# Patient Record
Sex: Female | Born: 1975 | Race: White | Hispanic: No | Smoking: Current every day smoker
Health system: Southern US, Community
[De-identification: ages and names within clinical notes are randomized; demographics above are authoritative.]

## PROBLEM LIST (undated history)

## (undated) VITALS — BP 113/76 | HR 108 | Temp 96.6°F | Resp 20 | Ht 62.0 in | Wt 150.0 lb

## (undated) DIAGNOSIS — Z87442 Personal history of urinary calculi: Secondary | ICD-10-CM

## (undated) DIAGNOSIS — F1011 Alcohol abuse, in remission: Secondary | ICD-10-CM

## (undated) DIAGNOSIS — F1111 Opioid abuse, in remission: Secondary | ICD-10-CM

## (undated) DIAGNOSIS — M545 Low back pain, unspecified: Secondary | ICD-10-CM

## (undated) DIAGNOSIS — N926 Irregular menstruation, unspecified: Secondary | ICD-10-CM

## (undated) DIAGNOSIS — F429 Obsessive-compulsive disorder, unspecified: Secondary | ICD-10-CM

## (undated) DIAGNOSIS — R51 Headache: Secondary | ICD-10-CM

## (undated) DIAGNOSIS — F411 Generalized anxiety disorder: Secondary | ICD-10-CM

## (undated) DIAGNOSIS — N83209 Unspecified ovarian cyst, unspecified side: Secondary | ICD-10-CM

## (undated) DIAGNOSIS — N200 Calculus of kidney: Secondary | ICD-10-CM

## (undated) DIAGNOSIS — G8929 Other chronic pain: Secondary | ICD-10-CM

## (undated) DIAGNOSIS — N201 Calculus of ureter: Secondary | ICD-10-CM

## (undated) DIAGNOSIS — F329 Major depressive disorder, single episode, unspecified: Secondary | ICD-10-CM

## (undated) DIAGNOSIS — N281 Cyst of kidney, acquired: Secondary | ICD-10-CM

## (undated) DIAGNOSIS — F419 Anxiety disorder, unspecified: Secondary | ICD-10-CM

## (undated) DIAGNOSIS — F32A Depression, unspecified: Secondary | ICD-10-CM

## (undated) DIAGNOSIS — D649 Anemia, unspecified: Secondary | ICD-10-CM

## (undated) DIAGNOSIS — M25561 Pain in right knee: Secondary | ICD-10-CM

## (undated) HISTORY — PX: MULTIPLE TOOTH EXTRACTIONS: SHX2053

## (undated) HISTORY — PX: WISDOM TOOTH EXTRACTION: SHX21

---

## 2003-11-19 ENCOUNTER — Inpatient Hospital Stay (HOSPITAL_COMMUNITY): Admission: AD | Admit: 2003-11-19 | Discharge: 2003-11-19 | Payer: Self-pay | Admitting: *Deleted

## 2005-03-28 ENCOUNTER — Emergency Department (HOSPITAL_COMMUNITY): Admission: EM | Admit: 2005-03-28 | Discharge: 2005-03-28 | Payer: Self-pay | Admitting: Emergency Medicine

## 2005-08-01 ENCOUNTER — Emergency Department (HOSPITAL_COMMUNITY): Admission: EM | Admit: 2005-08-01 | Discharge: 2005-08-01 | Payer: Self-pay | Admitting: Emergency Medicine

## 2005-08-02 ENCOUNTER — Emergency Department (HOSPITAL_COMMUNITY): Admission: EM | Admit: 2005-08-02 | Discharge: 2005-08-03 | Payer: Self-pay | Admitting: Emergency Medicine

## 2005-09-12 ENCOUNTER — Emergency Department (HOSPITAL_COMMUNITY): Admission: EM | Admit: 2005-09-12 | Discharge: 2005-09-12 | Payer: Self-pay | Admitting: Emergency Medicine

## 2005-12-14 ENCOUNTER — Emergency Department (HOSPITAL_COMMUNITY): Admission: EM | Admit: 2005-12-14 | Discharge: 2005-12-14 | Payer: Self-pay | Admitting: Emergency Medicine

## 2006-02-25 ENCOUNTER — Emergency Department (HOSPITAL_COMMUNITY): Admission: EM | Admit: 2006-02-25 | Discharge: 2006-02-25 | Payer: Self-pay | Admitting: Emergency Medicine

## 2006-03-05 ENCOUNTER — Emergency Department (HOSPITAL_COMMUNITY): Admission: EM | Admit: 2006-03-05 | Discharge: 2006-03-05 | Payer: Self-pay | Admitting: Emergency Medicine

## 2007-12-28 ENCOUNTER — Emergency Department (HOSPITAL_COMMUNITY): Admission: EM | Admit: 2007-12-28 | Discharge: 2007-12-28 | Payer: Self-pay | Admitting: Emergency Medicine

## 2008-03-07 ENCOUNTER — Emergency Department (HOSPITAL_COMMUNITY): Admission: EM | Admit: 2008-03-07 | Discharge: 2008-03-08 | Payer: Self-pay | Admitting: Emergency Medicine

## 2008-03-08 ENCOUNTER — Emergency Department (HOSPITAL_COMMUNITY): Admission: EM | Admit: 2008-03-08 | Discharge: 2008-03-08 | Payer: Self-pay | Admitting: Emergency Medicine

## 2008-03-14 ENCOUNTER — Ambulatory Visit (HOSPITAL_COMMUNITY): Admission: RE | Admit: 2008-03-14 | Discharge: 2008-03-14 | Payer: Self-pay | Admitting: Urology

## 2008-03-14 HISTORY — PX: OTHER SURGICAL HISTORY: SHX169

## 2008-03-18 ENCOUNTER — Emergency Department (HOSPITAL_COMMUNITY): Admission: EM | Admit: 2008-03-18 | Discharge: 2008-03-19 | Payer: Self-pay | Admitting: Emergency Medicine

## 2008-04-07 ENCOUNTER — Emergency Department (HOSPITAL_COMMUNITY): Admission: EM | Admit: 2008-04-07 | Discharge: 2008-04-07 | Payer: Self-pay | Admitting: Emergency Medicine

## 2008-04-25 ENCOUNTER — Emergency Department (HOSPITAL_COMMUNITY): Admission: EM | Admit: 2008-04-25 | Discharge: 2008-04-25 | Payer: Self-pay | Admitting: Emergency Medicine

## 2008-07-22 ENCOUNTER — Emergency Department (HOSPITAL_COMMUNITY): Admission: EM | Admit: 2008-07-22 | Discharge: 2008-07-22 | Payer: Self-pay | Admitting: Emergency Medicine

## 2008-10-27 ENCOUNTER — Emergency Department (HOSPITAL_COMMUNITY): Admission: EM | Admit: 2008-10-27 | Discharge: 2008-10-28 | Payer: Self-pay | Admitting: Emergency Medicine

## 2008-12-02 ENCOUNTER — Emergency Department (HOSPITAL_COMMUNITY): Admission: EM | Admit: 2008-12-02 | Discharge: 2008-12-02 | Payer: Self-pay | Admitting: Emergency Medicine

## 2009-02-14 ENCOUNTER — Inpatient Hospital Stay (HOSPITAL_COMMUNITY): Admission: AD | Admit: 2009-02-14 | Discharge: 2009-02-18 | Payer: Self-pay | Admitting: Psychiatry

## 2009-02-14 ENCOUNTER — Emergency Department (HOSPITAL_COMMUNITY): Admission: EM | Admit: 2009-02-14 | Discharge: 2009-02-14 | Payer: Self-pay | Admitting: Emergency Medicine

## 2009-02-14 ENCOUNTER — Ambulatory Visit: Payer: Self-pay | Admitting: Psychiatry

## 2009-05-13 ENCOUNTER — Inpatient Hospital Stay (HOSPITAL_COMMUNITY): Admission: EM | Admit: 2009-05-13 | Discharge: 2009-05-18 | Payer: Self-pay | Admitting: Emergency Medicine

## 2009-05-13 HISTORY — PX: LAPAROSCOPIC CHOLECYSTECTOMY: SUR755

## 2009-05-14 ENCOUNTER — Encounter (INDEPENDENT_AMBULATORY_CARE_PROVIDER_SITE_OTHER): Payer: Self-pay | Admitting: Internal Medicine

## 2009-07-13 ENCOUNTER — Emergency Department (HOSPITAL_COMMUNITY): Admission: EM | Admit: 2009-07-13 | Discharge: 2009-07-13 | Payer: Self-pay | Admitting: Emergency Medicine

## 2009-07-18 ENCOUNTER — Inpatient Hospital Stay (HOSPITAL_COMMUNITY): Admission: EM | Admit: 2009-07-18 | Discharge: 2009-07-20 | Payer: Self-pay | Admitting: Emergency Medicine

## 2009-07-18 HISTORY — PX: OTHER SURGICAL HISTORY: SHX169

## 2010-06-05 LAB — CBC
HCT: 29.1 % — ABNORMAL LOW (ref 36.0–46.0)
HCT: 32.8 % — ABNORMAL LOW (ref 36.0–46.0)
HCT: 35 % — ABNORMAL LOW (ref 36.0–46.0)
Hemoglobin: 10 g/dL — ABNORMAL LOW (ref 12.0–15.0)
MCHC: 34.4 g/dL (ref 30.0–36.0)
MCV: 85.9 fL (ref 78.0–100.0)
Platelets: 266 10*3/uL (ref 150–400)
Platelets: 309 10*3/uL (ref 150–400)
Platelets: 316 10*3/uL (ref 150–400)
RDW: 19.4 % — ABNORMAL HIGH (ref 11.5–15.5)
RDW: 19.5 % — ABNORMAL HIGH (ref 11.5–15.5)
WBC: 7.7 10*3/uL (ref 4.0–10.5)
WBC: 7.4 10*3/uL (ref 4.0–10.5)

## 2010-06-05 LAB — RAPID URINE DRUG SCREEN, HOSP PERFORMED
Amphetamines: NOT DETECTED
Amphetamines: NOT DETECTED
Cocaine: POSITIVE — AB
Cocaine: POSITIVE — AB
Opiates: POSITIVE — AB
Tetrahydrocannabinol: POSITIVE — AB
Tetrahydrocannabinol: POSITIVE — AB

## 2010-06-05 LAB — COMPREHENSIVE METABOLIC PANEL
ALT: 34 U/L (ref 0–35)
AST: 36 U/L (ref 0–37)
Albumin: 3.3 g/dL — ABNORMAL LOW (ref 3.5–5.2)
Calcium: 8.8 mg/dL (ref 8.4–10.5)
Chloride: 108 mEq/L (ref 96–112)
Creatinine, Ser: 0.79 mg/dL (ref 0.4–1.2)
GFR calc Af Amer: >60 mL/min (ref >60)
GFR calc non Af Amer: >60 mL/min (ref >60)
Glucose, Bld: 114 mg/dL — ABNORMAL HIGH (ref 70–99)
Potassium: 2.7 mEq/L — CL (ref 3.5–5.1)
Sodium: 141 mEq/L (ref 135–145)
Total Bilirubin: 0.4 mg/dL (ref 0.3–1.2)
Total Protein: 6.6 g/dL (ref 6.0–8.3)

## 2010-06-05 LAB — BASIC METABOLIC PANEL
BUN: 4 mg/dL — ABNORMAL LOW (ref 6–23)
BUN: 15 mg/dL (ref 6–23)
CO2: 28 mEq/L (ref 19–32)
Calcium: 8.2 mg/dL — ABNORMAL LOW (ref 8.4–10.5)
Calcium: 9.7 mg/dL (ref 8.4–10.5)
Chloride: 107 mEq/L (ref 96–112)
Creatinine, Ser: 0.72 mg/dL (ref 0.4–1.2)
Creatinine, Ser: 0.93 mg/dL (ref 0.4–1.2)
GFR calc Af Amer: >60 mL/min (ref >60)
GFR calc non Af Amer: >60 mL/min (ref >60)
GFR calc non Af Amer: >60 mL/min (ref >60)
Glucose, Bld: 105 mg/dL — ABNORMAL HIGH (ref 70–99)
Potassium: 3.2 mEq/L — ABNORMAL LOW (ref 3.5–5.1)
Potassium: 3.8 mEq/L (ref 3.5–5.1)

## 2010-06-05 LAB — ETHANOL: Alcohol, Ethyl (B): 6 mg/dL (ref 0–10)

## 2010-06-05 LAB — URINALYSIS, ROUTINE W REFLEX MICROSCOPIC
Ketones, ur: NEGATIVE mg/dL
pH: 5 (ref 5.0–8.0)

## 2010-06-05 LAB — DIFFERENTIAL
Lymphocytes Relative: 39 % (ref 12–46)
Lymphs Abs: 2.9 10*3/uL (ref 0.7–4.0)
Neutro Abs: 3.7 10*3/uL (ref 1.7–7.7)
Neutrophils Relative %: 49 % (ref 43–77)

## 2010-06-05 LAB — TRICYCLICS SCREEN, URINE: TCA Scrn: NOT DETECTED

## 2010-06-05 LAB — MRSA PCR SCREENING: MRSA by PCR: NEGATIVE

## 2010-06-05 LAB — PROTIME-INR: INR: 1.03 (ref 0.00–1.49)

## 2010-06-06 LAB — POCT I-STAT, CHEM 8
BUN: 19 mg/dL (ref 6–23)
Calcium, Ion: 1.09 mmol/L — ABNORMAL LOW (ref 1.12–1.32)
Creatinine, Ser: 2 mg/dL — ABNORMAL HIGH (ref 0.4–1.2)
Glucose, Bld: 163 mg/dL — ABNORMAL HIGH (ref 70–99)
Hemoglobin: 8.5 g/dL — ABNORMAL LOW (ref 12.0–15.0)
Sodium: 138 mEq/L (ref 135–145)
TCO2: 20 mmol/L (ref 0–100)

## 2010-06-06 LAB — CBC
HCT: 25.6 % — ABNORMAL LOW (ref 36.0–46.0)
HCT: 27.1 % — ABNORMAL LOW (ref 36.0–46.0)
HCT: 27.2 % — ABNORMAL LOW (ref 36.0–46.0)
HCT: 32.7 % — ABNORMAL LOW (ref 36.0–46.0)
Hemoglobin: 9.1 g/dL — ABNORMAL LOW (ref 12.0–15.0)
MCHC: 32 g/dL (ref 30.0–36.0)
MCHC: 33.6 g/dL (ref 30.0–36.0)
MCV: 82.3 fL (ref 78.0–100.0)
MCV: 82.9 fL (ref 78.0–100.0)
Platelets: 233 10*3/uL (ref 150–400)
Platelets: 243 10*3/uL (ref 150–400)
RBC: 3.26 MIL/uL — ABNORMAL LOW (ref 3.87–5.11)
RBC: 3.28 MIL/uL — ABNORMAL LOW (ref 3.87–5.11)
RBC: 3.94 MIL/uL (ref 3.87–5.11)
RDW: 20.1 % — ABNORMAL HIGH (ref 11.5–15.5)
WBC: 12.8 10*3/uL — ABNORMAL HIGH (ref 4.0–10.5)
WBC: 18.9 10*3/uL — ABNORMAL HIGH (ref 4.0–10.5)

## 2010-06-06 LAB — URINALYSIS, ROUTINE W REFLEX MICROSCOPIC
Bilirubin Urine: NEGATIVE
Bilirubin Urine: NEGATIVE
Glucose, UA: NEGATIVE mg/dL
Nitrite: NEGATIVE
Protein, ur: 100 mg/dL — AB
Protein, ur: 30 mg/dL — AB
Specific Gravity, Urine: 1.012 (ref 1.005–1.030)
Urobilinogen, UA: 0.2 mg/dL (ref 0.0–1.0)
Urobilinogen, UA: 1 mg/dL (ref 0.0–1.0)

## 2010-06-06 LAB — BASIC METABOLIC PANEL
BUN: 24 mg/dL — ABNORMAL HIGH (ref 6–23)
CO2: 24 mEq/L (ref 19–32)
Chloride: 100 mEq/L (ref 96–112)
Chloride: 104 mEq/L (ref 96–112)
Creatinine, Ser: 2.18 mg/dL — ABNORMAL HIGH (ref 0.4–1.2)
GFR calc Af Amer: 34 mL/min — ABNORMAL LOW (ref >60)
GFR calc non Af Amer: 28 mL/min — ABNORMAL LOW (ref >60)
Glucose, Bld: 131 mg/dL — ABNORMAL HIGH (ref 70–99)
Potassium: 3 mEq/L — ABNORMAL LOW (ref 3.5–5.1)
Potassium: 3.2 mEq/L — ABNORMAL LOW (ref 3.5–5.1)
Sodium: 136 mEq/L (ref 135–145)

## 2010-06-06 LAB — DIFFERENTIAL
Basophils Absolute: 0 10*3/uL (ref 0.0–0.1)
Eosinophils Absolute: 0 10*3/uL (ref 0.0–0.7)
Eosinophils Absolute: 0 10*3/uL (ref 0.0–0.7)
Eosinophils Relative: 0 % (ref 0–5)
Lymphocytes Relative: 3 % — ABNORMAL LOW (ref 12–46)
Lymphs Abs: 0.6 10*3/uL — ABNORMAL LOW (ref 0.7–4.0)
Lymphs Abs: 0.6 10*3/uL — ABNORMAL LOW (ref 0.7–4.0)
Monocytes Absolute: 0.5 10*3/uL (ref 0.1–1.0)
Monocytes Relative: 3 % (ref 3–12)
Neutro Abs: 11.7 10*3/uL — ABNORMAL HIGH (ref 1.7–7.7)

## 2010-06-06 LAB — COMPREHENSIVE METABOLIC PANEL
AST: 41 U/L — ABNORMAL HIGH (ref 0–37)
Albumin: 1.9 g/dL — ABNORMAL LOW (ref 3.5–5.2)
Albumin: 2.1 g/dL — ABNORMAL LOW (ref 3.5–5.2)
Alkaline Phosphatase: 101 U/L (ref 39–117)
BUN: 13 mg/dL (ref 6–23)
CO2: 20 mEq/L (ref 19–32)
Calcium: 7.6 mg/dL — ABNORMAL LOW (ref 8.4–10.5)
Chloride: 108 mEq/L (ref 96–112)
Chloride: 110 mEq/L (ref 96–112)
Creatinine, Ser: 1.51 mg/dL — ABNORMAL HIGH (ref 0.4–1.2)
GFR calc Af Amer: 45 mL/min — ABNORMAL LOW (ref >60)
GFR calc non Af Amer: 37 mL/min — ABNORMAL LOW (ref >60)
Potassium: 3.9 mEq/L (ref 3.5–5.1)
Total Bilirubin: 0.5 mg/dL (ref 0.3–1.2)
Total Bilirubin: 0.5 mg/dL (ref 0.3–1.2)
Total Protein: 5.1 g/dL — ABNORMAL LOW (ref 6.0–8.3)

## 2010-06-06 LAB — RAPID URINE DRUG SCREEN, HOSP PERFORMED
Amphetamines: NOT DETECTED
Benzodiazepines: POSITIVE — AB
Opiates: POSITIVE — AB
Tetrahydrocannabinol: NOT DETECTED

## 2010-06-06 LAB — URINE CULTURE: Colony Count: >=100000

## 2010-06-06 LAB — IRON: Iron: <10 ug/dL — ABNORMAL LOW (ref 42–135)

## 2010-06-06 LAB — RAPID STREP SCREEN (MED CTR MEBANE ONLY): Streptococcus, Group A Screen (Direct): NEGATIVE

## 2010-06-06 LAB — URINE MICROSCOPIC-ADD ON

## 2010-06-06 LAB — ETHANOL: Alcohol, Ethyl (B): <5 mg/dL (ref 0–10)

## 2010-06-06 LAB — POCT PREGNANCY, URINE: Preg Test, Ur: NEGATIVE

## 2010-06-10 LAB — CULTURE, BLOOD (ROUTINE X 2): Culture: NO GROWTH

## 2010-06-10 LAB — BASIC METABOLIC PANEL
BUN: 8 mg/dL (ref 6–23)
BUN: 9 mg/dL (ref 6–23)
CO2: 24 mEq/L (ref 19–32)
Calcium: 8.5 mg/dL (ref 8.4–10.5)
Calcium: 9.1 mg/dL (ref 8.4–10.5)
Chloride: 111 mEq/L (ref 96–112)
Creatinine, Ser: 0.92 mg/dL (ref 0.4–1.2)
Creatinine, Ser: 1.13 mg/dL (ref 0.4–1.2)
Creatinine, Ser: 1.14 mg/dL (ref 0.4–1.2)
GFR calc Af Amer: >60 mL/min (ref >60)
GFR calc non Af Amer: 55 mL/min — ABNORMAL LOW (ref >60)
GFR calc non Af Amer: >60 mL/min (ref >60)
Glucose, Bld: 137 mg/dL — ABNORMAL HIGH (ref 70–99)
Glucose, Bld: 144 mg/dL — ABNORMAL HIGH (ref 70–99)
Potassium: 2.6 mEq/L — CL (ref 3.5–5.1)
Potassium: 3.5 mEq/L (ref 3.5–5.1)

## 2010-06-10 LAB — CBC
HCT: 24.2 % — ABNORMAL LOW (ref 36.0–46.0)
HCT: 24.4 % — ABNORMAL LOW (ref 36.0–46.0)
MCHC: 33.3 g/dL (ref 30.0–36.0)
MCV: 82.8 fL (ref 78.0–100.0)
Platelets: 264 10*3/uL (ref 150–400)
Platelets: 288 10*3/uL (ref 150–400)
RBC: 3.54 MIL/uL — ABNORMAL LOW (ref 3.87–5.11)
RDW: 20.1 % — ABNORMAL HIGH (ref 11.5–15.5)
RDW: 20.1 % — ABNORMAL HIGH (ref 11.5–15.5)
WBC: 9.4 10*3/uL (ref 4.0–10.5)

## 2010-06-10 LAB — FECAL LACTOFERRIN, QUANT: Fecal Lactoferrin: NEGATIVE

## 2010-06-10 LAB — DIFFERENTIAL
Basophils Relative: 0 % (ref 0–1)
Lymphs Abs: 2.9 10*3/uL (ref 0.7–4.0)
Monocytes Relative: 7 % (ref 3–12)
Neutro Abs: 5.7 10*3/uL (ref 1.7–7.7)
Neutrophils Relative %: 61 % (ref 43–77)

## 2010-06-10 LAB — CLOSTRIDIUM DIFFICILE EIA

## 2010-06-10 LAB — IRON AND TIBC

## 2010-06-10 LAB — VITAMIN B12: Vitamin B-12: 737 pg/mL (ref 211–911)

## 2010-06-10 LAB — STOOL CULTURE

## 2010-06-10 LAB — MAGNESIUM: Magnesium: 1.7 mg/dL (ref 1.5–2.5)

## 2010-06-19 LAB — HEPATIC FUNCTION PANEL
ALT: 32 U/L (ref 0–35)
Albumin: 3.6 g/dL (ref 3.5–5.2)
Alkaline Phosphatase: 77 U/L (ref 39–117)
Total Protein: 7 g/dL (ref 6.0–8.3)

## 2010-06-20 LAB — DIFFERENTIAL
Basophils Absolute: 0.1 10*3/uL (ref 0.0–0.1)
Eosinophils Relative: 1 % (ref 0–5)
Lymphocytes Relative: 35 % (ref 12–46)
Neutro Abs: 4.5 10*3/uL (ref 1.7–7.7)
Neutrophils Relative %: 57 % (ref 43–77)

## 2010-06-20 LAB — BASIC METABOLIC PANEL
BUN: 6 mg/dL (ref 6–23)
Chloride: 106 mEq/L (ref 96–112)
Potassium: 3.6 mEq/L (ref 3.5–5.1)

## 2010-06-20 LAB — CBC
Platelets: 365 10*3/uL (ref 150–400)
RDW: 20.7 % — ABNORMAL HIGH (ref 11.5–15.5)

## 2010-06-20 LAB — ETHANOL: Alcohol, Ethyl (B): 214 mg/dL — ABNORMAL HIGH (ref 0–10)

## 2010-06-20 LAB — RAPID URINE DRUG SCREEN, HOSP PERFORMED
Barbiturates: NOT DETECTED
Benzodiazepines: POSITIVE — AB
Cocaine: NOT DETECTED

## 2010-06-22 LAB — POCT PREGNANCY, URINE: Preg Test, Ur: NEGATIVE

## 2010-06-23 LAB — URINALYSIS, ROUTINE W REFLEX MICROSCOPIC
Nitrite: NEGATIVE
Specific Gravity, Urine: 1.034 — ABNORMAL HIGH (ref 1.005–1.030)
pH: 5.5 (ref 5.0–8.0)

## 2010-06-23 LAB — POCT I-STAT, CHEM 8
BUN: 16 mg/dL (ref 6–23)
Chloride: 104 mEq/L (ref 96–112)
Creatinine, Ser: 0.7 mg/dL (ref 0.4–1.2)
Potassium: 3.7 mEq/L (ref 3.5–5.1)
Sodium: 137 mEq/L (ref 135–145)

## 2010-06-23 LAB — ETHANOL: Alcohol, Ethyl (B): <5 mg/dL (ref 0–10)

## 2010-06-23 LAB — CBC
HCT: 36.1 % (ref 36.0–46.0)
Platelets: 281 10*3/uL (ref 150–400)
WBC: 7.9 10*3/uL (ref 4.0–10.5)

## 2010-06-23 LAB — DIFFERENTIAL
Lymphocytes Relative: 31 % (ref 12–46)
Lymphs Abs: 2.4 10*3/uL (ref 0.7–4.0)
Neutro Abs: 4.8 10*3/uL (ref 1.7–7.7)
Neutrophils Relative %: 60 % (ref 43–77)

## 2010-06-26 LAB — URINALYSIS, ROUTINE W REFLEX MICROSCOPIC
Glucose, UA: NEGATIVE mg/dL
Specific Gravity, Urine: 1.016 (ref 1.005–1.030)

## 2010-06-26 LAB — DIFFERENTIAL
Basophils Relative: 1 % (ref 0–1)
Monocytes Relative: 7 % (ref 3–12)
Neutro Abs: 3.4 10*3/uL (ref 1.7–7.7)
Neutrophils Relative %: 61 % (ref 43–77)

## 2010-06-26 LAB — CBC
MCHC: 34 g/dL (ref 30.0–36.0)
Platelets: 286 10*3/uL (ref 150–400)
RBC: 4.37 MIL/uL (ref 3.87–5.11)
WBC: 5.6 10*3/uL (ref 4.0–10.5)

## 2010-06-26 LAB — URINE CULTURE: Colony Count: >=100000

## 2010-06-26 LAB — URINE MICROSCOPIC-ADD ON

## 2010-06-26 LAB — BASIC METABOLIC PANEL
BUN: 6 mg/dL (ref 6–23)
CO2: 27 mEq/L (ref 19–32)
Calcium: 9.5 mg/dL (ref 8.4–10.5)
Creatinine, Ser: 0.67 mg/dL (ref 0.4–1.2)
GFR calc Af Amer: >60 mL/min (ref >60)

## 2010-07-02 LAB — URINALYSIS, ROUTINE W REFLEX MICROSCOPIC
Glucose, UA: NEGATIVE mg/dL
Ketones, ur: NEGATIVE mg/dL
Nitrite: NEGATIVE
Protein, ur: 30 mg/dL — AB
Protein, ur: NEGATIVE mg/dL
Specific Gravity, Urine: 1.027 (ref 1.005–1.030)
Urobilinogen, UA: 1 mg/dL (ref 0.0–1.0)
pH: 5.5 (ref 5.0–8.0)

## 2010-07-02 LAB — URINE MICROSCOPIC-ADD ON

## 2010-07-02 LAB — POCT PREGNANCY, URINE: Preg Test, Ur: NEGATIVE

## 2010-07-03 LAB — URINALYSIS, ROUTINE W REFLEX MICROSCOPIC
Bilirubin Urine: NEGATIVE
Hgb urine dipstick: NEGATIVE
Nitrite: NEGATIVE
Specific Gravity, Urine: 1.018 (ref 1.005–1.030)
Urobilinogen, UA: 0.2 mg/dL (ref 0.0–1.0)
pH: 7 (ref 5.0–8.0)

## 2010-07-03 LAB — POCT PREGNANCY, URINE: Preg Test, Ur: NEGATIVE

## 2010-07-31 NOTE — Op Note (Signed)
NAME:  Shannon Dawson, Shannon Dawson                ACCOUNT NO.:  [id number]   MEDICAL RECORD NO.:  [id number]          PATIENT TYPE:  AMB   LOCATION:  DAY                          FACILITY:  [facility]   PHYSICIAN:  Sigmund I. Patsi Sears, M.D.DATE OF BIRTH:  [birth year]   DATE OF PROCEDURE:  03/14/2008  DATE OF DISCHARGE:                               OPERATIVE REPORT   PREOPERATIVE DIAGNOSIS:  Right ureteral calculus.   POSTOPERATIVE DIAGNOSIS:  Right ureteral calculus.   OPERATION:  Cystourethroscopy, right retrograde pyelogram with  interpretation, ureteroscopy, laser fractionation of right ureteral  calculus, basket extraction of right ureteral calculus.   SURGEON:  Sigmund I. Patsi Sears, M.D.   ANESTHESIA:  General endotracheal.   PREPARATION:  After appropriate preanesthesia, the patient was brought  to the operating room and placed on the operating table in the dorsal  supine position where general LMA anesthesia was induced.  She was  replaced in the dorsal lithotomy position.  The pubis was prepped with  Betadine solution in the usual fashion.  An oral gastric tube was placed  to alleviate gas in the stomach, and food contents were returned.  It  was felt that the patient may have eaten prior to surgery, and  therefore, general endotracheal anesthesia was then administered, and  continued aspiration of the stomach contents with orogastric tube.   REVIEW OF HISTORY:  This 35 year old female has a history of a 4-mm  right upper ureteral calculus, which has caused continued pain, has not  passed over the last several days and is now for basket extraction and  laser fractionation of necessary.   PROCEDURE:  Cystourethroscopy was accomplished and shows a normal-  appearing bladder.  Right retrograde pyelogram was performed which shows  a possible stone in the right mid ureter, with dilation of the ureter  above the level of the possible stone.  Ureteroscopy was accomplished,  which indeed does  show a stone in the right mid ureter.  The stone is  multilobulated, and it was elected to begin laser fractionation of the  stone.  It was felt that the stone would probably not pass further  distalward, because it has now been over 1 week and the stone does not  appear to have progressed significantly since original diagnostic CT  scan showed the stone at the UPJ.  The stone may have progressed into  the mid ureter, however.  Following retrograde pyelogram interpretation,  right ureteroscopy was accomplished, the stone identified.  Laser  fractionation of the stone was accomplished, and the stone fragments  were pulled from the ureter.  In the lower ureter, the ureter was noted  to be quite edematous, and narrowed.  I elected to place a double-J  catheter, and placed a 6-French x 24-cm double-J catheter, coiled in the  renal pelvis, and in the  bladder.  The suture was left on the bladder coil, so that the patient  may remove the stent herself.  The patient was given IV Toradol prior to  awakening.  In addition, she was given a B and O suppository at the  beginning of  the procedure.  She was awakened and taken to the recovery  room in excellent condition.      Sigmund I. Patsi Sears, M.D.  Electronically Signed     SIT/MEDQ  D:  03/14/2008  T:  03/14/2008  Job:  [id number]

## 2010-12-17 LAB — URINALYSIS, ROUTINE W REFLEX MICROSCOPIC
Bilirubin Urine: NEGATIVE
Glucose, UA: NEGATIVE
Hgb urine dipstick: NEGATIVE
Protein, ur: NEGATIVE
Specific Gravity, Urine: 1.021

## 2010-12-21 LAB — URINALYSIS, ROUTINE W REFLEX MICROSCOPIC
Glucose, UA: NEGATIVE mg/dL
Leukocytes, UA: NEGATIVE
pH: 6 (ref 5.0–8.0)

## 2010-12-21 LAB — POCT I-STAT, CHEM 8
Calcium, Ion: 1.2 mmol/L (ref 1.12–1.32)
Glucose, Bld: 104 mg/dL — ABNORMAL HIGH (ref 70–99)
HCT: 42 % (ref 36.0–46.0)
Hemoglobin: 14.3 g/dL (ref 12.0–15.0)
TCO2: 27 mmol/L (ref 0–100)

## 2010-12-21 LAB — URINE MICROSCOPIC-ADD ON

## 2010-12-21 LAB — URINE CULTURE

## 2011-02-07 IMAGING — CR DG CHEST 1V PORT
1 series · 1 of 1 positions shown · non-contrast
Comparison: No similar prior study is available for comparison.

CLINICAL DATA: Status post colectomy, postoperative exam

PORTABLE CHEST - 1 VIEW

[view not recorded]
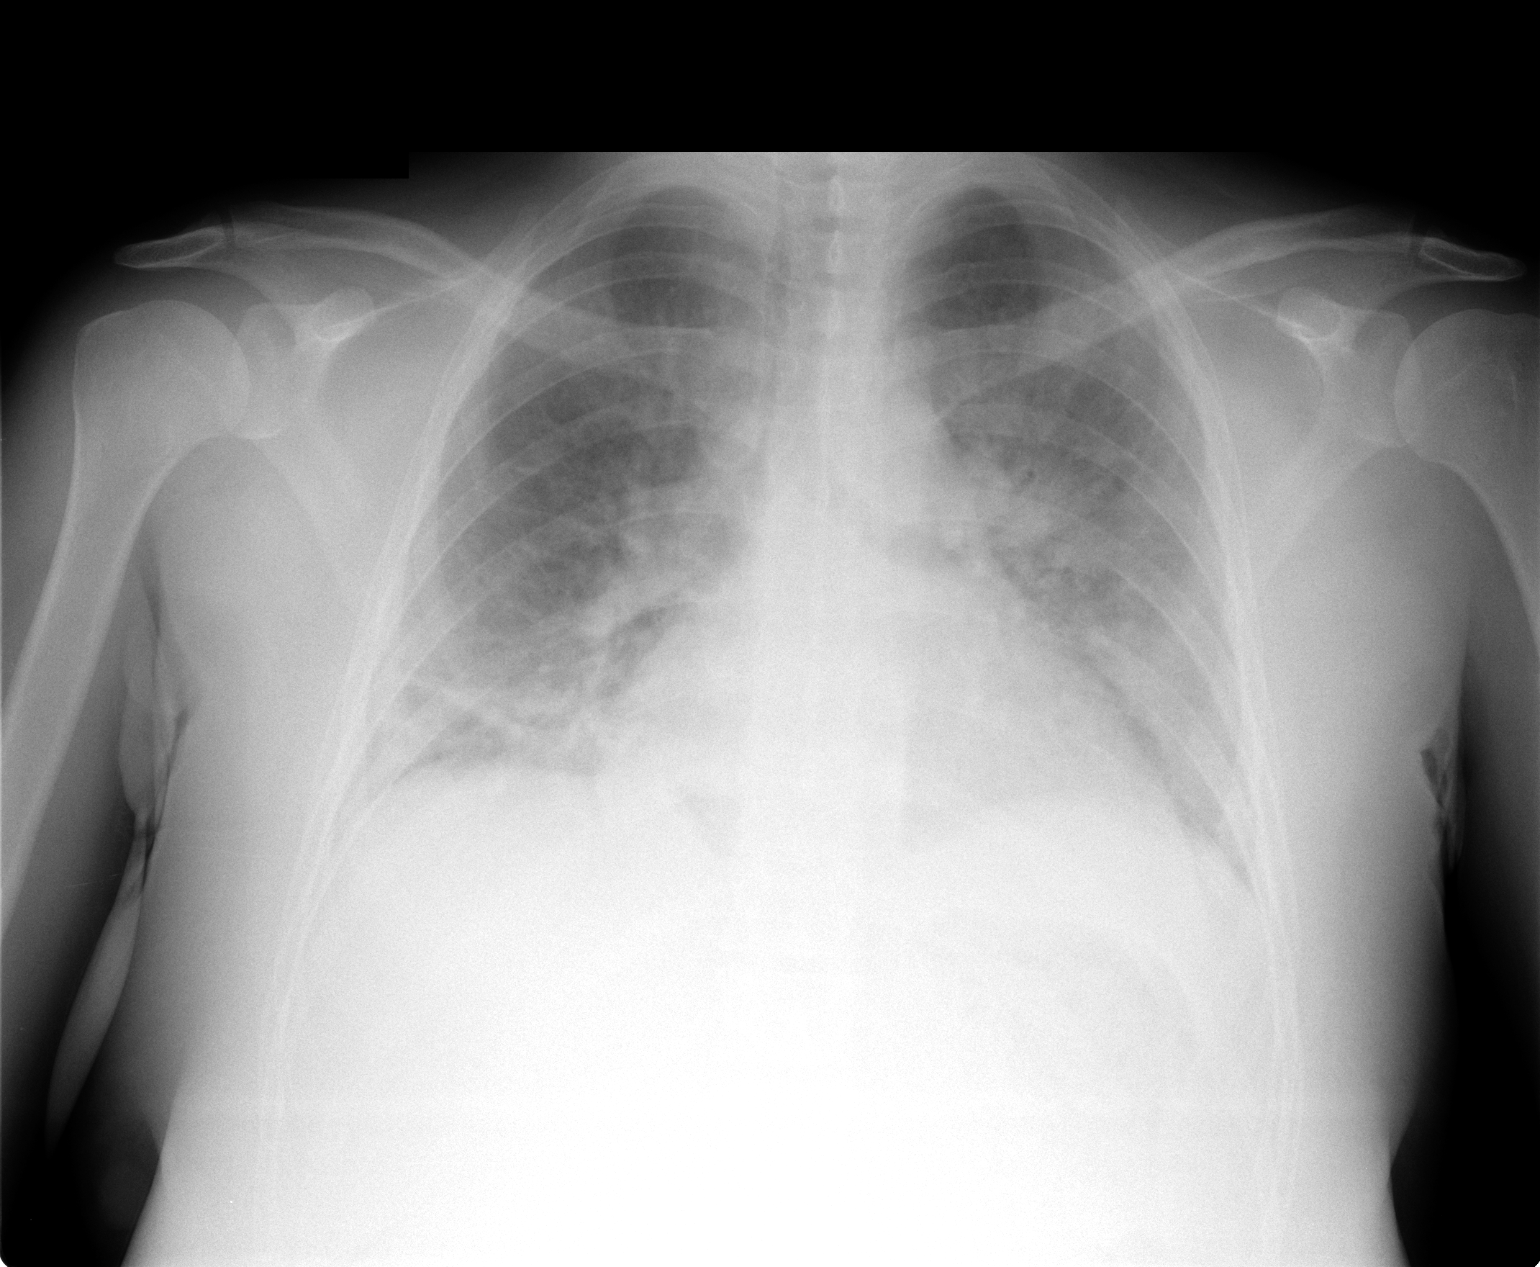

[1 of 1 positions shown; findings below may reference images not displayed]

FINDINGS: Lung volumes are low with bibasilar plate-like
atelectasis noted.  There is central vascular congestion and ill-
defined perihilar opacities.  Trace effusions may be present.
Heart size is mildly enlarged.
IMPRESSION: Mild enlargement cardiomediastinal silhouette with any suggestive
of fluid overload, although this may be exacerbated by the low lung
volumes and portable technique.  Recommend PA and lateral chest
radiographs at full inspiration when the patient is clinically
able.

## 2011-03-19 HISTORY — PX: OTHER SURGICAL HISTORY: SHX169

## 2011-08-29 ENCOUNTER — Encounter (HOSPITAL_COMMUNITY): Payer: Self-pay | Admitting: Rehabilitation

## 2011-08-29 ENCOUNTER — Inpatient Hospital Stay (HOSPITAL_COMMUNITY)
Admission: EM | Admit: 2011-08-29 | Discharge: 2011-09-06 | DRG: 897 | Disposition: A | Discharge status: Nursing Home (Includes ICF) | Payer: Medicaid Other | Source: Other Acute Inpatient Hospital | Attending: Psychiatry | Admitting: Psychiatry

## 2011-08-29 DIAGNOSIS — F4310 Post-traumatic stress disorder, unspecified: Secondary | ICD-10-CM | POA: Diagnosis present

## 2011-08-29 DIAGNOSIS — F429 Obsessive-compulsive disorder, unspecified: Secondary | ICD-10-CM | POA: Diagnosis present

## 2011-08-29 DIAGNOSIS — F1020 Alcohol dependence, uncomplicated: Secondary | ICD-10-CM | POA: Diagnosis present

## 2011-08-29 DIAGNOSIS — F10939 Alcohol use, unspecified with withdrawal, unspecified: Secondary | ICD-10-CM | POA: Diagnosis present

## 2011-08-29 DIAGNOSIS — F19939 Other psychoactive substance use, unspecified with withdrawal, unspecified: Principal | ICD-10-CM | POA: Diagnosis present

## 2011-08-29 DIAGNOSIS — F1120 Opioid dependence, uncomplicated: Secondary | ICD-10-CM | POA: Diagnosis present

## 2011-08-29 DIAGNOSIS — K089 Disorder of teeth and supporting structures, unspecified: Secondary | ICD-10-CM | POA: Diagnosis present

## 2011-08-29 DIAGNOSIS — F411 Generalized anxiety disorder: Secondary | ICD-10-CM | POA: Diagnosis present

## 2011-08-29 DIAGNOSIS — F10239 Alcohol dependence with withdrawal, unspecified: Secondary | ICD-10-CM | POA: Diagnosis present

## 2011-08-29 DIAGNOSIS — F319 Bipolar disorder, unspecified: Secondary | ICD-10-CM | POA: Diagnosis present

## 2011-08-29 DIAGNOSIS — F1420 Cocaine dependence, uncomplicated: Secondary | ICD-10-CM | POA: Diagnosis present

## 2011-08-29 HISTORY — DX: Anxiety disorder, unspecified: F41.9

## 2011-08-29 HISTORY — DX: Major depressive disorder, single episode, unspecified: F32.9

## 2011-08-29 HISTORY — DX: Headache: R51

## 2011-08-29 HISTORY — DX: Depression, unspecified: F32.A

## 2011-08-29 LAB — COMPREHENSIVE METABOLIC PANEL
ALT: 6 U/L (ref 0–35)
AST: 11 U/L (ref 0–37)
CO2: 27 mEq/L (ref 19–32)
Chloride: 100 mEq/L (ref 96–112)
Creatinine, Ser: 0.94 mg/dL (ref 0.50–1.10)
GFR calc non Af Amer: 77 mL/min — ABNORMAL LOW (ref >90)
Total Bilirubin: 0.2 mg/dL — ABNORMAL LOW (ref 0.3–1.2)

## 2011-08-29 LAB — ETHANOL: Alcohol, Ethyl (B): <11 mg/dL (ref 0–11)

## 2011-08-29 LAB — DIFFERENTIAL
Basophils Absolute: 0 10*3/uL (ref 0.0–0.1)
Basophils Relative: 0 % (ref 0–1)
Eosinophils Absolute: 0.2 10*3/uL (ref 0.0–0.7)
Neutrophils Relative %: 50 % (ref 43–77)

## 2011-08-29 LAB — CBC
MCH: 29.1 pg (ref 26.0–34.0)
MCHC: 33 g/dL (ref 30.0–36.0)
Platelets: 288 10*3/uL (ref 150–400)
RBC: 4.67 MIL/uL (ref 3.87–5.11)

## 2011-08-29 MED ORDER — VITAMIN B-1 100 MG PO TABS
100.0000 mg | ORAL_TABLET | Freq: Every day | ORAL | Status: DC
  Administered 2011-08-30 – 2011-09-06 (×8): 100 mg via ORAL
  Filled 2011-08-29 (×9): qty 1

## 2011-08-29 MED ORDER — CHLORDIAZEPOXIDE HCL 25 MG PO CAPS: 25.0000 mg | ORAL_CAPSULE | Freq: Four times a day (QID) | ORAL | Status: DC | PRN

## 2011-08-29 MED ORDER — CLONIDINE HCL 0.1 MG PO TABS
0.1000 mg | ORAL_TABLET | ORAL | Status: AC
  Administered 2011-08-30 – 2011-09-01 (×4): 0.1 mg via ORAL
  Filled 2011-08-29 (×4): qty 1

## 2011-08-29 MED ORDER — CHLORDIAZEPOXIDE HCL 25 MG PO CAPS: 25.0000 mg | ORAL_CAPSULE | ORAL | Status: DC

## 2011-08-29 MED ORDER — MAGNESIUM HYDROXIDE 400 MG/5ML PO SUSP
30.0000 mL | Freq: Every day | ORAL | Status: DC | PRN
  Administered 2011-09-01 – 2011-09-03 (×3): 30 mL via ORAL

## 2011-08-29 MED ORDER — QUETIAPINE FUMARATE 200 MG PO TABS
200.0000 mg | ORAL_TABLET | Freq: Every day | ORAL | Status: DC
  Administered 2011-08-29 – 2011-09-02 (×5): 200 mg via ORAL
  Filled 2011-08-29 (×8): qty 1

## 2011-08-29 MED ORDER — ALUM & MAG HYDROXIDE-SIMETH 200-200-20 MG/5ML PO SUSP
30.0000 mL | ORAL | Status: DC | PRN
  Administered 2011-09-02: 30 mL via ORAL

## 2011-08-29 MED ORDER — CHLORDIAZEPOXIDE HCL 25 MG PO CAPS: 25.0000 mg | ORAL_CAPSULE | Freq: Once | ORAL | Status: DC

## 2011-08-29 MED ORDER — CHLORDIAZEPOXIDE HCL 25 MG PO CAPS
50.0000 mg | ORAL_CAPSULE | Freq: Four times a day (QID) | ORAL | Status: AC
  Administered 2011-08-29 (×2): 50 mg via ORAL
  Filled 2011-08-29 (×2): qty 2

## 2011-08-29 MED ORDER — ONDANSETRON 4 MG PO TBDP: 4.0000 mg | ORAL_TABLET | Freq: Four times a day (QID) | ORAL | Status: DC | PRN

## 2011-08-29 MED ORDER — NAPROXEN 500 MG PO TABS
500.0000 mg | ORAL_TABLET | Freq: Two times a day (BID) | ORAL | Status: AC | PRN
  Administered 2011-08-30: 500 mg via ORAL
  Filled 2011-08-29: qty 1

## 2011-08-29 MED ORDER — NICOTINE POLACRILEX 2 MG MT GUM
2.0000 mg | CHEWING_GUM | OROMUCOSAL | Status: DC | PRN
  Administered 2011-08-29 – 2011-09-02 (×11): 2 mg via ORAL
  Filled 2011-08-29 (×4): qty 1

## 2011-08-29 MED ORDER — THIAMINE HCL 100 MG/ML IJ SOLN
100.0000 mg | Freq: Once | INTRAMUSCULAR | Status: AC
  Administered 2011-08-29: 100 mg via INTRAMUSCULAR

## 2011-08-29 MED ORDER — CHLORDIAZEPOXIDE HCL 25 MG PO CAPS
50.0000 mg | ORAL_CAPSULE | ORAL | Status: AC
  Administered 2011-08-31 (×2): 50 mg via ORAL
  Filled 2011-08-29 (×3): qty 1
  Filled 2011-08-29: qty 2

## 2011-08-29 MED ORDER — CHLORDIAZEPOXIDE HCL 25 MG PO CAPS
50.0000 mg | ORAL_CAPSULE | Freq: Three times a day (TID) | ORAL | Status: AC
  Administered 2011-08-30 (×2): 50 mg via ORAL
  Administered 2011-08-30: 25 mg via ORAL
  Filled 2011-08-29 (×2): qty 1

## 2011-08-29 MED ORDER — DICYCLOMINE HCL 20 MG PO TABS
20.0000 mg | ORAL_TABLET | Freq: Four times a day (QID) | ORAL | Status: AC | PRN
  Administered 2011-08-29 – 2011-08-31 (×2): 20 mg via ORAL
  Filled 2011-08-29 (×3): qty 1

## 2011-08-29 MED ORDER — HYDROXYZINE HCL 25 MG PO TABS
25.0000 mg | ORAL_TABLET | Freq: Four times a day (QID) | ORAL | Status: DC | PRN
  Administered 2011-08-29 – 2011-08-30 (×2): 25 mg via ORAL

## 2011-08-29 MED ORDER — ADULT MULTIVITAMIN W/MINERALS CH
1.0000 | ORAL_TABLET | Freq: Every day | ORAL | Status: DC
  Administered 2011-08-29 – 2011-09-06 (×9): 1 via ORAL
  Filled 2011-08-29 (×11): qty 1

## 2011-08-29 MED ORDER — CHLORDIAZEPOXIDE HCL 25 MG PO CAPS
50.0000 mg | ORAL_CAPSULE | Freq: Every day | ORAL | Status: AC
  Administered 2011-09-01: 50 mg via ORAL
  Filled 2011-08-29 (×2): qty 2

## 2011-08-29 MED ORDER — TRAZODONE HCL 50 MG PO TABS
50.0000 mg | ORAL_TABLET | Freq: Every evening | ORAL | Status: DC | PRN
  Administered 2011-08-30 – 2011-09-03 (×5): 50 mg via ORAL
  Filled 2011-08-29 (×6): qty 1

## 2011-08-29 MED ORDER — ONDANSETRON 4 MG PO TBDP: 4.0000 mg | ORAL_TABLET | Freq: Four times a day (QID) | ORAL | Status: AC | PRN

## 2011-08-29 MED ORDER — METHOCARBAMOL 500 MG PO TABS
500.0000 mg | ORAL_TABLET | Freq: Three times a day (TID) | ORAL | Status: DC | PRN
  Administered 2011-08-29: 500 mg via ORAL
  Filled 2011-08-29: qty 1

## 2011-08-29 MED ORDER — ACETAMINOPHEN 325 MG PO TABS
650.0000 mg | ORAL_TABLET | Freq: Four times a day (QID) | ORAL | Status: DC | PRN
  Administered 2011-08-31 – 2011-09-03 (×5): 650 mg via ORAL
  Filled 2011-08-29: qty 2

## 2011-08-29 MED ORDER — LOPERAMIDE HCL 2 MG PO CAPS
2.0000 mg | ORAL_CAPSULE | ORAL | Status: AC | PRN
  Administered 2011-08-31: 2 mg via ORAL

## 2011-08-29 MED ORDER — CLONIDINE HCL 0.1 MG PO TABS
0.1000 mg | ORAL_TABLET | Freq: Every day | ORAL | Status: AC
  Administered 2011-09-02 – 2011-09-03 (×2): 0.1 mg via ORAL
  Filled 2011-08-29 (×2): qty 1

## 2011-08-29 MED ORDER — CHLORDIAZEPOXIDE HCL 25 MG PO CAPS: 25.0000 mg | ORAL_CAPSULE | Freq: Every day | ORAL | Status: DC

## 2011-08-29 MED ORDER — METHOCARBAMOL 500 MG PO TABS
1000.0000 mg | ORAL_TABLET | Freq: Three times a day (TID) | ORAL | Status: AC | PRN
  Administered 2011-08-30 – 2011-09-02 (×6): 1000 mg via ORAL
  Filled 2011-08-29: qty 1
  Filled 2011-08-29: qty 2
  Filled 2011-08-29: qty 1
  Filled 2011-08-29 (×3): qty 2
  Filled 2011-08-29: qty 1

## 2011-08-29 MED ORDER — LOPERAMIDE HCL 2 MG PO CAPS: 2.0000 mg | ORAL_CAPSULE | ORAL | Status: DC | PRN

## 2011-08-29 MED ORDER — HYDROXYZINE HCL 25 MG PO TABS: 25.0000 mg | ORAL_TABLET | Freq: Four times a day (QID) | ORAL | Status: DC | PRN

## 2011-08-29 MED ORDER — CLONIDINE HCL 0.1 MG PO TABS
0.1000 mg | ORAL_TABLET | Freq: Four times a day (QID) | ORAL | Status: AC
  Administered 2011-08-29 – 2011-08-30 (×7): 0.1 mg via ORAL
  Filled 2011-08-29 (×7): qty 1

## 2011-08-29 MED ORDER — CHLORDIAZEPOXIDE HCL 25 MG PO CAPS: 25.0000 mg | ORAL_CAPSULE | Freq: Three times a day (TID) | ORAL | Status: DC

## 2011-08-29 MED ORDER — CHLORDIAZEPOXIDE HCL 25 MG PO CAPS
25.0000 mg | ORAL_CAPSULE | Freq: Four times a day (QID) | ORAL | Status: DC
  Administered 2011-08-29: 25 mg via ORAL
  Filled 2011-08-29: qty 1

## 2011-08-29 NOTE — [facility] Suicide Risk Assessment (Signed)
Suicide Risk Assessment  Admission Assessment      Demographic factors: See chart.  Current Mental Status:  Patient seen and evaluated. Chart reviewed. Patient stated that her mood was "not good". Her affect was mood congruent and anxious. Sig w/d noted. She denied any current thoughts of self injurious behavior, suicidal ideation or homicidal ideation. There were no auditory or visual hallucinations, paranoia, delusional thought processes, or mania noted.  Thought process was linear and goal directed.  Sig psychomotor agitation was noted. Speech was normal rate, tone and volume. Eye contact was good. Judgment and insight are fair.  Patient has been up and engaged on the unit.  No acute safety concerns reported from team.    Loss Factors:  Homeless; poor social support; difficulty accessing medical care and paying for meds  Historical Factors:  Lithium (c/o sedation); Seroquel; Ativan in past; denied past Hx SI/attempts/plans  Risk Reduction Factors:  Motivated for Tx  CLINICAL FACTORS: Alcohol & Opioid Dependence with W/D; BPAD, per Hx  COGNITIVE FEATURES THAT CONTRIBUTE TO RISK: limited insight; impulsivity.  SUICIDE RISK: Pt viewed as a chronic moderate increased risk of harm to self in light of her past hx and risk factors.  No acute safety concerns on the unit.  Pt contracting for safety and in need of crisis stabilization & Tx.  PLAN OF CARE: Pt admitted for crisis stabilization, detox and treatment.  Please see orders, increased dose of librium and robaxin with restart of Seroquel.  Medications reviewed with pt and medication education provided.  Will continue q15 minute checks per unit protocol.  No clinical indication for one on one level of observation at this time.  Pt contracting for safety.  Mental health treatment, medication management and continued sobriety will mitigate against the increased risk of harm to self and/or others.  Discussed the importance of recovery with pt, as  well as, tools to move forward in a healthy & safe manner.  Pt agreeable with the plan.  Discussed with the team.   Lupe Carney 08/29/2011, 3:08 PM

## 2011-08-29 NOTE — Progress Notes (Signed)
Patient admitted from [facility] for heroin and alcohol dependence.  Has used alcohol daily for 18-20 years and heroin off and on for the past 3 years.  Has used heroin and drank alcohol daily for the last year.  States that she is "tired of being tired" and sought treatment.  Patient has 2 children, a daughter 66 who lives with biological father, and a son,7, who lives with patient's mother.  Patient is currently homeless. Denies SI/HI at this time.

## 2011-08-29 NOTE — Progress Notes (Signed)
[facility] Group Notes:  (Counselor/Nursing/MHT/Case Management/Adjunct)  08/29/2011 11:36 PM  Type of Therapy:  Group Therapy at 1:15  Participation Level:  Did Not Attend; Joss was admitted to unit shortly before group  Clide Dales 08/29/2011, 11:36 PM
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
## 2011-08-29 NOTE — BH Assessment (Signed)
Assessment Note   Shannon Dawson is an 36 y.o. female referred to [place] by [facility] seeking detox from alcohol and drugs.  Patient admits to drinking 1 pint  Of whiskey daily, for 20 years, uses heroin and spends about $150 on it, and also uses Cocaine and opiates. Expressing remorse related to friend who died secondary to overdosing on Heroine that patient had given him.  Has Hx of Hep C.  Pt reports that  Her alcohol use is affecting her job performance.  Pt is a smoker of cigarette, smokes about 2 packs a day.  Pt is accepted to the Adult Unit per Garald Braver order.    Axis I: Rule out Substance dependence Axis II: Deferred Axis III: Hep C Axis [id number] environment, occupational problems. Axis V: 31-40 impairment in reality testing  Past Medical History: No past medical history on file.  No past surgical history on file.  Family History: No family history on file.  Social History:  does not have a smoking history on file. She does not have any smokeless tobacco history on file. Her alcohol and drug histories not on file.  Additional Social History:     CIWA:   COWS:    Allergies: Allergies not on file  Home Medications:  (Not in a hospital admission)  OB/GYN Status:  No LMP recorded.  General Assessment Data Location of Assessment: [facility] Living Arrangements: Children Can pt return to current living arrangement?: Yes Admission Status: Voluntary Is patient capable of signing voluntary admission?: Yes Transfer from: Other (Comment) (emergency services) Referral Source: MD ([facility])  Education Status Is patient currently in school?: No Highest grade of school patient has completed: unknown Name of school: na Contact person: Doristine Mango ([phone])  Risk to self Suicidal Ideation: No Suicidal Intent: No Is patient at risk for suicide?: Yes Suicidal Plan?: No Access to Means: Yes Specify Access to Suicidal Means:  medications What has been your use of drugs/alcohol within the last 12 months?: active Previous Attempts/Gestures: No How many times?: 0  Other Self Harm Risks: na Triggers for Past Attempts: None known Intentional Self Injurious Behavior: None Family Suicide History: No Recent stressful life event(s): Other (Comment) (eccessive grug and alcohol use) Persecutory voices/beliefs?: No Depression: Yes Depression Symptoms: Guilt Substance abuse history and/or treatment for substance abuse?: Yes Suicide prevention information given to non-admitted patients: Not applicable  Risk to Others Homicidal Ideation: No Thoughts of Harm to Others: No Current Homicidal Intent: No Current Homicidal Plan: No Access to Homicidal Means: No Identified Victim: na History of harm to others?: No Assessment of Violence: None Noted Violent Behavior Description: na Does patient have access to weapons?: No Criminal Charges Pending?: No Does patient have a court date: No  Psychosis Hallucinations: None noted Delusions: None noted  Mental Status Report Appear/Hygiene: Improved Eye Contact: Fair Motor Activity: Freedom of movement Speech: Soft Level of Consciousness: Alert Mood: Depressed Affect: Depressed Anxiety Level: Moderate Thought Processes: Coherent Judgement: Impaired Orientation: Person;Place;Time;Situation Obsessive Compulsive Thoughts/Behaviors: None  Cognitive Functioning Concentration: Decreased Memory: Recent Intact;Remote Intact IQ: Average Insight: Fair Impulse Control: Poor Appetite: Poor Weight Loss: 0  Weight Gain: 0  Sleep: Decreased Total Hours of Sleep: 3  Vegetative Symptoms: Staying in bed  ADLScreening [facility]) Patient's cognitive ability adequate to safely complete daily activities?: Yes Patient able to express need for assistance with ADLs?: Yes Independently performs ADLs?: Yes  Abuse/Neglect [facility]) Physical Abuse: Denies Verbal Abuse:  Denies  Prior Inpatient  Therapy Prior Inpatient Therapy: Yes Prior Therapy Dates: unknown Prior Therapy Facilty/Provider(s): [facility] Reason for Treatment: substance abuse  Prior Outpatient Therapy Prior Outpatient Therapy: No Prior Therapy Dates: na Prior Therapy Facilty/Provider(s): na Reason for Treatment: na  ADL Screening (condition at time of admission) Patient's cognitive ability adequate to safely complete daily activities?: Yes Patient able to express need for assistance with ADLs?: Yes Independently performs ADLs?: Yes       Abuse/Neglect Assessment (Assessment to be complete while patient is alone) Physical Abuse: Denies Verbal Abuse: Denies          Additional Information 1:1 In Past 12 Months?: No CIRT Risk: No Elopement Risk: No Does patient have medical clearance?: Yes     Disposition:  Disposition Disposition of Patient: Inpatient treatment program Type of inpatient treatment program: Adult  On Site Evaluation by:   Reviewed with Physician:     Olin Pia 08/29/2011 3:44 AM

## 2011-08-29 NOTE — Progress Notes (Signed)
Pt. Has flat affect.  Denies SI/HI and denies A/V hallucinations.  Pt. Did not attend Karaoke and remained in bed.  Reviewed pt.'s medications with her.  Support given.

## 2011-08-29 NOTE — Discharge Planning (Signed)
Met new patient this afternoon.  States she is here for detox from heroin,and "I need to get straight to be a parent for my kids."  They are ages 1 and 12 and staying with family members.  Wants long term treatment. Homeless. Told her about [facility] and [facility].  She asked for application for both.  Given.  [facility] as backup if she cannot get into them directly from here.

## 2011-08-30 MED ORDER — TRAZODONE HCL 50 MG PO TABS
50.0000 mg | ORAL_TABLET | Freq: Once | ORAL | Status: AC
  Administered 2011-08-30: 50 mg via ORAL
  Filled 2011-08-30: qty 1

## 2011-08-30 MED ORDER — HYDROCORTISONE 1 % EX CREA
TOPICAL_CREAM | Freq: Three times a day (TID) | CUTANEOUS | Status: DC
  Filled 2011-08-30: qty 28

## 2011-08-30 MED ORDER — ROPINIROLE HCL 0.25 MG PO TABS
0.2500 mg | ORAL_TABLET | ORAL | Status: AC
  Administered 2011-08-30: 0.25 mg via ORAL
  Filled 2011-08-30 (×2): qty 1

## 2011-08-30 MED ORDER — LORAZEPAM 1 MG PO TABS
ORAL_TABLET | ORAL | Status: AC
  Filled 2011-08-30: qty 2

## 2011-08-30 MED ORDER — LORAZEPAM 1 MG PO TABS
2.0000 mg | ORAL_TABLET | Freq: Four times a day (QID) | ORAL | Status: DC | PRN
  Administered 2011-08-30: 13:00:00 via ORAL
  Administered 2011-08-30 – 2011-09-05 (×17): 2 mg via ORAL
  Filled 2011-08-30 (×4): qty 2
  Filled 2011-08-30: qty 1
  Filled 2011-08-30 (×5): qty 2
  Filled 2011-08-30: qty 1
  Filled 2011-08-30 (×7): qty 2

## 2011-08-30 MED ORDER — PHENYLEPH-SHARK LIV OIL-MO-PET 0.25-3-14-71.9 % RE OINT
TOPICAL_OINTMENT | RECTAL | Status: DC
  Administered 2011-08-30 – 2011-09-05 (×6): via RECTAL
  Filled 2011-08-30 (×2): qty 28.4

## 2011-08-30 MED ORDER — FLUCONAZOLE 100 MG PO TABS
150.0000 mg | ORAL_TABLET | Freq: Once | ORAL | Status: AC
  Administered 2011-08-30: 150 mg via ORAL
  Filled 2011-08-30: qty 2
  Filled 2011-08-30: qty 1

## 2011-08-30 MED ORDER — QUETIAPINE FUMARATE 50 MG PO TABS
50.0000 mg | ORAL_TABLET | Freq: Two times a day (BID) | ORAL | Status: DC
  Administered 2011-08-30 – 2011-08-31 (×3): 50 mg via ORAL
  Filled 2011-08-30 (×7): qty 1

## 2011-08-30 MED ORDER — NICOTINE 21 MG/24HR TD PT24
MEDICATED_PATCH | TRANSDERMAL | Status: AC
  Administered 2011-08-30: 09:00:00
  Filled 2011-08-30: qty 1

## 2011-08-30 NOTE — Progress Notes (Signed)
Pt. Is very agitated about her medication demanding a printout of everything she is taking; Pt. Has been to the med window numerous times asking for whatever med she can have. Contracts for safety. No SI or HI. Pt stated she now has a yeast infection and wants meds for this. PA aware. Pt. Is very intrusive at times and does not permit one question to be answerd before she starts another question. Given 2mg  of ativan for her anxiety as she stated it was really bad.

## 2011-08-30 NOTE — Discharge Planning (Signed)
Met with patient in Aftercare Planning Group.   She had flat affect and stated she continues to have withdrawal symptoms, saying "Are you kidding me?" when Case Manager if she is experiencing a bad withdrawal.  She feels like her skin is on pins and needles, and stated she cannot think straight.    She hopes to go first to [facility] in [place], then from there go to [place].  She has received the applications to both from her regular Case Manager, but has not started completing the applications yet.  She said she cannot do so at this time because she feels so bad and is unable to think properly to do an application.  Patient is not missing any appointments, court dates, etc., at this time and has no need of case management services today.  Ambrose Mantle, LCSW 08/30/2011, 9:43 AM

## 2011-08-30 NOTE — Progress Notes (Signed)
Psychoeducational Group Note  Date:  08/30/2011 Time:1015  Group Topic/Focus:  Relapse Prevention Planning:   The focus of this group is to define relapse and discuss the need for planning to combat relapse.  Participation Level:  Minimal  Participation Quality:  Drowsy and Inattentive  Affect:  Angry and Blunted  Cognitive:  Oriented  Insight:  Limited  Engagement in Group:  None  Additional Comments: Pt came into group late and participation was minimal.  Dalia Heading 08/30/2011, 1:46 PM

## 2011-08-30 NOTE — Progress Notes (Signed)
[facility] Group Notes:  (Counselor/Nursing/MHT/Case Management/Adjunct)  08/30/2011 7:20 PM  Type of Therapy:  Group Therapy at 11 and 1:15  Participation Level:  Did Not Attend either group; attempt to encourage patient to attend group shortly before 1:15 meet with contempt.   Clide Dales 08/30/2011, 7:20 PM
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
## 2011-08-30 NOTE — Progress Notes (Signed)
D sleeps some w/medications, appetite is improving, energy level is low and ability to pay attention is poor, depressed 5/10 and hopeless 5/10, denies Si or Hi, going to DR for meals and taking meds as o rdered by MD. A q30min sfaety checks continue and support offered Rsafety maintained

## 2011-08-30 NOTE — Progress Notes (Signed)
Sleeping at long intervals.  Appears easily but only slightly disturbed --- no awakening--- during up close observation for Q 15 minute safety checks.  Irritable and demanding when awake.  Earlier she was strongly soliciting either, "valium or Ativan for restless legs."  Reluctantly accepted the "now" doses of prescribed oral Requip and Trazadone  following some protests, profanity, and threats to leave hospital.  Her response is somewhat positive when  empathy, emotional support  and positive regard are expressed.

## 2011-08-30 NOTE — Tx Team (Signed)
Interdisciplinary Treatment Plan Update (Adult)  Date:  08/30/2011  Time Reviewed:  10:16 AM   Progress in Treatment: Attending groups: Yes Participating in groups:  Yes Taking medication as prescribed: Yes Tolerating medication:  Yes Family/Significant othe contact made:  Counselor attempting to make contact Patient understands diagnosis:  Yes Discussing patient identified problems/goals with staff:  Yes Medical problems stabilized or resolved:  Yes Denies suicidal/homicidal ideation: Yes Issues/concerns per patient self-inventory:  None identified Other: N/A  New problem(s) identified: None Identified  Reason for Continuation of Hospitalization: Anxiety Depression Medication stabilization Withdrawal symptoms  Interventions implemented related to continuation of hospitalization: mood stabilization, medication monitoring and adjustment, group therapy and psycho education, safety checks q 15 mins  Additional comments: N/A  Estimated length of stay: 3-5 days  Discharge Plan: SW will assess for appropriate referrals  New goal(s): N/A  Review of initial/current patient goals per problem list:    1.  Goal(s): Address substance use  Met:  No  Target date: by discharge  As evidenced by: completing detox protocol and refer to appropriate treatment  2.  Goal (s): Reduce depressive and anxiety symptoms  Met:  No  Target date: by discharge  As evidenced by: Reducing depression from a 10 to a 3 as reported by pt.    3.  Goal(s): Eliminate SI  Met:  No  Target date: by discharge  As evidenced by: pt denying SI   Attendees: Patient:  Shannon Dawson 08/30/2011 10:19 AM   Family:     Physician:  Lupe Carney, DO 08/30/2011 10:16 AM   Nursing: Alease Frame, RN 08/30/2011 10:16 AM   Case Manager:  Reyes Ivan, LCSWA 08/30/2011  10:16 AM   Counselor:  Ronda Fairly, LCSWA 08/30/2011  10:16 AM   Other:  Chrisandra Netters, RN 08/30/2011 10:16 AM   Other:     Other:      Other:      Scribe for Treatment Team:   Reyes Ivan 08/30/2011 10:16 AM

## 2011-08-30 NOTE — Progress Notes (Signed)
[facility] MD Progress Note  08/30/2011 11:32 AM  S/O: Patient seen and evaluated. Chart reviewed. Patient stated that her mood was "not good". Her affect was mood congruent, anxious and irritable. Sig w/d noted. She denied any current thoughts of self injurious behavior, suicidal ideation or homicidal ideation. There were no auditory or visual hallucinations, paranoia, delusional thought processes, or mania noted. Thought process was linear and goal directed. Sig psychomotor agitation was noted. Speech was normal rate, tone and volume. Eye contact was good. Judgment and insight are fair. Patient has been up and engaged on the unit. No acute safety concerns reported from team.   Sleep:  Number of Hours: 4.5    Vital Signs:Blood pressure 105/76, pulse 109, temperature 98.2 F (36.8 C), temperature source Oral, resp. rate 16, height 5\' 2"  (1.575 m), weight 68.04 kg (150 lb), last menstrual period 08/18/2011.  Lab Results:  Results for orders placed during the hospital encounter of 08/29/11 (from the past 48 hour(s))  COMPREHENSIVE METABOLIC PANEL     Status: Abnormal   Collection Time   08/29/11  7:44 PM      Component Value Range Comment   Sodium 138  135 - 145 mEq/L    Potassium 4.0  3.5 - 5.1 mEq/L    Chloride 100  96 - 112 mEq/L    CO2 27  19 - 32 mEq/L    Glucose, Bld 89  70 - 99 mg/dL    BUN 17  6 - 23 mg/dL    Creatinine, Ser 1.61  0.50 - 1.10 mg/dL    Calcium 09.6  8.4 - 10.5 mg/dL    Total Protein 7.8  6.0 - 8.3 g/dL    Albumin 3.5  3.5 - 5.2 g/dL    AST 11  0 - 37 U/L    ALT 6  0 - 35 U/L    Alkaline Phosphatase 100  39 - 117 U/L    Total Bilirubin 0.2 (*) 0.3 - 1.2 mg/dL    GFR calc non Af Amer 77 (*) >90 mL/min    GFR calc Af Amer 89 (*) >90 mL/min   CBC     Status: Abnormal   Collection Time   08/29/11  7:44 PM      Component Value Range Comment   WBC 7.8  4.0 - 10.5 K/uL    RBC 4.67  3.87 - 5.11 MIL/uL    Hemoglobin 13.6  12.0 - 15.0 g/dL    HCT 04.5  40.9 - 81.1 %    MCV  88.2  78.0 - 100.0 fL    MCH 29.1  26.0 - 34.0 pg    MCHC 33.0  30.0 - 36.0 g/dL    RDW 91.4 (*) 78.2 - 15.5 %    Platelets 288  150 - 400 K/uL   DIFFERENTIAL     Status: Normal   Collection Time   08/29/11  7:44 PM      Component Value Range Comment   Neutrophils Relative 50  43 - 77 %    Neutro Abs 3.8  1.7 - 7.7 K/uL CORRECTED ON 06/13 AT 2043: PREVIOUSLY REPORTED AS 3.9   Lymphocytes Relative 42  12 - 46 %    Lymphs Abs 3.3  0.7 - 4.0 K/uL CORRECTED ON 06/13 AT 2043: PREVIOUSLY REPORTED AS 3.2   Monocytes Relative 6  3 - 12 %    Monocytes Absolute 0.5  0.1 - 1.0 K/uL    Eosinophils Relative 2  0 -  5 %    Eosinophils Absolute 0.2  0.0 - 0.7 K/uL CORRECTED ON 06/13 AT 2043: PREVIOUSLY REPORTED AS 0.1   Basophils Relative 0  0 - 1 %    Basophils Absolute 0.0  0.0 - 0.1 K/uL    Smear Review MORPHOLOGY UNREMARKABLE     ETHANOL     Status: Normal   Collection Time   08/29/11  7:44 PM      Component Value Range Comment   Alcohol, Ethyl (B) <11  0 - 11 mg/dL   HIV ANTIBODY (ROUTINE TESTING)     Status: Normal   Collection Time   08/29/11  7:45 PM      Component Value Range Comment   HIV NON REACTIVE  NON REACTIVE   RPR     Status: Normal   Collection Time   08/29/11  7:45 PM      Component Value Range Comment   RPR NON REACTIVE  NON REACTIVE     Physical Findings: CIWA:  CIWA-Ar Total: 6  COWS:  COWS Total Score: 5   CLINICAL FACTORS: Alcohol & Opioid Dependence with W/D; BPAD, per Hx  Plan:  Pt seen and evaluated. Reviewed short term and long term goals, medications, current treatment in the hospital and acute/chronic safety.  Pt denied any current thoughts of self harm, suicidal ideation or homicidal ideation.  Contracted for safety on the unit.  No acute issues noted.  VS reviewed with team.  Pt agreeable with treatment plan, see orders.   Lupe Carney 08/30/2011, 11:32 AM

## 2011-08-31 DIAGNOSIS — F1120 Opioid dependence, uncomplicated: Secondary | ICD-10-CM

## 2011-08-31 DIAGNOSIS — F1020 Alcohol dependence, uncomplicated: Secondary | ICD-10-CM

## 2011-08-31 MED ORDER — QUETIAPINE FUMARATE 100 MG PO TABS
100.0000 mg | ORAL_TABLET | Freq: Two times a day (BID) | ORAL | Status: DC
  Administered 2011-09-01 – 2011-09-03 (×5): 100 mg via ORAL
  Filled 2011-08-31 (×9): qty 1

## 2011-08-31 NOTE — Progress Notes (Signed)
Patient ID: Shannon Dawson, female   DOB: 01-Nov-1975, 36 y.o.   MRN: [id number]   [facility] Group Notes:  (Counselor/Nursing/MHT/Case Management/Adjunct)  08/31/2011 1:15 PM  Type of Therapy:  Group Therapy, Dance/Movement Therapy   Participation Level:  Did Not Attend      Cassidi Long

## 2011-08-31 NOTE — Progress Notes (Signed)
D-Pt demanding and medication seeking this shift. Is keeping records of prn and medication administration. C/Ochills,cravings, and agitation.PRN medications requested and received with ongoing education and explanation given.  A-agitation, med-seeking behavior. Attending groups. R- Continue to assess prn medication outcomes.  Cont to support and encourage. 15' checks cont for safety.

## 2011-08-31 NOTE — Progress Notes (Signed)
Patient ID: Shannon Dawson, female   DOB: 1975-07-27, 35 y.o.   MRN: [id number] Pt resting in bed with eyes closed.  Fifteen minute checks in progress. Pt safe on unit.

## 2011-08-31 NOTE — Progress Notes (Signed)
[facility] In[facility] Family/Significant Other Suicide Prevention Education  Suicide Prevention Education:  Patient Refusal for Family/Significant Other Suicide Prevention Education: The patient Shannon Dawson has refused to provide written consent for family/significant other to be provided Family/Significant Other Suicide Prevention Education during admission and/or prior to discharge.  Physician notified.  Pt. accepted information on suicide prevention, warning signs to look for with suicide and crisis line numbers to use. The pt. agreed to call crisis line numbers if having warning signs or having thoughts of suicide.  Pt stated that she has "noone" to call or to reach out to. Pt did agree to notify staff if this changes.  [facility] 08/31/2011, 10:14 AM

## 2011-08-31 NOTE — Progress Notes (Signed)
Patient ID: Shannon Dawson, female   DOB: 1975/09/22, 36 y.o.   MRN: [id number] Pt. attended and participated in aftercare planning group. Pt. verbally information on suicide prevention, warning signs to look for with suicide and crisis line numbers to use. The pt. agreed to call crisis line numbers if having warning signs or having thoughts of suicide. Pt. listed their current anxiety level as 4 and depression level as a 0 on a scale of 1 to 10 with 10 being the high. Pt. accepted and AA and NA meeting schedule

## 2011-08-31 NOTE — Progress Notes (Signed)
Patient ID: Shannon Dawson, female   DOB: 1975-12-17, 36 y.o.   MRN: [id number]  Think he mood is unstable during day times. Wants her Seroquel in AM to be increased. Good sleep. Detox going well.   Mental Status Examination/Evaluation:  Objective: Appearance: Disheveled   Eye Contact:: Good   Speech: Clear and Coherent   Volume: Normal   Mood: Depressed   Affect: Congruent   Thought Process: Coherent, Intact and Linear   Orientation: Full   Thought Content: WDL   Suicidal Thoughts: No   Homicidal Thoughts: No   Memory: Recent; Fair   Judgement: poor  Insight: poor  Psychomotor Activity: Normal   Concentration: Fair   Recall: Fair   Akathisia: No   Handed:   AIMS (if indicated):   Assets: Communication Skills  Desire for Improvement   Sleep:    Laboratory/X-Ray  Psychological Evaluation(s)      Assessment:  AXIS I: Opiate dependence, alcohol dependence  AXIS II: Deferred  AXIS III:  Past Medical History   Diagnosis  Date   .  Anxiety    .  Mental disorder    .  Depression    .  Headache    .  Family history of anesthesia complication     AXIS IV: other psychosocial or environmental problems, problems with access to health care services and problems with primary support group  AXIS V: 45  Treatment Plan/Recommendations:   Will increase AM seroquel to 100 mg qam

## 2011-08-31 NOTE — Progress Notes (Signed)
Pt is a 36 year old Caucasion female admitted for ETOH abuse and polysubstance abuse.  Pt has had withdrawal symptoms most acutely anxiety this evening.  Pt did attend wrap up group, and is interacting well with peers.  Denies SI/HI/hallucinations.   A.  Support and encouragement offered, medications for withdrawal given as ordered.  R.  Pt sat up until dayroom closed then went to room to await next time she could get ativan.  Once received, she went to bed and is resting in her room in no apparent distress at this time.  Will continue to monitor.

## 2011-09-01 NOTE — Progress Notes (Signed)
D-Per patient inventory sheet: reports fair sleep,improving appetite,low energy. C/O "voices" this morning and this writer described difference between voices and thoughts.  A- Up in milieu. Working on d/c f/u paperwork. Continues to focus on medications. Rates depression at 0 and hopelessness at 5.  Reports tremors,diarrhea,chilling,and agitation on patient inventory but verbalized constipation. R- Informed MD of "voices".  Compliant with scheduled medications. Support and encouragment offered. 15' checks cont for safety.

## 2011-09-01 NOTE — Progress Notes (Signed)
Patient ID: Shannon Dawson, female   DOB: 1975/08/25, 36 y.o.   MRN: [id number]  Pt. attended and participated in aftercare planning group. Pt. reported that her anxiety level was 7 and her depression level was above 10. Pt. verbally accepted suicide prevention information. Pt. stated that she had too much going on to talk about in 30 minutes. Pt. appeared to be aggitated in group.

## 2011-09-01 NOTE — Progress Notes (Signed)
Patient ID: Shannon Dawson, female   DOB: February 26, 1976, 36 y.o.   MRN: [id number]  [facility] Group Notes:  (Counselor/Nursing/MHT/Case Management/Adjunct)  09/01/2011 1:15 PM  Type of Therapy:  Group Therapy, Dance/Movement Therapy   Participation Level:  Minimal  Participation Quality:  Appropriate, Sharing and Supportive  Affect:  Appropriate  Cognitive:  Oriented  Insight:  Limited  Engagement in Group:  Limited  Engagement in Therapy:  Limited  Modes of Intervention:  Clarification, Problem-solving, Role-play, Socialization and Support  Summary of Progress/Problems: Pt participated in a group discussion and viewing of the movie "My name is Francisco Capuchin". Pt spoke about themes of enabling, family dynamics of the addictive family, support systems/12 step groups and the power of addiction. Pt was encouraged to develop a plan on how to use 12 step programs/supports upon D/C.    Rhunette Croft

## 2011-09-01 NOTE — Progress Notes (Signed)
Patient ID: Shannon Dawson, female   DOB: 11-09-1975, 36 y.o.   MRN: [id number]  Upset about more AH now. Think her mood is unstable during day times. Wants her Seroquel to be adjusted gradually. Good sleep. Detox going well.   Mental Status Examination/Evaluation:  Objective: Appearance: Disheveled   Eye Contact:: Good   Speech: Clear and Coherent   Volume: Normal   Mood: Depressed   Affect: Congruent   Thought Process: Coherent, Intact and Linear   Orientation: Full   Thought Content: WDL   Suicidal Thoughts: No   Homicidal Thoughts: No   Memory: Recent; Fair   Judgement: poor  Insight: poor  Psychomotor Activity: Normal   Concentration: Fair   Recall: Fair   Akathisia: No   Handed:   AIMS (if indicated):   Assets: Communication Skills  Desire for Improvement   Sleep:    Laboratory/X-Ray  Psychological Evaluation(s)      Assessment:  AXIS I: Opiate dependence, alcohol dependence  AXIS II: Deferred  AXIS III:  Past Medical History   Diagnosis  Date   .  Anxiety    .  Mental disorder    .  Depression    .  Headache    .  Family history of anesthesia complication     AXIS IV: other psychosocial or environmental problems, problems with access to health care services and problems with primary support group  AXIS V: 45  Treatment Plan/Recommendations:   Continue current meds. Primary team to consider Increasing seroquel later as needed

## 2011-09-01 NOTE — Progress Notes (Signed)
Pt is pleasant on approach.  Very concerned that she won't be able to smoke when she leaves for long term treatment and wants reassurance of this before going.  Pt denies SI/HI/hallucinations at this time.  Positive for AA group this evening.  Interacting appropriately within milieu.  Support and encouragement offered, will continue to monitor.

## 2011-09-02 MED ORDER — NICOTINE 21 MG/24HR TD PT24
21.0000 mg | MEDICATED_PATCH | Freq: Every day | TRANSDERMAL | Status: DC
  Administered 2011-09-02 – 2011-09-05 (×4): 21 mg via TRANSDERMAL
  Filled 2011-09-02 (×7): qty 1

## 2011-09-02 MED ORDER — VALPROIC ACID 250 MG PO CAPS
250.0000 mg | ORAL_CAPSULE | Freq: Two times a day (BID) | ORAL | Status: DC
  Filled 2011-09-02 (×2): qty 1

## 2011-09-02 MED ORDER — DIVALPROEX SODIUM 250 MG PO DR TAB
250.0000 mg | DELAYED_RELEASE_TABLET | Freq: Two times a day (BID) | ORAL | Status: DC
  Administered 2011-09-02 – 2011-09-03 (×2): 250 mg via ORAL
  Filled 2011-09-02 (×8): qty 1

## 2011-09-02 NOTE — Progress Notes (Signed)
Date: 09/02/2011        Time: 1145       Group Topic/Focus: Patient invited to participate in animal assisted therapy. Pets as a coping skill and responsibility were discussed.   Participation Level: Active  Participation Quality: Appropriate and Attentive  Affect: Irritable  Cognitive: Appropriate and Oriented   Additional Comments: Patient somehow managed to make it to the 500 hall dayroom for pet therapy, became irritable with RT when she explained the session was for 500 hall patients.

## 2011-09-02 NOTE — Discharge Planning (Signed)
Said she was doing well in AM group.  Others gently confronted, saying she is regularly agitated and short with peers.  She admitted to this, and also complained of current meds as they are making her hear and see things that are not there.  Assured her she would see Dr today to talk about further med adjustments.  Encouraged her to get applications done for rehab so I can send those in.  Behavior appropriate in group.  No evidence of psychosis, agitation minimal.

## 2011-09-02 NOTE — Progress Notes (Signed)
[facility] MD Progress Note  09/02/2011 3:29 PM  S/O: Patient seen and evaluated in team. Chart reviewed. Patient stated that her mood was "not good". Staff indicate "med seeking behavior over the wkend". Her affect was mood congruent, anxious and irritable. Now open to restarting mood stabilizer.  She denied any current thoughts of self injurious behavior, suicidal ideation or homicidal ideation. There were no paranoia, delusional thought processes, or mania noted. Report of AVH, possible malingering noted. Description of symptoms not congruent with typical process of psychotic s/s.  Thought process was linear and goal directed.  Pt not responding to internal stimuli. Sig psychomotor agitation was noted. Speech was normal rate, tone and volume. Eye contact was good. Judgment and insight are fair. Patient has been up and engaged on the unit. No acute safety concerns reported from team.   Sleep:  Number of Hours: 5    Vital Signs:Blood pressure 112/77, pulse 122, temperature 97.1 F (36.2 C), temperature source Oral, resp. rate 20, height 5\' 2"  (1.575 m), weight 68.04 kg (150 lb), last menstrual period 08/18/2011, SpO2 100.00%.  Lab Results:  No results found for this or any previous visit (from the past 48 hour(s)).  Physical Findings: CIWA:  CIWA-Ar Total: 6  COWS:  COWS Total Score: 5   CLINICAL FACTORS: Alcohol & Opioid Dependence with W/D; BPAD, per Hx  Plan:  Pt seen and evaluated. Reviewed short term and long term goals, medications, current treatment in the hospital and acute/chronic safety.  Pt denied any current thoughts of self harm, suicidal ideation or homicidal ideation.  Contracted for safety on the unit.  No acute issues noted.  VS reviewed with team.  Agreed to start valproic acid, stated it was less sedating for her than lithium.  Medication education completed.  Pros, cons, risks, potential side effects and benefits were discussed with pt.  Pt agreeable with the plan.  See orders.   Discussed with team.   Lupe Carney 09/02/2011, 3:29 PM

## 2011-09-02 NOTE — Progress Notes (Signed)
Shannon Dawson continues to be focused on medications stating that if she doesn't have something she will "flip out." Intrusive and dramatic with symptoms. C/O heartburn and constipation. A- Labile and attention seeking. Constant redirection needed concerning medications. Rates depression and hopelessness at 4.  Also reporting fair sleep and good appetite is observed. R-Reasses GI prn medication effectiveness. Cont support and encouragement.. Continue current POC and 15' checks for safety.

## 2011-09-02 NOTE — Progress Notes (Addendum)
[facility] Group Notes:  (Counselor/Nursing/MHT/Case Management/Adjunct)  09/02/2011 4:25 PM  Type of Therapy:  Group Therapy  Participation Level:  Minimal  Participation Quality:  Intrusive  Affect:  Blunted and Irritable  Cognitive:  Oriented  Insight:  Limited  Engagement in Group:  Limited  Engagement in Therapy:  Limited  Modes of Intervention:  Clarification, Limit-setting and Socialization  Summary of Progress/Problems:  Group discussion focused on what patient's see as their own obstacles to recovery.  Patient shares belief that sobriety will be difficult to deal with due to cravings and feels she needs to be in long term treatment to avoid  Long standing patterns of use. Others in group suggested she might be able to remain in location with supports such as NA/AA, [facility], therapy and IOP.  Patient shared "You are all clueless, but sure yea, ha ha."  Patient was not open tor further exploration or support offered.   Clide Dales 09/02/2011, 4:31 PM  [facility] Group Notes:  (Counselor/Nursing/MHT/Case Management/Adjunct)  09/02/2011 4:37 PM  Type of Therapy:  Group Therapy  Participation Level:  Minimal  Participation Quality:  Attentive  Affect:  Irritable  Cognitive:  Alert and Oriented  Insight:  None SHARED  Engagement in Group:  Limited  Modes of Intervention:  Education and Support  Summary of Progress/Problems: Patient attended group presentation about services and resources offered at  [facility] ([facility]). Manaia was minimally attentive  Clide Dales 09/02/2011, 4:37 PM

## 2011-09-02 NOTE — Treatment Plan (Signed)
Interdisciplinary Treatment Plan Update (Adult)  Date: 09/02/2011  Time Reviewed: 5:18 PM   Progress in Treatment: Attending groups: Yes Participating in groups: Yes Taking medication as prescribed: Yes Tolerating medication: Yes   Family/Significant other contact made:  Yes Patient understands diagnosis:  Yes Discussing patient identified problems/goals with staff:  Yes Medical problems stabilized or resolved:  Yes Denies suicidal/homicidal ideation: Yes In tx team Issues/concerns per patient self-inventory:  Depression and hopelessness are 4's, C/O significant withdrawal symptoms and headache.  Rates pain a 7 Other:  New problem(s) identified: N/A  Reason for Continuation of Hospitalization: Depression Hallucinations Medication stabilization Withdrawal symptoms  Interventions implemented related to continuation of hospitalization:  Ativan PRN, Depakote trial, titrate seroquel down  Encourage group attendance and participation.    Additional comments:Paquita has filled out application for [facility],  Has application for [facility], will refer to [facility] as [facility] on Wed  Estimated length of stay: 3 days  Discharge Plan:  New goal(s): N/A  Review of initial/current patient goals per problem list:   1.  Goal(s): Eliminate SI  Met:  Yes  Target date:6/17  As evidenced ZO:[id number] report in tx team  2.  Goal (s):address substance abuse/get into rehab from here  Met:  No  Target date:6/20  As evidenced [id number] of bed  3.  Goal(s):Address mood, thought challenges  Met:  No  Target date:6/20  As evidenced GN:[id number] of psychosis and a depression and anxiety rating of 3 or less  4.  Goal(s):  Met:  No  Target date:  As evidenced by:  Attendees: Patient:  Shannon Dawson 09/02/2011 5:18 PM  Family:     Physician:  Lupe Carney 09/02/2011 5:18 PM   Nursing: Aquia Harbour Cellar   09/02/2011 5:18 PM   Case Manager:  Richelle Ito, LCSW 09/02/2011 5:18 PM   Counselor:   Ronda Fairly, LCSWA 09/02/2011 5:18 PM   Other:     Other:     Other:     Other:      Scribe for Treatment Team:   Ida Rogue, 09/02/2011 5:18 PM

## 2011-09-03 MED ORDER — VALPROIC ACID 250 MG PO CAPS
250.0000 mg | ORAL_CAPSULE | Freq: Two times a day (BID) | ORAL | Status: DC
  Administered 2011-09-03 – 2011-09-06 (×6): 250 mg via ORAL
  Filled 2011-09-03: qty 1
  Filled 2011-09-03: qty 28
  Filled 2011-09-03: qty 1
  Filled 2011-09-03: qty 28
  Filled 2011-09-03 (×2): qty 1
  Filled 2011-09-03: qty 28
  Filled 2011-09-03 (×3): qty 1
  Filled 2011-09-03: qty 28

## 2011-09-03 MED ORDER — QUETIAPINE FUMARATE 200 MG PO TABS
200.0000 mg | ORAL_TABLET | Freq: Three times a day (TID) | ORAL | Status: DC
  Administered 2011-09-03 – 2011-09-06 (×8): 200 mg via ORAL
  Filled 2011-09-03: qty 1
  Filled 2011-09-03: qty 42
  Filled 2011-09-03 (×5): qty 1
  Filled 2011-09-03: qty 42
  Filled 2011-09-03: qty 1
  Filled 2011-09-03: qty 42
  Filled 2011-09-03: qty 1

## 2011-09-03 MED ORDER — TUBERCULIN PPD 5 UNIT/0.1ML ID SOLN
5.0000 [IU] | Freq: Once | INTRADERMAL | Status: AC
  Administered 2011-09-03: 5 [IU] via INTRADERMAL

## 2011-09-03 NOTE — Progress Notes (Signed)
D: Pt states she slept poor, appetite is poor, energy level is low. Pt states she is experiencing cravings, agitation, chilling, denies SI/HI. A: Educate pt on medication that is prescribed that can help with with detox symptoms. Support pt emotionally. R: Pt states she is going to "stay away from people that are bad, also going to a long term rehab." Pt continues to go to groups, meals, minimal insight at this time.

## 2011-09-03 NOTE — Progress Notes (Signed)
Pt is wanting a quick medication fix to control her mood stabilization. Pt is not receptive to the fact that Depakote can take around 2 weeks or so to take an effect. Pt keeps comparing Depakote to Seroquel and wonders why the effects aren't immediate. Pt explained by this Clinical research associate and another RN that it takes time for this medication to buildup in the blood stream in order for effectiveness to be evaluated. Pt encouraged to start the therapy and to give it a try. Pt returns to the topic again and fails to accept the time frame that it may possibly take for it to work.  Pt encouraged to discuss these medication matters with her physician. Pt is currently denying SI at this time. Overall pt has been anxious with agitation noted as well this evening. Continued support and availability as needed has been extended to this pt. Pt safety remains with q77min checks.

## 2011-09-03 NOTE — Progress Notes (Signed)
[facility] Group Notes:  (Counselor/Nursing/MHT/Case Management/Adjunct)  09/03/2011 12:44 PM  Type of Therapy:  Group Therapy at 11 AM  Participation Level:  Minimal  Participation Quality:  Drowsy  Affect:  Blunted  Cognitive:  Oriented  Insight:  Limited  Engagement in Group:  Limited  Engagement in Therapy:  Limited  Modes of Intervention:  Clarification and Socialization  Summary of Progress/Problems: Shannon Dawson came into group well after it began with journal, magazines, and coloring pages. Shannon Dawson feel asleep within 15 minutes during which time she shared vague complaints and feelings of regret, disappointment and anxiety. Once it was time for someone else to share Shannon Dawson lost interest and was soon drowsy.

## 2011-09-03 NOTE — Progress Notes (Signed)
[facility] MD Progress Note  09/03/2011 2:54 PM  S/O: Patient seen and evaluated. Chart reviewed. Patient stated that her mood was "little better". Her affect was mood congruent and less anxious. She denied any current thoughts of self injurious behavior, suicidal ideation or homicidal ideation. There were no paranoia, delusional thought processes, AVH or mania noted. Thought process was linear and goal directed.  Pt not responding to internal stimuli. Psychomotor agitation improved with Depakote and Seroquel. Speech was normal rate, tone and volume. Eye contact was good. Judgment and insight are fair. Patient has been up and engaged on the unit. No acute safety concerns reported from team.   Sleep:  Number of Hours: 6.5    Vital Signs:Blood pressure 111/80, pulse 102, temperature 97.4 F (36.3 C), temperature source Oral, resp. rate 16, height 5\' 2"  (1.575 m), weight 68.04 kg (150 lb), last menstrual period 08/18/2011, SpO2 100.00%.  Lab Results:  No results found for this or any previous visit (from the past 48 hour(s)).  Physical Findings: CIWA:  CIWA-Ar Total: 6  COWS:  COWS Total Score: 5   CLINICAL FACTORS: Alcohol & Opioid Dependence with W/D; BPAD, per Hx  Plan:  Increased Seroquel to 200 mg tid and changed to immediate release VA for increased mood stability.  Plan to d/c lorazepam as pt stabilizes on meds.  Interview with [facility] today.  Dispo pending.  No SEs reported.  Mood/behavior improved.  Lupe Carney 09/03/2011, 2:54 PM

## 2011-09-03 NOTE — Progress Notes (Addendum)
Pt has received several items from [facility] clothing closet within the last week and continues to ask different staff to get more items for her. This is after pt was informed that we try to only give pts that do not have any clothing or any access to clothing items from the clothing closet. Pt also had family members drop clothing off to her.

## 2011-09-03 NOTE — Progress Notes (Addendum)
Patient ID: Shannon Dawson, female   DOB: [birth year], 36 y.o.   MRN: [id number] D:Patient is pleasant but can be attention seeking at times.  Patient has constantly asked for medication while she has been awake.  Patient complains of anxiety, insomnia and pain. Patient was excited because her friend brought her clothing today and she smiled when she talked about that. Patient denies SI/HI and AVH.  A: Patient received trazadone for sleep @2056 , ativan for anxiety @2156  along with acetaminophen @2156  for right leg pain and a headache.  R: Patient offered encouragement and support.  Patient medication has been effective and patient resting without visible distress.  Staff to monitor Q15 min for safety.

## 2011-09-04 MED ORDER — DOCUSATE SODIUM 100 MG PO CAPS
100.0000 mg | ORAL_CAPSULE | Freq: Two times a day (BID) | ORAL | Status: DC
  Administered 2011-09-04 – 2011-09-06 (×5): 100 mg via ORAL
  Filled 2011-09-04 (×7): qty 1

## 2011-09-04 MED ORDER — MAGNESIUM CITRATE PO SOLN
1.0000 | Freq: Once | ORAL | Status: AC
Start: 2011-09-04 — End: 2011-09-04
  Administered 2011-09-04: 1 via ORAL

## 2011-09-04 MED ORDER — TRAZODONE HCL 100 MG PO TABS
100.0000 mg | ORAL_TABLET | Freq: Every evening | ORAL | Status: DC | PRN
  Administered 2011-09-04 – 2011-09-05 (×2): 100 mg via ORAL
  Filled 2011-09-04: qty 1
  Filled 2011-09-04: qty 14
  Filled 2011-09-04: qty 1

## 2011-09-04 MED ORDER — IBUPROFEN 800 MG PO TABS
800.0000 mg | ORAL_TABLET | Freq: Three times a day (TID) | ORAL | Status: DC | PRN
  Administered 2011-09-04 – 2011-09-05 (×2): 800 mg via ORAL
  Filled 2011-09-04 (×2): qty 1

## 2011-09-04 NOTE — Progress Notes (Signed)
D: Pt has irritable mood and sullen expression; denies any SI/HI but complains of her medications not arriving on time; pt is also slightly medication seeking and will ask RN to look at her PRN medications to see what RN can give her  A: Pt given emotional support from RN; pt given scheduled medications and was educated on her PRN meds R: Pt remains cooperative with staff but still remains in an irritable mood; will continue to monitor pt for safety
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
## 2011-09-04 NOTE — Progress Notes (Signed)
Pt. Reports "feeling better" today.  Denies SI/HI and denies A/V hallucinations.  Interacting appropriately with peers. Support given.

## 2011-09-04 NOTE — Progress Notes (Signed)
[facility] MD Progress Note  09/04/2011 10:49 AM  S/O: Patient seen and evaluated. Chart reviewed. Patient stated that her mood was "little better". Her affect was mood congruent and less anxious. She denied any current thoughts of self injurious behavior, suicidal ideation or homicidal ideation. There were no paranoia, delusional thought processes, AVH or mania noted. Thought process was linear and goal directed.  Pt not responding to internal stimuli. Psychomotor agitation improved with Depakote and Seroquel. Speech was normal rate, tone and volume. Eye contact was good. Judgment and insight are fair. Patient has been up and engaged on the unit. No acute safety concerns reported from team. Pt excited about going to [facility] on Friday.  C/o constipation, decreased sleep and continued mood lability.  Sleep:  Number of Hours: 6.25    Vital Signs:Blood pressure 114/76, pulse 112, temperature 97.9 F (36.6 C), temperature source Oral, resp. rate 20, height 5\' 2"  (1.575 m), weight 68.04 kg (150 lb), last menstrual period 08/18/2011, SpO2 100.00%.  Lab Results:  No results found for this or any previous visit (from the past 48 hour(s)).  Physical Findings: CIWA:  CIWA-Ar Total: 6  COWS:  COWS Total Score: 5   CLINICAL FACTORS: Alcohol & Opioid Dependence with W/D; BPAD, per Hx  Plan:  Increased Seroquel to 200 mg tid and changed to immediate release VA for increased mood stability yesterday.  Today will be first day on new regimen.  Started colace for c/o constipation; increased trazodone for sleep and switched tylenol to Ibuprofen for pain.  Pt agreeable with plan.  Plan to also d/c lorazepam as pt stabilizes on meds.  No SEs reported from meds.  Pt going to [facility] on Friday.  Lupe Carney 09/04/2011, 10:49 AM

## 2011-09-05 DIAGNOSIS — F319 Bipolar disorder, unspecified: Secondary | ICD-10-CM

## 2011-09-05 DIAGNOSIS — F1994 Other psychoactive substance use, unspecified with psychoactive substance-induced mood disorder: Secondary | ICD-10-CM

## 2011-09-05 DIAGNOSIS — F429 Obsessive-compulsive disorder, unspecified: Secondary | ICD-10-CM | POA: Diagnosis present

## 2011-09-05 DIAGNOSIS — F4310 Post-traumatic stress disorder, unspecified: Secondary | ICD-10-CM

## 2011-09-05 DIAGNOSIS — F1010 Alcohol abuse, uncomplicated: Secondary | ICD-10-CM

## 2011-09-05 DIAGNOSIS — F411 Generalized anxiety disorder: Secondary | ICD-10-CM | POA: Diagnosis present

## 2011-09-05 MED ORDER — IBUPROFEN 400 MG PO TABS: 400.0000 mg | ORAL_TABLET | Freq: Four times a day (QID) | ORAL | Status: DC

## 2011-09-05 MED ORDER — IBUPROFEN 400 MG PO TABS: 400.0000 mg | ORAL_TABLET | Freq: Four times a day (QID) | ORAL | Status: AC | PRN

## 2011-09-05 MED ORDER — GABAPENTIN 300 MG PO CAPS
300.0000 mg | ORAL_CAPSULE | Freq: Three times a day (TID) | ORAL | Status: DC
  Administered 2011-09-06: 300 mg via ORAL
  Filled 2011-09-05 (×5): qty 1

## 2011-09-05 MED ORDER — CHLORDIAZEPOXIDE HCL 25 MG PO CAPS
25.0000 mg | ORAL_CAPSULE | Freq: Three times a day (TID) | ORAL | Status: DC
  Administered 2011-09-05: 25 mg via ORAL
  Filled 2011-09-05: qty 1

## 2011-09-05 MED ORDER — CLONIDINE HCL 0.1 MG PO TABS: 0.1000 mg | ORAL_TABLET | Freq: Three times a day (TID) | ORAL | Status: DC

## 2011-09-05 MED ORDER — IBUPROFEN 200 MG PO TABS
400.0000 mg | ORAL_TABLET | Freq: Four times a day (QID) | ORAL | Status: DC | PRN
  Administered 2011-09-05 – 2011-09-06 (×2): 400 mg via ORAL
  Filled 2011-09-05 (×2): qty 2

## 2011-09-05 MED ORDER — QUETIAPINE FUMARATE 200 MG PO TABS: 200.0000 mg | ORAL_TABLET | Freq: Three times a day (TID) | ORAL | Status: DC

## 2011-09-05 MED ORDER — GABAPENTIN 100 MG PO CAPS: 100.0000 mg | ORAL_CAPSULE | Freq: Three times a day (TID) | ORAL | Status: DC

## 2011-09-05 MED ORDER — IBUPROFEN 200 MG PO TABS: 200.0000 mg | ORAL_TABLET | Freq: Four times a day (QID) | ORAL | Status: DC | PRN

## 2011-09-05 MED ORDER — PHENYLEPH-SHARK LIV OIL-MO-PET 0.25-3-14-71.9 % RE OINT: 1.0000 "application " | TOPICAL_OINTMENT | Freq: Two times a day (BID) | RECTAL | Status: DC | PRN

## 2011-09-05 MED ORDER — GABAPENTIN 100 MG PO CAPS
100.0000 mg | ORAL_CAPSULE | Freq: Three times a day (TID) | ORAL | Status: DC
  Administered 2011-09-05: 100 mg via ORAL
  Filled 2011-09-05 (×2): qty 1

## 2011-09-05 MED ORDER — TRAZODONE HCL 100 MG PO TABS: 100.0000 mg | ORAL_TABLET | Freq: Every evening | ORAL | Status: DC | PRN

## 2011-09-05 MED ORDER — VALPROIC ACID 250 MG PO CAPS: 250.0000 mg | ORAL_CAPSULE | Freq: Two times a day (BID) | ORAL | Status: DC

## 2011-09-05 MED ORDER — DSS 100 MG PO CAPS: 100.0000 mg | ORAL_CAPSULE | Freq: Two times a day (BID) | ORAL | Status: AC

## 2011-09-05 MED ORDER — GABAPENTIN 300 MG PO CAPS: 300.0000 mg | ORAL_CAPSULE | Freq: Three times a day (TID) | ORAL | Status: DC

## 2011-09-05 MED ORDER — CLONIDINE HCL 0.1 MG PO TABS
0.1000 mg | ORAL_TABLET | Freq: Three times a day (TID) | ORAL | Status: DC
  Administered 2011-09-05 – 2011-09-06 (×2): 0.1 mg via ORAL
  Filled 2011-09-05 (×6): qty 1

## 2011-09-05 NOTE — Progress Notes (Signed)
[facility] MD Progress Note  09/05/2011 4:20 PM  S/O: Patient seen and evaluated. Chart reviewed. Pt likes the Depakote better than the Lilthium.  She says that others tell her that she is acting nicer on the Depakote.  She asks about how high she should go up on that.  She was informed that it will have to depend on how she does on the medication.  She has a plan to go to [facility] for 3 months then [facility] for 2 years.  She requests Ibuprofen for her teeth.  Will order that for her.   Sleep:  Number of Hours: 5.75    Vital Signs:Blood pressure 114/81, pulse 125, temperature 97.8 F (36.6 C), temperature source Oral, resp. rate 16, height 5\' 2"  (1.575 m), weight 68.04 kg (150 lb), last menstrual period 08/18/2011, SpO2 100.00%.  Lab Results:  No results found for this or any previous visit (from the past 48 hour(s)).  Physical Findings: CIWA:  CIWA-Ar Total: 6  COWS:  COWS Total Score: 5   CLINICAL FACTORS: Alcohol & Opioid Dependence with W/D; BPAD, per Hx  Plan:  Increase Neurontin to 300 mg TID for better control of her anxiety. Add Ibuprofen for her teeth pain. Get Depakote level in AM. D/C in AM.  Eiden Bagot 09/05/2011, 4:20 PM

## 2011-09-05 NOTE — Progress Notes (Signed)
[facility] MD Progress Note  09/05/2011 2:43 PM  S: "I'm going to [facility] in am. I'm a little anxious about the transition. I started Depakote yesterday. I have had 2 doses of it. I am doing well on Seroquel. Sleep is okay with Seroquel and Trazodone. I don't know how I'm going to handle my panic attacks without lorazepam. If it happens, I will just get on Methadone. I am not even going to worry about it".  O: Seen patient in her room. Packing up belongings. Denies any apparent distress. However, verbalized that she is worried about panic attacks since ativan is discontinued. Informed will be on Neurontin 3 times daily routinely.  Diagnosis:   Axis I: Opiod dependence, Alcohol abuse/dependence Axis II: Deferred Axis III:  Past Medical History  Diagnosis Date  . Anxiety   . Mental disorder   . Depression   . Headache   . Family history of anesthesia complication    Axis IV: other psychosocial or environmental problems Axis V: 65  ADL's:  Intact  Sleep: Fair  Appetite:  Good  Suicidal Ideation:  Plan:  No Intent:  No Means:  No Homicidal Ideation:  Plan:  No Intent:  No Means:  No  AEB (as evidenced by): Per patient's report.  Mental Status Examination/Evaluation: Objective:  Appearance: Casual  Eye Contact::  Good  Speech:  Clear and Coherent  Volume:  Normal  Mood:  Anxious about going to [facility] in am  Affect:  Appropriate  Thought Process:  Coherent  Orientation:  Full  Thought Content:  Rumination  Suicidal Thoughts:  No  Homicidal Thoughts:  No  Memory:  Immediate;   Good Recent;   Good Remote;   Good  Judgement:  Fair  Insight:  Fair  Psychomotor Activity:  Normal  Concentration:  Good  Recall:  Good  Akathisia:  No  Handed:  Right  AIMS (if indicated):     Assets:  Desire for Improvement  Sleep:  Number of Hours: 5.75    Vital Signs:Blood pressure 114/81, pulse 125, temperature 97.8 F (36.6 C), temperature source Oral, resp. rate 16, height 5\' 2"  (1.575 m),  weight 68.04 kg (150 lb), last menstrual period 08/18/2011, SpO2 100.00%. Current Medications: Current Facility-Administered Medications  Medication Dose Route Frequency Provider Last Rate Last Dose  . docusate sodium (COLACE) capsule 100 mg  100 mg Oral BID Alyson Kuroski-Mazzei, DO   100 mg at 09/05/11 0865  . ibuprofen (ADVIL,MOTRIN) tablet 800 mg  800 mg Oral Q8H PRN Alyson Kuroski-Mazzei, DO   800 mg at 09/05/11 1100  . magnesium citrate solution 1 Bottle  1 Bottle Oral Once Mike Craze, MD   1 Bottle at 09/04/11 2201  . magnesium hydroxide (MILK OF MAGNESIA) suspension 30 mL  30 mL Oral Daily PRN Jorje Guild, PA-C   30 mL at 09/03/11 1054  . multivitamin with minerals tablet 1 tablet  1 tablet Oral Daily Jorje Guild, PA-C   1 tablet at 09/05/11 7846  . nicotine (NICODERM CQ - dosed in mg/24 hours) patch 21 mg  21 mg Transdermal Daily Alyson Kuroski-Mazzei, DO   21 mg at 09/05/11 9629  . phenylephrine-shark liver oil-mineral oil-petrolatum (PREPARATION H) rectal ointment   Rectal [id number] Mickeal Skinner, MD      . QUEtiapine (SEROQUEL) tablet 200 mg  200 mg Oral TID Alyson Kuroski-Mazzei, DO   200 mg at 09/05/11 1304  . thiamine (VITAMIN B-1) tablet 100 mg  100 mg Oral Daily Jorje Guild, PA-C  100 mg at 09/05/11 0808  . traZODone (DESYREL) tablet 100 mg  100 mg Oral QHS PRN Alyson Kuroski-Mazzei, DO   100 mg at 09/04/11 2131  . valproic acid (DEPAKENE) 250 MG capsule 250 mg  250 mg Oral BID Alyson Kuroski-Mazzei, DO   250 mg at 09/05/11 9604  . DISCONTD: LORazepam (ATIVAN) tablet 2 mg  2 mg Oral Q6H PRN Alyson Kuroski-Mazzei, DO   2 mg at 09/05/11 5409    Lab Results: No results found for this or any previous visit (from the past 48 hour(s)).  Physical Findings: AIMS:  , ,  ,  ,    CIWA:  CIWA-Ar Total: 6  COWS:  COWS Total Score: 5   Treatment Plan Summary: Daily contact with patient to assess and evaluate symptoms and progress in treatment Medication management  Plan: Discontinue  Ativan. Start Neurontin 100 mg tid. Discharge to [facility] in am. Will receive 2 weeks worth samples, meds ordered. Continue current treatment regimen.  Armandina Stammer I 09/05/2011, 2:43 PM

## 2011-09-05 NOTE — Progress Notes (Signed)
Patient ID: Shannon Dawson, female   DOB: 07-01-1975, 36 y.o.   MRN: [id number] D: Pt. Lying in bed, eyes closed. A: Writer assessed respirations, even, unlabored. R: Staff will monitor q32min for safety.

## 2011-09-05 NOTE — Progress Notes (Signed)
09/05/2011         Time: 1415      Group Topic/Focus: The focus of this group is on discussing various styles of communication and communicating assertively using 'I' (feeling) statements.   Participation Level: None  Participation Quality: Resistant  Affect: Irritable  Cognitive: Oriented  Additional Comments: Patient rude and disrespectful, talking with a female peer throughout much of group, despite redirection. Patient also in and out of the group room after coming into group late to begin with. Patient yelled at a peer who came to group late, blaming the peer for taking all of the NP's time.   Quynn Vilchis 09/05/2011 3:46 PM

## 2011-09-05 NOTE — Progress Notes (Signed)
[facility] Case Management Discharge Plan:  Will you be returning to the same living situation after discharge: No. At discharge, do you have transportation home?:Yes,  [facility] Do you have the ability to pay for your medications:Yes,  [facility]  Interagency Information:     Release of information consent forms completed and in the chart;  Patient's signature needed at discharge.  Patient to Follow up at:  Follow-up Information    Follow up with [facility] on 09/06/2011. (Will pick you up in the AM)    Contact information:   [address]  [place]  [phone]         Patient denies SI/HI:   Yes,  yes    Safety Planning and Suicide Prevention discussed:  Yes,  yes  Barrier to discharge identified:No.  Summary and Recommendations:   Shannon Dawson 09/05/2011, 3:21 PM

## 2011-09-05 NOTE — Progress Notes (Signed)
D) Patient intrusive in other patient's care, observed by MHT going in to a female patient's room x2 this AM. Presents with bright affect.  A) Redirected by staff and limits set. Encouraged to focus on her treatment plan. R) Patient presents with minimal insight into treatment issues.  Joice Lofts RN MS EdS 09/05/2011  9:38 AM

## 2011-09-05 NOTE — Progress Notes (Addendum)
D: Patient cooperative and anxious. Patient excited about discharge tomorrow to [facility] and speaks about coping skills that she will use once she is discharged.  Patient interacting within the milieu. Patient states, "They took me off my ativan so earlier today was not so good for me."  A: Patient reminded she is not to be in other patients rooms. Patient given Trazadone for sleep. Patient attended [employer].  Staff to monitor Q 15 min for safety. Patient offered encouragement and support. R: Patient verbalizes understanding of not going  into other patient rooms.  Patient remains safe.

## 2011-09-05 NOTE — [facility] Suicide Risk Assessment (Signed)
Suicide Risk Assessment  Discharge Assessment     Demographic factors:       Current Mental Status Per Nursing Assessment::   On Admission:    At Discharge:     Current Mental Status Per Physician:  Loss Factors:    Historical Factors:    Risk Reduction Factors:      Continued Clinical Symptoms:  Severe Anxiety and/or Agitation Bipolar Disorder:   Bipolar II Alcohol/Substance Abuse/Dependencies Obsessive-Compulsive Disorder Chronic Pain Previous Psychiatric Diagnoses and Treatments  Discharge Diagnoses:   AXIS I:  Alcohol Abuse, Bipolar, Depressed, Post Traumatic Stress Disorder, Substance Induced Mood Disorder and Opiate Dependence AXIS II:  Deferred AXIS III:   Past Medical History  Diagnosis Date  . Anxiety   . Mental disorder   . Depression   . Headache   . Family history of anesthesia complication    AXIS IV:  other psychosocial or environmental problems, problems related to social environment, problems with access to health care services and problems with primary support group AXIS V:  51-60 moderate symptoms  Cognitive Features That Contribute To Risk:  Closed-mindedness Thought constriction (tunnel vision)    Suicide Risk:  Minimal: No identifiable suicidal ideation.  Patients presenting with no risk factors but with morbid ruminations; may be classified as minimal risk based on the severity of the depressive symptoms  S/O: Patient seen and evaluated. Chart reviewed. Pt likes the Depakote better than the Lilthium.  She says that others tell her that she is acting nicer on the Depakote.  She asks about how high she should go up on that.  She was informed that it will have to depend on how she does on the medication.  She has a plan to go to [facility] for 3 months then [facility] for 2 years.  She requests Ibuprofen for her teeth.  Will order that for her.   Sleep:  Number of Hours: 5.75    Vital Signs:Blood pressure 114/81, pulse 125, temperature 97.8 F (36.6  C), temperature source Oral, resp. rate 16, height 5\' 2"  (1.575 m), weight 68.04 kg (150 lb), last menstrual period 08/18/2011, SpO2 100.00%.  Lab Results:  No results found for this or any previous visit (from the past 48 hour(s)).  Physical Findings: CIWA:  CIWA-Ar Total: 6  COWS:  COWS Total Score: 5   CLINICAL FACTORS: Alcohol & Opioid Dependence with W/D; BPAD, per Hx  RISK REDUCTION FACTORS: What pt has learned from hospital stay is that if she takes the correct medications that she can learn things in group and get better.  Risk of self harm is elevated by her bipolar disorder, her OCD, her PTSD, and her addictions, but she has learned that she has her children and herself to live for.  Risk of harm to others is minimal in that she has not been involved in fights or had any legal charges filed on her.  Pt seen in treatment team where she divulged the above information. The treatment team concluded that she was ready for discharge and had met her goals for an inpatient setting.   PLAN: Discharge home Continue Medication List  As of 09/05/2011  4:41 PM   STOP taking these medications         benzocaine 10 % mucosal gel         TAKE these medications      Indication    cloNIDine 0.1 MG tablet   Commonly known as: CATAPRES   Take 1 tablet (0.1 mg  total) by mouth 3 (three) times daily. For anxiety       DSS 100 MG Caps   Take 100 mg by mouth 2 (two) times daily. Inform patient that this over-the counter medicine: Stool softener       gabapentin 300 MG capsule   Commonly known as: NEURONTIN   Take 1 capsule (300 mg total) by mouth 3 (three) times daily. For anxiety and management of pain.  May be helpful for sleep.       ibuprofen 200 MG tablet   Commonly known as: ADVIL,MOTRIN   Take 1-4 tablets (200-800 mg total) by mouth every 6 (six) hours as needed (for pain). For pain       ibuprofen 400 MG tablet   Commonly known as: ADVIL,MOTRIN   Take 1 tablet (400 mg  total) by mouth 4 (four) times daily as needed (tooth pain).       phenylephrine-shark liver oil-mineral oil-petrolatum 0.25-3-14-71.9 % rectal ointment   Commonly known as: PREPARATION H   Place 1 application rectally 2 (two) times daily as needed. Apply rectally as needed: for hemorroids       QUEtiapine 200 MG tablet   Commonly known as: SEROQUEL   Take 1 tablet (200 mg total) by mouth 3 (three) times daily. For mood control       traZODone 100 MG tablet   Commonly known as: DESYREL   Take 1 tablet (100 mg total) by mouth at bedtime as needed for sleep (for sleep ). For sleep       valproic acid 250 MG capsule   Commonly known as: DEPAKENE   Take 1 capsule (250 mg total) by mouth 2 (two) times daily. For mood stabilization            Follow-up recommendations:  Activities: Resume typical activities Diet: Resume typical diet Other: Follow up with outpatient provider and report any side effects to out patient prescriber.  Plan:  Increase Neurontin to 300 mg TID for better control of her anxiety. Add Ibuprofen for her teeth pain. Get Depakote level in AM. D/C in AM.   Clay[place] Weekly 09/05/2011, 4:39 PM

## 2011-09-05 NOTE — Discharge Instructions (Signed)
Attend 90 meetings in 90 days. Get trusted sponsor from the advise of others or from whomever in meetings seems to make sense, has a proven track record, will hold you responsible for your sobriety, and both expects and insists on total abstinence.  Work the steps HONESTLY with the trusted sponsor. Get obsessed with your recovery by often reminding yourself of how DEADLY this dredged horrible disease of addiction JUST IS. Focus the first month on speaker meetings where you specifically look at how your life has been wrecked by drugs/alcohol and how your life has been similar to that of the speakers.  DOUBLE WINNER!! Strongly consider attending at least 6 Alanon Meetings to help you learn about how your helping others to the exclusion of helping yourself is actually hurting yourself and is actually an addiction to fixing others and that you need to work the 12 Step to Happiness through the [employer]. Al-Anon Family Groups could be helpful with how to deal with substance abusing family and friends. Or your own issues of being in victim role.  There are only 40 Alanon Family Group meetings a week here in [place].  Online are current listing of those meetings @ [email or web address]  There are [employer].  Search on line and there you can learn the format and can access the schedule for yourself.  They ask you to temproarily block call waiting by starting with *70 then their number is [phone]   Anti whatever Alphabet (Where whatever stands for depression, anxiety, pain or BS in your life.)  A lternate between completely different solutions  B ehold beauty  C ommune with nature  D isplay affection thru hugs, words, understanding or Dance to new/different music  E at healthy food (like Fish Oil)  F eed wildlife  G o fishing  H ike in the woods or H unt down someone who has successfully met similar chalanges  I nspire someone else  [employer] or J ump  into a new hobby  K eep a diary of your successes  L isten to soothing music or L augh at yourself  [employer] music/ art/ poetry/ crafts  N otice something different about yourself, others, how others interact/ respond to each other, nature  O utside of yourself and typical way of doing/ thinking  P ick flowers  R andom acts of kindness without being discovered  S pend time on yourself/ Skin for beauty sake nails, teeth, hair, Soak in tub  T alk to a friend  U se grandma's ideas on how to handle things  V ary your routine  W alk  X tend your comfort zone where other's have invited you to go  Y oga/ other forms of meditation  Z ip up and go outside (or go outside of yourself)

## 2011-09-06 NOTE — Progress Notes (Addendum)
(  D) Patient eager for discharge. Denies SI/HI. No access to firearms. Reviewed suicide prevention information and understanding verbalized. Patient leaving for [facility] program. Rates depression at 2/10 and hopelessness as 4/10. Endorses cravings and agitation. Reports sleep as "well", appetite poor, energy level hyper, and ability to pay attention good. (A) Patient encouraged to review coping skills prior to admission. (R) Patient shared that she has a friend, Victorino Dike, that will be supportive at discharge. Joice Lofts RN MS EdS 09/06/2011  9:11 AM

## 2011-09-06 NOTE — Progress Notes (Signed)
Nursing Discharge Note:  D:  Patient discharged to [facility] per MD order.  Patient very eager to be discharged this morning. A: patient was given a 14-day supply of her medications with instructions for use.  She was also given prescriptions for same.  Patient was given belongings from her locker.  She stated she was missing a pair of grey capri pants that had a draw string.  She became very agitated when staff could not locate them.  She stated her mother had brought them to her and staff had held them back because of the draw string.  I offered to let her speak with our asst. Dir. And she refused stating that she would leave without them.  D: patient left agitated with several bags of belongings.  She left ambulatory with [facility] represtative.  She denies SI/HI/AVH.

## 2011-09-07 NOTE — Discharge Summary (Signed)
Physician Discharge Summary Note  Patient:  Shannon Dawson is an 37 y.o., female MRN:  [id number] DOB:  [birth year] Patient phone:  [phone] (home)  Patient address:   [address] [place] [address] [place] [address],   Date of Admission:  08/29/2011 Date of Discharge: 09/06/11  Reason for Admission: Alcohol and heroine detox  Discharge Diagnoses: Principal Problem:  *Alcohol dependence Active Problems:  Opioid dependence  OCD (obsessive compulsive disorder)  Bipolar disorder  Generalized anxiety disorder   Axis Diagnosis:   AXIS I:  Generalized Anxiety Disorder, Obsessive Compulsive Disorder and [place] disorder,Alcohol dependence, Opiod dependence AXIS II:  Deferred AXIS III:   Past Medical History  Diagnosis Date  . Anxiety   . Mental disorder   . Depression   . Headache   . Family history of anesthesia complication    AXIS IV:  other psychosocial or environmental problems AXIS V:  65  Level of Care:  Shannon Dawson  [facility] Course:  This is a voluntary admission for this 36 year old SWF. She presents from Shannon Dawson requesting detox from heroine and alcohol. She reports doing $100-$150 a day of heroin for the past year and says she drinks 4-5 beers a day. Her last use of both was 2 days ago. Shannon Dawson states she has had withdrawal seizure from alcohol 1 time in the past and that was 7 months ago.  She notes that she has had poor sleep, decreased appetite,rates her depression as a 5/10, no SI, no HI, but also notes her anxiety level is 10/10. She denies AH/VH, but states she has increasing thoughts of hopelessness that she rates is a 6/10.  While a patient in this Dawson, Shannon Dawson received detoxification treatment for Opiates, alcohol and benzodiazepine dependence using both the Clonidine and Librium protocols. She also received medication management Seroquel for mood control and Depakote for mood stabilizations. She was enrolled and participated in AA/NA meetings being held in this  unit. She tolerated the treatment regimens without any significant adverse effects and or reactions.  Although she tolerated her treatment regimen, patient continue to insist on being discharged on Ativan because she said her panic attacks can be intolerable. Patient was instructed that discharging her to a treatment facility with a very addictive and controlled substance as ativan is actually undermining the reasons for her hospitalization in the first place. She was informed that this idea indeed does beat the purpose of having to go through detoxification to get off drugs, and turn around to depend on using it for anticipation of anxiety symptoms. She was rather discharged on Shannon Dawson 300 mg tid for anxiety.  Patient is currently being discharged to Shannon Dawson for continuation of her substance abuse treatment in a much prolonged time and manner. Upon discharge, patient adamantly denies suicidal, homicidal ideations, auditory visual hallucinations, delusional thinking and or any withdrawal symptoms. She left Shannon Dawson with all personal belongings in no apparent distress. Transportation provided by Shannon Dawson.     Consults:  None  Significant Diagnostic Studies:  labs: CBC with diff, CMP, Toxicology  Discharge Vitals:   Blood pressure 113/76, pulse 108, temperature 96.6 F (35.9 C), temperature source Oral, resp. rate 20, height 5\' 2"  (1.575 m), weight 68.04 kg (150 lb), last menstrual period 08/18/2011, SpO2 100.00%.  Mental Status Exam: See Mental Status Examination and Suicide Risk Assessment completed by Attending Physician prior to discharge.  Discharge destination:  Other:  Shannon Dawson  Is patient on multiple antipsychotic therapies at discharge:  No   Has Patient had  three or more failed trials of antipsychotic monotherapy by history:  No  Recommended Plan for Multiple Antipsychotic Therapies: NA   Medication List  As of 09/07/2011  3:30 AM   STOP taking these medications         benzocaine 10 % mucosal  gel         TAKE these medications      Indication    cloNIDine 0.1 MG tablet   Commonly known as: CATAPRES   Take 1 tablet (0.1 mg total) by mouth 3 (three) times daily. For anxiety       DSS 100 MG Caps   Take 100 mg by mouth 2 (two) times daily. Inform patient that this over-the counter medicine: Stool softener       gabapentin 300 MG capsule   Commonly known as: NEURONTIN   Take 1 capsule (300 mg total) by mouth 3 (three) times daily. For anxiety and management of pain.  May be helpful for sleep.       ibuprofen 200 MG tablet   Commonly known as: ADVIL,MOTRIN   Take 1-4 tablets (200-800 mg total) by mouth every 6 (six) hours as needed (for pain). For pain       ibuprofen 400 MG tablet   Commonly known as: ADVIL,MOTRIN   Take 1 tablet (400 mg total) by mouth 4 (four) times daily as needed (tooth pain).       phenylephrine-shark liver oil-mineral oil-petrolatum 0.25-3-14-71.9 % rectal ointment   Commonly known as: PREPARATION H   Place 1 application rectally 2 (two) times daily as needed. Apply rectally as needed: for hemorroids       QUEtiapine 200 MG tablet   Commonly known as: SEROQUEL   Take 1 tablet (200 mg total) by mouth 3 (three) times daily. For mood control       traZODone 100 MG tablet   Commonly known as: DESYREL   Take 1 tablet (100 mg total) by mouth at bedtime as needed for sleep (for sleep ). For sleep       valproic acid 250 MG capsule   Commonly known as: DEPAKENE   Take 1 capsule (250 mg total) by mouth 2 (two) times daily. For mood stabilization            Follow-up Information    Follow up with Shannon Dawson on 09/06/2011. (Will pick you up in the AM)    Contact information:   [address]  [phone]         Follow-up recommendations:  Activities as tolerated.  Instructed patient to keep all scheduled follow-up appointments as recommended.   Comments:  Take all your medicines as prescribed. Report any adverse effects to your  outpatient provider. Patient is instructed and cautioned to not engage in alcohol and or illegal drug use while on prescription medicines. In the event od worsening symptoms, call the crisis hot-line, 911 and or go the nearest ED.  SignedArmandina Stammer I 09/07/2011, 3:30 AM

## 2011-09-09 NOTE — Progress Notes (Signed)
Patient Discharge Instructions:  After Visit Summary (AVS):   Faxed to:  09/09/2011 Psychiatric Admission Assessment Note:   Faxed to:  09/09/2011 Suicide Risk Assessment - Discharge Assessment:   Faxed to:  09/09/2011 Faxed/Sent to the Next Level Care provider:  09/09/2011  Faxed to [facility] @ [phone]  Wandra Scot, 09/09/2011, 4:17 PM

## 2015-01-25 ENCOUNTER — Emergency Department (HOSPITAL_COMMUNITY)
Admission: EM | Admit: 2015-01-25 | Discharge: 2015-01-25 | Disposition: A | Discharge status: Home / Self Care | Payer: Self-pay | Attending: Emergency Medicine | Admitting: Emergency Medicine

## 2015-01-25 ENCOUNTER — Encounter (HOSPITAL_COMMUNITY): Payer: Self-pay | Admitting: Emergency Medicine

## 2015-01-25 DIAGNOSIS — Z79899 Other long term (current) drug therapy: Secondary | ICD-10-CM | POA: Insufficient documentation

## 2015-01-25 DIAGNOSIS — N83201 Unspecified ovarian cyst, right side: Secondary | ICD-10-CM

## 2015-01-25 DIAGNOSIS — N83202 Unspecified ovarian cyst, left side: Secondary | ICD-10-CM | POA: Insufficient documentation

## 2015-01-25 DIAGNOSIS — G8929 Other chronic pain: Secondary | ICD-10-CM

## 2015-01-25 DIAGNOSIS — F419 Anxiety disorder, unspecified: Secondary | ICD-10-CM | POA: Insufficient documentation

## 2015-01-25 DIAGNOSIS — F329 Major depressive disorder, single episode, unspecified: Secondary | ICD-10-CM | POA: Insufficient documentation

## 2015-01-25 DIAGNOSIS — Z720 Tobacco use: Secondary | ICD-10-CM | POA: Insufficient documentation

## 2015-01-25 HISTORY — DX: Unspecified ovarian cyst, unspecified side: N83.209

## 2015-01-25 NOTE — Discharge Instructions (Signed)
Follow up with your doctor for continued symptoms Chronic Pain Chronic pain can be defined as pain that is off and on and lasts for 3-6 months or longer. Many things cause chronic pain, which can make it difficult to make a diagnosis. There are many treatment options available for chronic pain. However, finding a treatment that works well for you may require trying various approaches until the right one is found. Many people benefit from a combination of two or more types of treatment to control their pain. SYMPTOMS  Chronic pain can occur anywhere in the body and can range from mild to very severe. Some types of chronic pain include:  Headache.  Low back pain.  Cancer pain.  Arthritis pain.  Neurogenic pain. This is pain resulting from damage to nerves. People with chronic pain may also have other symptoms such as:  Depression.  Anger.  Insomnia.  Anxiety. DIAGNOSIS  Your health care provider will help diagnose your condition over time. In many cases, the initial focus will be on excluding possible conditions that could be causing the pain. Depending on your symptoms, your health care provider may order tests to diagnose your condition. Some of these tests may include:   Blood tests.   CT scan.   MRI.   X-rays.   Ultrasounds.   Nerve conduction studies.  You may need to see a specialist.  TREATMENT  Finding treatment that works well may take time. You may be referred to a pain specialist. He or she may prescribe medicine or therapies, such as:   Mindful meditation or yoga.  Shots (injections) of numbing or pain-relieving medicines into the spine or area of pain.  Local electrical stimulation.  Acupuncture.   Massage therapy.   Aroma, color, light, or sound therapy.   Biofeedback.   Working with a physical therapist to keep from getting stiff.   Regular, gentle exercise.   Cognitive or behavioral therapy.   Group support.  Sometimes,  surgery may be recommended.  HOME CARE INSTRUCTIONS   Take all medicines as directed by your health care provider.   Lessen stress in your life by relaxing and doing things such as listening to calming music.   Exercise or be active as directed by your health care provider.   Eat a healthy diet and include things such as vegetables, fruits, fish, and lean meats in your diet.   Keep all follow-up appointments with your health care provider.   Attend a support group with others suffering from chronic pain. SEEK MEDICAL CARE IF:   Your pain gets worse.   You develop a new pain that was not there before.   You cannot tolerate medicines given to you by your health care provider.   You have new symptoms since your last visit with your health care provider.  SEEK IMMEDIATE MEDICAL CARE IF:   You feel weak.   You have decreased sensation or numbness.   You lose control of bowel or bladder function.   Your pain suddenly gets much worse.   You develop shaking.  You develop chills.  You develop confusion.  You develop chest pain.  You develop shortness of breath.  MAKE SURE YOU:  Understand these instructions.  Will watch your condition.  Will get help right away if you are not doing well or get worse.   This information is not intended to replace advice given to you by your health care provider. Make sure you discuss any questions you have with your  health care provider.   Document Released: 11/24/2001 Document Revised: 11/04/2012 Document Reviewed: 08/28/2012 Elsevier Interactive Patient Education [employer]2016 Elsevier Inc.

## 2015-01-25 NOTE — ED Notes (Addendum)
Recently diagnosed with large cysts on ovaries and cannot get into doctors to get surgery for a couple weeks. States her doctor told her to go to the ER for pain control. Has results of ultrasound with her. Complaining of severe lower back pain and nausea. States she got 5 days of pain meds and has gone through them as this is the 6th day.

## 2015-01-25 NOTE — ED Provider Notes (Signed)
CSN: [id number]     Arrival date & time 01/25/15  1006 History   First MD Initiated Contact with Patient 01/25/15 1034     Chief Complaint  Patient presents with  . Back Pain  . Abdominal Pain  . Ovarian Cyst     (Consider location/radiation/quality/duration/timing/severity/associated sxs/prior Treatment) HPI Comments: Pt comes in with c/o pain from her ovarian cysts. She states that she was diagnosed on 11/3 and she has an appointment scheduled with an ob in 2 weeks. She states that she needs something for pain. She denies that the pain has changed. She states that her doctor referred her to a pain clinic but she can't get in their either for a little while.   The history is provided by the patient. No language interpreter was used.    Past Medical History  Diagnosis Date  . Anxiety   . Mental disorder   . Depression   . Headache(784.0)   . Family history of anesthesia complication   . Ovarian cyst    Past Surgical History  Procedure Laterality Date  . Cholecystectomy  2010   History reviewed. No pertinent family history. Social History  Substance Use Topics  . Smoking status: Current Every Day Smoker -- 1.50 packs/day for 18 years    Types: Cigarettes  . Smokeless tobacco: None  . Alcohol Use: Yes     Comment: Drinks beer/liquor every day   OB History    No data available     Review of Systems  All other systems reviewed and are negative.     Allergies  Vicodin  Home Medications   Prior to Admission medications   Medication Sig Start Date End Date Taking? Authorizing Provider  cloNIDine (CATAPRES) 0.1 MG tablet Take 1 tablet (0.1 mg total) by mouth 3 (three) times daily. For anxiety 09/05/11 09/04/12  Mike CrazeEdwin O Walker, MD  gabapentin (NEURONTIN) 300 MG capsule Take 1 capsule (300 mg total) by mouth 3 (three) times daily. For anxiety and management of pain.  May be helpful for sleep. 09/05/11 09/04/12  Mike CrazeEdwin O Walker, MD  ibuprofen (ADVIL,MOTRIN) 200 MG tablet  Take 1-4 tablets (200-800 mg total) by mouth every 6 (six) hours as needed (for pain). For pain 09/05/11   Sanjuana KavaAgnes I Nwoko, NP  phenylephrine-shark liver oil-mineral oil-petrolatum (PREPARATION H) 0.25-3-14-71.9 % rectal ointment Place 1 application rectally 2 (two) times daily as needed. Apply rectally as needed: for hemorroids 09/05/11   Sanjuana KavaAgnes I Nwoko, NP  QUEtiapine (SEROQUEL) 200 MG tablet Take 1 tablet (200 mg total) by mouth 3 (three) times daily. For mood control 09/05/11 10/05/11  Sanjuana KavaAgnes I Nwoko, NP  traZODone (DESYREL) 100 MG tablet Take 1 tablet (100 mg total) by mouth at bedtime as needed for sleep (for sleep ). For sleep 09/05/11 10/05/11  Sanjuana KavaAgnes I Nwoko, NP  valproic acid (DEPAKENE) 250 MG capsule Take 1 capsule (250 mg total) by mouth 2 (two) times daily. For mood stabilization 09/05/11 09/04/12  Sanjuana KavaAgnes I Nwoko, NP   BP 157/100 mmHg  Pulse 112  Temp(Src) 98.3 F (36.8 C) (Oral)  SpO2 100% Physical Exam  Constitutional: She is oriented to person, place, and time. She appears well-developed and well-nourished.  Cardiovascular: Normal rate, regular rhythm and normal heart sounds.   Pulmonary/Chest: Effort normal and breath sounds normal.  Abdominal: Soft. Bowel sounds are normal. There is tenderness.  Musculoskeletal: Normal range of motion.  Neurological: She is alert and oriented to person, place, and time.  Skin: Skin is warm  and dry.  Psychiatric: She has a normal mood and affect.  Nursing note and vitals reviewed.   ED Course  Procedures (including critical care time) Labs Review Labs Reviewed - No data to display  Imaging Review No results found. I have personally reviewed and evaluated these images and lab results as part of my medical decision-making.   EKG Interpretation None      MDM   Final diagnoses:  Cysts of both ovaries  Chronic pain    Pt has had 60 tablets in the last 8 days according to the drug database. Pt has history of overdose. Offered pt toradol and  pt refused.     Teressa Lower, NP 01/25/15 1054  Nelva Nay, MD 01/25/15 1056

## 2015-05-18 ENCOUNTER — Inpatient Hospital Stay (HOSPITAL_COMMUNITY)
Admission: EM | Admit: 2015-05-18 | Discharge: 2015-05-19 | DRG: 694 | Disposition: A | Discharge status: Home / Self Care | Payer: Self-pay | Attending: Urology | Admitting: Urology

## 2015-05-18 ENCOUNTER — Encounter (HOSPITAL_COMMUNITY)
Admission: EM | Disposition: A | Discharge status: Home / Self Care | Payer: Self-pay | Source: Home / Self Care | Attending: Urology

## 2015-05-18 ENCOUNTER — Encounter (HOSPITAL_COMMUNITY): Payer: Self-pay | Admitting: Emergency Medicine

## 2015-05-18 ENCOUNTER — Emergency Department (HOSPITAL_COMMUNITY): Payer: Self-pay

## 2015-05-18 DIAGNOSIS — N1330 Unspecified hydronephrosis: Secondary | ICD-10-CM | POA: Diagnosis present

## 2015-05-18 DIAGNOSIS — N201 Calculus of ureter: Secondary | ICD-10-CM

## 2015-05-18 DIAGNOSIS — Z9049 Acquired absence of other specified parts of digestive tract: Secondary | ICD-10-CM

## 2015-05-18 DIAGNOSIS — N390 Urinary tract infection, site not specified: Secondary | ICD-10-CM

## 2015-05-18 DIAGNOSIS — F419 Anxiety disorder, unspecified: Secondary | ICD-10-CM | POA: Diagnosis present

## 2015-05-18 DIAGNOSIS — N202 Calculus of kidney with calculus of ureter: Principal | ICD-10-CM | POA: Diagnosis present

## 2015-05-18 DIAGNOSIS — F17210 Nicotine dependence, cigarettes, uncomplicated: Secondary | ICD-10-CM | POA: Diagnosis present

## 2015-05-18 DIAGNOSIS — Z885 Allergy status to narcotic agent status: Secondary | ICD-10-CM

## 2015-05-18 DIAGNOSIS — N200 Calculus of kidney: Secondary | ICD-10-CM

## 2015-05-18 DIAGNOSIS — F329 Major depressive disorder, single episode, unspecified: Secondary | ICD-10-CM | POA: Diagnosis present

## 2015-05-18 DIAGNOSIS — Z79899 Other long term (current) drug therapy: Secondary | ICD-10-CM

## 2015-05-18 DIAGNOSIS — R509 Fever, unspecified: Secondary | ICD-10-CM | POA: Diagnosis present

## 2015-05-18 LAB — URINE MICROSCOPIC-ADD ON

## 2015-05-18 LAB — URINALYSIS, ROUTINE W REFLEX MICROSCOPIC
BILIRUBIN URINE: NEGATIVE
Glucose, UA: NEGATIVE mg/dL
Ketones, ur: NEGATIVE mg/dL
Nitrite: NEGATIVE
PROTEIN: 100 mg/dL — AB
Specific Gravity, Urine: 1.018 (ref 1.005–1.030)
pH: 6 (ref 5.0–8.0)

## 2015-05-18 LAB — COMPREHENSIVE METABOLIC PANEL
ALBUMIN: 2.9 g/dL — AB (ref 3.5–5.0)
ALK PHOS: 73 U/L (ref 38–126)
ALT: 11 U/L — AB (ref 14–54)
AST: 17 U/L (ref 15–41)
Anion gap: 16 — ABNORMAL HIGH (ref 5–15)
BUN: 9 mg/dL (ref 6–20)
CALCIUM: 9.5 mg/dL (ref 8.9–10.3)
CO2: 20 mmol/L — AB (ref 22–32)
CREATININE: 1.15 mg/dL — AB (ref 0.44–1.00)
Chloride: 101 mmol/L (ref 101–111)
GFR calc non Af Amer: 59 mL/min — ABNORMAL LOW (ref >60)
GLUCOSE: 151 mg/dL — AB (ref 65–99)
Potassium: 3.4 mmol/L — ABNORMAL LOW (ref 3.5–5.1)
SODIUM: 137 mmol/L (ref 135–145)
Total Bilirubin: 0.3 mg/dL (ref 0.3–1.2)
Total Protein: 7.1 g/dL (ref 6.5–8.1)

## 2015-05-18 LAB — CBC
HCT: 36.2 % (ref 36.0–46.0)
Hemoglobin: 12 g/dL (ref 12.0–15.0)
MCH: 30.5 pg (ref 26.0–34.0)
MCHC: 33.1 g/dL (ref 30.0–36.0)
MCV: 92.1 fL (ref 78.0–100.0)
PLATELETS: 248 10*3/uL (ref 150–400)
RBC: 3.93 MIL/uL (ref 3.87–5.11)
RDW: 13.5 % (ref 11.5–15.5)
WBC: 7.3 10*3/uL (ref 4.0–10.5)

## 2015-05-18 LAB — I-STAT CG4 LACTIC ACID, ED: Lactic Acid, Venous: 1.98 mmol/L (ref 0.5–2.0)

## 2015-05-18 LAB — I-STAT BETA HCG BLOOD, ED (MC, WL, AP ONLY)

## 2015-05-18 LAB — LIPASE, BLOOD: Lipase: 16 U/L (ref 11–51)

## 2015-05-18 SURGERY — CYSTOSCOPY, WITH RETROGRADE PYELOGRAM AND URETERAL STENT INSERTION
Anesthesia: General | Laterality: Right

## 2015-05-18 MED ORDER — IOHEXOL 300 MG/ML  SOLN
100.0000 mL | Freq: Once | INTRAMUSCULAR | Status: AC | PRN
  Administered 2015-05-18: 100 mL via INTRAVENOUS

## 2015-05-18 MED ORDER — ACETAMINOPHEN 325 MG PO TABS
ORAL_TABLET | ORAL | Status: AC
  Filled 2015-05-18: qty 2

## 2015-05-18 MED ORDER — HYDROMORPHONE HCL 1 MG/ML IJ SOLN
1.0000 mg | Freq: Once | INTRAMUSCULAR | Status: AC
  Administered 2015-05-18: 1 mg via INTRAVENOUS
  Filled 2015-05-18: qty 1

## 2015-05-18 MED ORDER — SODIUM CHLORIDE 0.9 % IV BOLUS (SEPSIS)
1000.0000 mL | Freq: Once | INTRAVENOUS | Status: AC
  Administered 2015-05-18: 1000 mL via INTRAVENOUS

## 2015-05-18 MED ORDER — DEXTROSE 5 % IV SOLN
1.0000 g | Freq: Once | INTRAVENOUS | Status: AC
  Administered 2015-05-18: 1 g via INTRAVENOUS
  Filled 2015-05-18: qty 10

## 2015-05-18 MED ORDER — FENTANYL CITRATE (PF) 100 MCG/2ML IJ SOLN
100.0000 ug | Freq: Once | INTRAMUSCULAR | Status: AC
Start: 2015-05-18 — End: 2015-05-18
  Administered 2015-05-18: 100 ug via INTRAVENOUS
  Filled 2015-05-18: qty 2

## 2015-05-18 MED ORDER — ACETAMINOPHEN 325 MG PO TABS
650.0000 mg | ORAL_TABLET | Freq: Once | ORAL | Status: AC
  Administered 2015-05-18: 650 mg via ORAL

## 2015-05-18 MED ORDER — ONDANSETRON HCL 4 MG/2ML IJ SOLN
4.0000 mg | Freq: Once | INTRAMUSCULAR | Status: AC
  Administered 2015-05-18: 4 mg via INTRAVENOUS
  Filled 2015-05-18: qty 2

## 2015-05-18 NOTE — ED Notes (Signed)
Patient transported to CT 

## 2015-05-18 NOTE — ED Notes (Signed)
Pt states for the last 3 days she has been having right sided abd pain going into her back. Pt also c/o of chills and fevers. Pt reports nausea no vomiting today.

## 2015-05-18 NOTE — ED Provider Notes (Signed)
CSN: [id number]     Arrival date & time 05/18/15  1909 History   First MD Initiated Contact with Patient 05/18/15 2019     Chief Complaint  Patient presents with  . Abdominal Pain  . Flank Pain     (Consider location/radiation/quality/duration/timing/severity/associated sxs/prior Treatment) HPI  40 year old female presents with right lower abdominal pain for 3 days. Yesterday developed a fever and today her temperature went up to 104. Has been having nausea and vomiting the last couple days but now just feels nauseated. No urinary symptoms. Pain also is in her right back. Has a history of ovarian cysts, right greater than left, but states this feels different. Patient rates her pain as severe. Is now starting to have chills.  Past Medical History  Diagnosis Date  . Anxiety   . Mental disorder   . Depression   . Headache(784.0)   . Family history of anesthesia complication   . Ovarian cyst    Past Surgical History  Procedure Laterality Date  . Cholecystectomy  2010   No family history on file. Social History  Substance Use Topics  . Smoking status: Current Every Day Smoker -- 1.50 packs/day for 18 years    Types: Cigarettes  . Smokeless tobacco: None  . Alcohol Use: Yes     Comment: Drinks beer/liquor every day   OB History    No data available     Review of Systems  Constitutional: Positive for fever and chills.  Respiratory: Negative for cough and shortness of breath.   Gastrointestinal: Positive for nausea, vomiting and abdominal pain. Negative for diarrhea.  Genitourinary: Negative for dysuria and hematuria.  Musculoskeletal: Positive for back pain.  All other systems reviewed and are negative.     Allergies  Vicodin  Home Medications   Prior to Admission medications   Medication Sig Start Date End Date Taking? Authorizing Provider  cloNIDine (CATAPRES) 0.1 MG tablet Take 1 tablet (0.1 mg total) by mouth 3 (three) times daily. For anxiety 09/05/11 09/04/12   Mike Craze, MD  gabapentin (NEURONTIN) 300 MG capsule Take 1 capsule (300 mg total) by mouth 3 (three) times daily. For anxiety and management of pain.  May be helpful for sleep. 09/05/11 09/04/12  Mike Craze, MD  ibuprofen (ADVIL,MOTRIN) 200 MG tablet Take 1-4 tablets (200-800 mg total) by mouth every 6 (six) hours as needed (for pain). For pain 09/05/11   Sanjuana Kava, NP  phenylephrine-shark liver oil-mineral oil-petrolatum (PREPARATION H) 0.25-3-14-71.9 % rectal ointment Place 1 application rectally 2 (two) times daily as needed. Apply rectally as needed: for hemorroids 09/05/11   Sanjuana Kava, NP  QUEtiapine (SEROQUEL) 200 MG tablet Take 1 tablet (200 mg total) by mouth 3 (three) times daily. For mood control 09/05/11 10/05/11  Sanjuana Kava, NP  traZODone (DESYREL) 100 MG tablet Take 1 tablet (100 mg total) by mouth at bedtime as needed for sleep (for sleep ). For sleep 09/05/11 10/05/11  Sanjuana Kava, NP  valproic acid (DEPAKENE) 250 MG capsule Take 1 capsule (250 mg total) by mouth 2 (two) times daily. For mood stabilization 09/05/11 09/04/12  Sanjuana Kava, NP   Pulse 140  Temp(Src) 102.1 F (38.9 C) (Oral)  Resp 16  Ht 5\' 4"  (1.626 m)  Wt 150 lb (68.04 kg)  BMI 25.73 kg/m2  SpO2 97% Physical Exam  Constitutional: She is oriented to person, place, and time. She appears well-developed and well-nourished.  chills  HENT:  Head: Normocephalic and  atraumatic.  Right Ear: External ear normal.  Left Ear: External ear normal.  Nose: Nose normal.  Eyes: Right eye exhibits no discharge. Left eye exhibits no discharge.  Cardiovascular: Regular rhythm and normal heart sounds.  Tachycardia present.   Pulmonary/Chest: Effort normal and breath sounds normal.  Abdominal: Soft. There is tenderness in the right upper quadrant and right lower quadrant. There is CVA tenderness (right).  Neurological: She is alert and oriented to person, place, and time.  Skin: Skin is warm and dry.  Nursing note  and vitals reviewed.   ED Course  Procedures (including critical care time) Labs Review Labs Reviewed  COMPREHENSIVE METABOLIC PANEL - Abnormal; Notable for the following:    Potassium 3.4 (*)    CO2 20 (*)    Glucose, Bld 151 (*)    Creatinine, Ser 1.15 (*)    Albumin 2.9 (*)    ALT 11 (*)    GFR calc non Af Amer 59 (*)    Anion gap 16 (*)    All other components within normal limits  URINALYSIS, ROUTINE W REFLEX MICROSCOPIC (NOT AT [facility]) - Abnormal; Notable for the following:    Color, Urine AMBER (*)    APPearance CLOUDY (*)    Hgb urine dipstick LARGE (*)    Protein, ur 100 (*)    Leukocytes, UA LARGE (*)    All other components within normal limits  URINE MICROSCOPIC-ADD ON - Abnormal; Notable for the following:    Squamous Epithelial / LPF 6-30 (*)    Bacteria, UA MANY (*)    All other components within normal limits  LIPASE, BLOOD  CBC  I-STAT BETA HCG BLOOD, ED ([facility], [facility], [facility] ONLY)  I-STAT CG4 LACTIC ACID, ED    Imaging Review Ct Abdomen Pelvis W Contrast  05/18/2015  CLINICAL DATA:  Acute onset right lower quadrant abdominal pain, radiating to the back. Fever and chills. Initial encounter. EXAM: CT ABDOMEN AND PELVIS WITH CONTRAST TECHNIQUE: Multidetector CT imaging of the abdomen and pelvis was performed using the standard protocol following bolus administration of intravenous contrast. CONTRAST:  OMNIPAQUE IOHEXOL 300 MG/ML  SOLN COMPARISON:  CT of the chest, abdomen and pelvis performed 07/18/2009 FINDINGS: Mild bibasilar atelectasis is noted. Trace pericardial fluid remains within normal limits. The liver and spleen are unremarkable in appearance. The patient is status post cholecystectomy, with clips noted at the gallbladder fossa. The pancreas and adrenal glands are unremarkable. There is mild-to-moderate right-sided hydronephrosis, with an obstructing 7 x 7 mm stone noted at the proximal right ureter, just below the right renal pelvis. A nonobstructing 7 mm stone  is noted at the lower pole of the right kidney. Additional smaller nonobstructing left renal stones are seen. There is a somewhat complex mildly hypoattenuating mass at the interpole region of the right kidney, measuring 2.3 cm. This is difficult to fully assess, due to poor enhancement of the right kidney secondary to the patient's hydronephrosis. No free fluid is identified. The small bowel is unremarkable in appearance. The stomach is within normal limits. No acute vascular abnormalities are seen. Minimal calcification is noted at the distal abdominal aorta. A retroaortic left renal vein is noted. The appendix is normal in caliber, without evidence of appendicitis. The colon is unremarkable in appearance. The bladder is mildly distended and grossly unremarkable. A large right adnexal cystic lesion is noted, measuring 10.6 x 9.6 x 8.7 cm. The left ovary is unremarkable in appearance. The uterus is grossly unremarkable, though the cervix  is difficult to fully assess. No inguinal lymphadenopathy is seen. No acute osseous abnormalities are identified. IMPRESSION: 1. Mild to moderate right-sided hydronephrosis, with an obstructing 7 x 7 mm stone at the proximal right ureter, just below the right renal pelvis. 2. Somewhat complex mildly hypoattenuating mass at the interpole region of the right kidney, measuring 2.3 cm. This is difficult to fully assess, due to poor enhancement of the right kidney secondary to the patient's hydronephrosis. MRI of the abdomen is recommended for further evaluation, to exclude malignancy; ultrasound could be considered, but may not provide sufficient confirmation in this case. 3. Large right adnexal cystic lesion, measuring 10.6 x 9.6 x 8.7 cm. This could be further assessed on pelvic ultrasound, or if the patient is undergoing abdominal MRI, concurrent pelvic MRI could be considered. 4. Nonobstructing bilateral renal stones, measuring up to 7 mm in size. 5. Mild bibasilar atelectasis  noted. Electronically Signed   By: Roanna Raider M.D.   On: 05/18/2015 22:53   I have personally reviewed and evaluated these images and lab results as part of my medical decision-making.   EKG Interpretation None      MDM   Final diagnoses:  Right ureteral stone  UTI (lower urinary tract infection)    Patient appears to have an infected right proximal ureteral stone. No evidence of appendicitis which was primary concern originally. Given significant leukocytes in urine, this is concern for an infection combined with her fever. She is tachycardic but her WBC and lactic acid are normal. She was given IV fluids, IV pain medicine, and feels much better. Discussed with urology on call, Dr. Neva Seat, who recommends transfer to the [facility] emergency department for him to evaluate and place a stent for an infected stone. Patient agrees to transfer. EDP at [facility], Dr. Rennis Chris, made aware of patient.    Pricilla Loveless, MD 05/18/15 [phone]

## 2015-05-19 ENCOUNTER — Emergency Department (HOSPITAL_COMMUNITY): Payer: Self-pay | Admitting: Anesthesiology

## 2015-05-19 ENCOUNTER — Encounter (HOSPITAL_COMMUNITY): Payer: Self-pay

## 2015-05-19 ENCOUNTER — Encounter (HOSPITAL_COMMUNITY)
Admission: EM | Disposition: A | Discharge status: Home / Self Care | Payer: Self-pay | Source: Home / Self Care | Attending: Urology

## 2015-05-19 ENCOUNTER — Inpatient Hospital Stay (HOSPITAL_COMMUNITY): Payer: Self-pay

## 2015-05-19 DIAGNOSIS — N200 Calculus of kidney: Secondary | ICD-10-CM

## 2015-05-19 HISTORY — PX: CYSTOSCOPY WITH RETROGRADE PYELOGRAM, URETEROSCOPY AND STENT PLACEMENT: SHX5789

## 2015-05-19 LAB — URINE MICROSCOPIC-ADD ON

## 2015-05-19 LAB — CBC
HCT: 31.9 % — ABNORMAL LOW (ref 36.0–46.0)
Hemoglobin: 10.4 g/dL — ABNORMAL LOW (ref 12.0–15.0)
MCH: 31 pg (ref 26.0–34.0)
MCHC: 32.6 g/dL (ref 30.0–36.0)
MCV: 95.2 fL (ref 78.0–100.0)
PLATELETS: 240 10*3/uL (ref 150–400)
RBC: 3.35 MIL/uL — AB (ref 3.87–5.11)
RDW: 13.8 % (ref 11.5–15.5)
WBC: 6.3 10*3/uL (ref 4.0–10.5)

## 2015-05-19 LAB — BASIC METABOLIC PANEL
ANION GAP: 8 (ref 5–15)
BUN: 9 mg/dL (ref 6–20)
CALCIUM: 8.4 mg/dL — AB (ref 8.9–10.3)
CO2: 22 mmol/L (ref 22–32)
Chloride: 109 mmol/L (ref 101–111)
Creatinine, Ser: 0.95 mg/dL (ref 0.44–1.00)
GFR calc Af Amer: >60 mL/min (ref >60)
Glucose, Bld: 115 mg/dL — ABNORMAL HIGH (ref 65–99)
POTASSIUM: 3.8 mmol/L (ref 3.5–5.1)
SODIUM: 139 mmol/L (ref 135–145)

## 2015-05-19 LAB — URINALYSIS, ROUTINE W REFLEX MICROSCOPIC
Bilirubin Urine: NEGATIVE
GLUCOSE, UA: NEGATIVE mg/dL
KETONES UR: NEGATIVE mg/dL
Nitrite: NEGATIVE
PROTEIN: NEGATIVE mg/dL
Specific Gravity, Urine: 1.041 — ABNORMAL HIGH (ref 1.005–1.030)
pH: 6.5 (ref 5.0–8.0)

## 2015-05-19 SURGERY — CYSTOURETEROSCOPY, WITH RETROGRADE PYELOGRAM AND STENT INSERTION
Anesthesia: General | Site: Ureter | Laterality: Right

## 2015-05-19 MED ORDER — OXYCODONE HCL 5 MG PO TABS
5.0000 mg | ORAL_TABLET | ORAL | Status: DC | PRN
  Administered 2015-05-19 (×3): 10 mg via ORAL
  Filled 2015-05-19 (×3): qty 2

## 2015-05-19 MED ORDER — HYDROCODONE-ACETAMINOPHEN 5-325 MG PO TABS: 1.0000 | ORAL_TABLET | ORAL | Status: DC | PRN

## 2015-05-19 MED ORDER — VALPROIC ACID 250 MG PO CAPS
250.0000 mg | ORAL_CAPSULE | Freq: Two times a day (BID) | ORAL | Status: DC
  Filled 2015-05-19 (×2): qty 1

## 2015-05-19 MED ORDER — KETOROLAC TROMETHAMINE 30 MG/ML IJ SOLN
INTRAMUSCULAR | Status: DC | PRN
  Administered 2015-05-19: 30 mg via INTRAVENOUS

## 2015-05-19 MED ORDER — OXYBUTYNIN CHLORIDE 5 MG PO TABS: 5.0000 mg | ORAL_TABLET | Freq: Three times a day (TID) | ORAL | Status: DC | PRN

## 2015-05-19 MED ORDER — FENTANYL CITRATE (PF) 100 MCG/2ML IJ SOLN
INTRAMUSCULAR | Status: DC | PRN
  Administered 2015-05-19 (×2): 50 ug via INTRAVENOUS

## 2015-05-19 MED ORDER — PROPOFOL 10 MG/ML IV BOLUS
INTRAVENOUS | Status: DC | PRN
  Administered 2015-05-19: 150 mg via INTRAVENOUS

## 2015-05-19 MED ORDER — HYDROMORPHONE HCL 1 MG/ML IJ SOLN
INTRAMUSCULAR | Status: AC
  Administered 2015-05-19: 04:00:00
  Filled 2015-05-19: qty 1

## 2015-05-19 MED ORDER — HYDROMORPHONE HCL 1 MG/ML IJ SOLN
0.2500 mg | INTRAMUSCULAR | Status: DC | PRN
  Administered 2015-05-19 (×2): 0.5 mg via INTRAVENOUS

## 2015-05-19 MED ORDER — DEXTROSE-NACL 5-0.45 % IV SOLN: INTRAVENOUS | Status: DC

## 2015-05-19 MED ORDER — ONDANSETRON HCL 4 MG/2ML IJ SOLN
INTRAMUSCULAR | Status: DC | PRN
  Administered 2015-05-19: 4 mg via INTRAVENOUS

## 2015-05-19 MED ORDER — DEXTROSE 5 % IV SOLN
1.0000 g | INTRAVENOUS | Status: DC
  Filled 2015-05-19: qty 10

## 2015-05-19 MED ORDER — CLONIDINE HCL 0.1 MG PO TABS
0.1000 mg | ORAL_TABLET | Freq: Three times a day (TID) | ORAL | Status: DC
  Filled 2015-05-19 (×3): qty 1

## 2015-05-19 MED ORDER — SUCCINYLCHOLINE CHLORIDE 20 MG/ML IJ SOLN
INTRAMUSCULAR | Status: DC | PRN
  Administered 2015-05-19: 100 mg via INTRAVENOUS

## 2015-05-19 MED ORDER — IOHEXOL 300 MG/ML  SOLN
INTRAMUSCULAR | Status: DC | PRN
  Administered 2015-05-19: 9 mL via URETHRAL

## 2015-05-19 MED ORDER — FLUCONAZOLE IN SODIUM CHLORIDE 400-0.9 MG/200ML-% IV SOLN
400.0000 mg | INTRAVENOUS | Status: AC
  Administered 2015-05-19: 400 mg via INTRAVENOUS
  Filled 2015-05-19: qty 200

## 2015-05-19 MED ORDER — LIDOCAINE HCL (CARDIAC) 20 MG/ML IV SOLN
INTRAVENOUS | Status: DC | PRN
  Administered 2015-05-19: 100 mg via INTRAVENOUS

## 2015-05-19 MED ORDER — PROMETHAZINE HCL 25 MG/ML IJ SOLN: 6.2500 mg | INTRAMUSCULAR | Status: DC | PRN

## 2015-05-19 MED ORDER — SODIUM CHLORIDE 0.9 % IV SOLN
INTRAVENOUS | Status: DC
  Administered 2015-05-19 (×2): via INTRAVENOUS

## 2015-05-19 MED ORDER — LACTATED RINGERS IV SOLN
INTRAVENOUS | Status: DC | PRN
  Administered 2015-05-19: 02:00:00 via INTRAVENOUS

## 2015-05-19 MED ORDER — GABAPENTIN 300 MG PO CAPS
300.0000 mg | ORAL_CAPSULE | Freq: Three times a day (TID) | ORAL | Status: DC
  Filled 2015-05-19 (×3): qty 1

## 2015-05-19 MED ORDER — KETOROLAC TROMETHAMINE 30 MG/ML IJ SOLN: 30.0000 mg | Freq: Once | INTRAMUSCULAR | Status: DC | PRN

## 2015-05-19 MED ORDER — BELLADONNA ALKALOIDS-OPIUM 16.2-60 MG RE SUPP
RECTAL | Status: AC
  Filled 2015-05-19: qty 1

## 2015-05-19 MED ORDER — MORPHINE SULFATE (PF) 2 MG/ML IV SOLN
2.0000 mg | INTRAVENOUS | Status: DC | PRN
  Administered 2015-05-19: 2 mg via INTRAVENOUS
  Filled 2015-05-19: qty 1

## 2015-05-19 MED ORDER — BELLADONNA ALKALOIDS-OPIUM 16.2-60 MG RE SUPP
RECTAL | Status: DC | PRN
  Administered 2015-05-19: 1 via RECTAL

## 2015-05-19 MED ORDER — SENNOSIDES-DOCUSATE SODIUM 8.6-50 MG PO TABS: 2.0000 | ORAL_TABLET | Freq: Every day | ORAL | Status: DC

## 2015-05-19 MED ORDER — CIPROFLOXACIN HCL 500 MG PO TABS: 500.0000 mg | ORAL_TABLET | Freq: Two times a day (BID) | ORAL | Status: DC

## 2015-05-19 MED ORDER — QUETIAPINE FUMARATE 200 MG PO TABS
200.0000 mg | ORAL_TABLET | Freq: Three times a day (TID) | ORAL | Status: DC
  Filled 2015-05-19 (×3): qty 1

## 2015-05-19 MED ORDER — HYDROMORPHONE HCL 1 MG/ML IJ SOLN
1.0000 mg | Freq: Once | INTRAMUSCULAR | Status: AC
  Administered 2015-05-19: 1 mg via INTRAVENOUS
  Filled 2015-05-19: qty 1

## 2015-05-19 MED ORDER — ACETAMINOPHEN 500 MG PO TABS
1000.0000 mg | ORAL_TABLET | Freq: Four times a day (QID) | ORAL | Status: DC
  Administered 2015-05-19: 1000 mg via ORAL
  Filled 2015-05-19 (×2): qty 2

## 2015-05-19 MED ORDER — TRAZODONE HCL 50 MG PO TABS: 100.0000 mg | ORAL_TABLET | Freq: Every evening | ORAL | Status: DC | PRN

## 2015-05-19 MED ORDER — DEXAMETHASONE SODIUM PHOSPHATE 10 MG/ML IJ SOLN
INTRAMUSCULAR | Status: DC | PRN
  Administered 2015-05-19: 10 mg via INTRAVENOUS

## 2015-05-19 MED ORDER — HYDROMORPHONE HCL 1 MG/ML IJ SOLN
INTRAMUSCULAR | Status: DC | PRN
  Administered 2015-05-19 (×2): 1 mg via INTRAVENOUS

## 2015-05-19 MED ORDER — ONDANSETRON HCL 4 MG/2ML IJ SOLN: 4.0000 mg | INTRAMUSCULAR | Status: DC | PRN

## 2015-05-19 SURGICAL SUPPLY — 16 items
BAG URO CATCHER STRL LF (MISCELLANEOUS) ×3 IMPLANT
BASKET DAKOTA 1.9FR 11X120 (BASKET) IMPLANT
CATH INTERMIT  6FR 70CM (CATHETERS) ×3 IMPLANT
CLOTH BEACON ORANGE TIMEOUT ST (SAFETY) ×3 IMPLANT
GLOVE BIOGEL M 8.0 STRL (GLOVE) ×1 IMPLANT
GLOVE SURG SS PI 8.0 STRL IVOR (GLOVE) ×4 IMPLANT
GOWN STRL REUS W/TWL LRG LVL3 (GOWN DISPOSABLE) ×6 IMPLANT
GUIDEWIRE ANG ZIPWIRE 038X150 (WIRE) IMPLANT
GUIDEWIRE STR DUAL SENSOR (WIRE) ×3 IMPLANT
MANIFOLD NEPTUNE II (INSTRUMENTS) ×3 IMPLANT
PACK CYSTO (CUSTOM PROCEDURE TRAY) ×3 IMPLANT
SHEATH ACCESS URETERAL 38CM (SHEATH) IMPLANT
STENT URET 6FRX24 CONTOUR (STENTS) ×2 IMPLANT
TRAY FOLEY BAG SILVER LF 16FR (CATHETERS) ×2 IMPLANT
TUBING CONNECTING 10 (TUBING) ×2 IMPLANT
TUBING CONNECTING 10' (TUBING) ×1

## 2015-05-19 NOTE — Anesthesia Postprocedure Evaluation (Signed)
Anesthesia Post Note  Patient: Shannon SnareMary Dawson  Procedure(s) Performed: Procedure(s) (LRB): CYSTOSCOPY WITH RETROGRADE PYELOGRAM, URETEROSCOPY AND STENT PLACEMENT (Right)  Patient location during evaluation: PACU Anesthesia Type: General Level of consciousness: awake and alert Pain management: pain level controlled Vital Signs Assessment: post-procedure vital signs reviewed and stable Respiratory status: spontaneous breathing, nonlabored ventilation and respiratory function stable Cardiovascular status: blood pressure returned to baseline and stable Postop Assessment: no signs of nausea or vomiting Anesthetic complications: no    Last Vitals:  Filed Vitals:   05/19/15 1054 05/19/15 1431  BP:  132/77  Pulse:    Temp: 36.6 C 36.5 C  Resp:  15    Last Pain:  Filed Vitals:   05/19/15 1626  PainSc: 0-No pain                 Shannon Dawson A

## 2015-05-19 NOTE — ED Notes (Signed)
Bed: [id number] Expected date:  Expected time:  Means of arrival:  [facility]

## 2015-05-19 NOTE — ED Notes (Signed)
Pt transported by [facility]. Arrives ambulatory, A&Ox4. Pt requesting pain medicine at this time.

## 2015-05-19 NOTE — ED Notes (Signed)
Transported to OR. [facility] with [facility]

## 2015-05-19 NOTE — Progress Notes (Signed)
Discharge instructions given to pt, verbalized understanding. Left the unit in stable condition. 

## 2015-05-19 NOTE — H&P (Signed)
H&P  Chief Complaint: Right sided proximal stone  History of Present Illness: Shannon Dawson is a 40 y.o. year old with h/o nephrolithiasis came in with several days of worsening right sided abdominal pain with n/v. The patient started developing fevers and went to the ED. She had a CT scan that showed a 7 mm right proximal ureteral stone.  Past Medical History  Diagnosis Date  . Anxiety   . Mental disorder   . Depression   . Headache(784.0)   . Family history of anesthesia complication   . Ovarian cyst     Past Surgical History  Procedure Laterality Date  . Cholecystectomy  2010    Home Medications:    Medication List    ASK your doctor about these medications        cloNIDine 0.1 MG tablet  Commonly known as:  CATAPRES  Take 1 tablet (0.1 mg total) by mouth 3 (three) times daily. For anxiety     gabapentin 300 MG capsule  Commonly known as:  NEURONTIN  Take 1 capsule (300 mg total) by mouth 3 (three) times daily. For anxiety and management of pain.  May be helpful for sleep.     ibuprofen 200 MG tablet  Commonly known as:  ADVIL,MOTRIN  Take 1-4 tablets (200-800 mg total) by mouth every 6 (six) hours as needed (for pain). For pain     phenylephrine-shark liver oil-mineral oil-petrolatum 0.25-3-14-71.9 % rectal ointment  Commonly known as:  PREPARATION H  Place 1 application rectally 2 (two) times daily as needed. Apply rectally as needed: for hemorroids     QUEtiapine 200 MG tablet  Commonly known as:  SEROQUEL  Take 1 tablet (200 mg total) by mouth 3 (three) times daily. For mood control     traZODone 100 MG tablet  Commonly known as:  DESYREL  Take 1 tablet (100 mg total) by mouth at bedtime as needed for sleep (for sleep ). For sleep     valproic acid 250 MG capsule  Commonly known as:  DEPAKENE  Take 1 capsule (250 mg total) by mouth 2 (two) times daily. For mood stabilization        Allergies:  Allergies  Allergen Reactions  . Tramadol Itching   Severe  . Vicodin [Hydrocodone-Acetaminophen] Itching    Itching is severe.     History reviewed. No pertinent family history.  Social History:  reports that she has been smoking Cigarettes.  She has a 27 pack-year smoking history. She does not have any smokeless tobacco history on file. She reports that she drinks alcohol. She reports that she uses illicit drugs (Heroin) about 7 times per week.  ROS: A complete review of systems was performed.  All systems are negative except for pertinent findings as noted.  Physical Exam:  Vital signs in last 24 hours: Temp:  [98.8 F (37.1 C)-102.1 F (38.9 C)] 98.8 F (37.1 C) (03/03 0050) Pulse Rate:  [86-140] 91 (03/02 2330) Resp:  [15-24] 18 (03/03 0050) BP: (93-122)/(65-95) 93/65 mmHg (03/03 0050) SpO2:  [94 %-98 %] 98 % (03/03 0050) Weight:  [68.04 kg (150 lb)] 68.04 kg (150 lb) (03/02 1925) Constitutional:  Alert and oriented, No acute distress Cardiovascular: Regular rate and rhythm, No JVD Respiratory: Normal respiratory effort, Lungs clear bilaterally GI: Abdomen is soft, mildly tender, nondistended, no abdominal masses GU: No CVA tenderness Lymphatic: No lymphadenopathy Neurologic: Grossly intact, no focal deficits Psychiatric: Normal mood and affect  Laboratory Data:   Recent Labs  05/18/15  1940  WBC 7.3  HGB 12.0  HCT 36.2  PLT 248     Recent Labs  05/18/15 1940  NA 137  K 3.4*  CL 101  GLUCOSE 151*  BUN 9  CALCIUM 9.5  CREATININE 1.15*     Results for orders placed or performed during the hospital encounter of 05/18/15 (from the past 24 hour(s))  I-Stat beta hCG blood, ED ([facility], [facility], [facility] only)     Status: None   Collection Time: 05/18/15  7:39 PM  Result Value Ref Range   I-stat hCG, quantitative <5.0 <5 mIU/mL   Comment 3          Lipase, blood     Status: None   Collection Time: 05/18/15  7:40 PM  Result Value Ref Range   Lipase 16 11 - 51 U/L  Comprehensive metabolic panel     Status: Abnormal    Collection Time: 05/18/15  7:40 PM  Result Value Ref Range   Sodium 137 135 - 145 mmol/L   Potassium 3.4 (L) 3.5 - 5.1 mmol/L   Chloride 101 101 - 111 mmol/L   CO2 20 (L) 22 - 32 mmol/L   Glucose, Bld 151 (H) 65 - 99 mg/dL   BUN 9 6 - 20 mg/dL   Creatinine, Ser 1.61 (H) 0.44 - 1.00 mg/dL   Calcium 9.5 8.9 - 09.6 mg/dL   Total Protein 7.1 6.5 - 8.1 g/dL   Albumin 2.9 (L) 3.5 - 5.0 g/dL   AST 17 15 - 41 U/L   ALT 11 (L) 14 - 54 U/L   Alkaline Phosphatase 73 38 - 126 U/L   Total Bilirubin 0.3 0.3 - 1.2 mg/dL   GFR calc non Af Amer 59 (L) >60 mL/min   GFR calc Af Amer >60 >60 mL/min   Anion gap 16 (H) 5 - 15  CBC     Status: None   Collection Time: 05/18/15  7:40 PM  Result Value Ref Range   WBC 7.3 4.0 - 10.5 K/uL   RBC 3.93 3.87 - 5.11 MIL/uL   Hemoglobin 12.0 12.0 - 15.0 g/dL   HCT 04.5 40.9 - 81.1 %   MCV 92.1 78.0 - 100.0 fL   MCH 30.5 26.0 - 34.0 pg   MCHC 33.1 30.0 - 36.0 g/dL   RDW 91.4 78.2 - 95.6 %   Platelets 248 150 - 400 K/uL  I-Stat CG4 Lactic Acid, ED     Status: None   Collection Time: 05/18/15  7:42 PM  Result Value Ref Range   Lactic Acid, Venous 1.98 0.5 - 2.0 mmol/L  Urinalysis, Routine w reflex microscopic (not at [facility])     Status: Abnormal   Collection Time: 05/18/15  7:43 PM  Result Value Ref Range   Color, Urine AMBER (A) YELLOW   APPearance CLOUDY (A) CLEAR   Specific Gravity, Urine 1.018 1.005 - 1.030   pH 6.0 5.0 - 8.0   Glucose, UA NEGATIVE NEGATIVE mg/dL   Hgb urine dipstick LARGE (A) NEGATIVE   Bilirubin Urine NEGATIVE NEGATIVE   Ketones, ur NEGATIVE NEGATIVE mg/dL   Protein, ur 213 (A) NEGATIVE mg/dL   Nitrite NEGATIVE NEGATIVE   Leukocytes, UA LARGE (A) NEGATIVE  Urine microscopic-add on     Status: Abnormal   Collection Time: 05/18/15  7:43 PM  Result Value Ref Range   Squamous Epithelial / LPF 6-30 (A) NONE SEEN   WBC, UA TOO NUMEROUS TO COUNT 0 - 5 WBC/hpf   RBC /  HPF TOO NUMEROUS TO COUNT 0 - 5 RBC/hpf   Bacteria, UA MANY (A) NONE  SEEN   Urine-Other YEAST PRESENT   Urinalysis, Routine w reflex microscopic (not at [facility])     Status: Abnormal   Collection Time: 05/19/15  1:45 AM  Result Value Ref Range   Color, Urine YELLOW YELLOW   APPearance CLOUDY (A) CLEAR   Specific Gravity, Urine 1.041 (H) 1.005 - 1.030   pH 6.5 5.0 - 8.0   Glucose, UA NEGATIVE NEGATIVE mg/dL   Hgb urine dipstick MODERATE (A) NEGATIVE   Bilirubin Urine NEGATIVE NEGATIVE   Ketones, ur NEGATIVE NEGATIVE mg/dL   Protein, ur NEGATIVE NEGATIVE mg/dL   Nitrite NEGATIVE NEGATIVE   Leukocytes, UA MODERATE (A) NEGATIVE  Urine microscopic-add on     Status: Abnormal   Collection Time: 05/19/15  1:45 AM  Result Value Ref Range   Squamous Epithelial / LPF 0-5 (A) NONE SEEN   WBC, UA TOO NUMEROUS TO COUNT 0 - 5 WBC/hpf   RBC / HPF TOO NUMEROUS TO COUNT 0 - 5 RBC/hpf   Bacteria, UA MANY (A) NONE SEEN   Urine-Other YEAST PRESENT    No results found for this or any previous visit (from the past 240 hour(s)).  Renal Function:  Recent Labs  05/18/15 1940  CREATININE 1.15*   Estimated Creatinine Clearance: 61.6 mL/min (by C-G formula based on Cr of 1.15).  Radiologic Imaging: Ct Abdomen Pelvis W Contrast  05/18/2015  CLINICAL DATA:  Acute onset right lower quadrant abdominal pain, radiating to the back. Fever and chills. Initial encounter. EXAM: CT ABDOMEN AND PELVIS WITH CONTRAST TECHNIQUE: Multidetector CT imaging of the abdomen and pelvis was performed using the standard protocol following bolus administration of intravenous contrast. CONTRAST:  OMNIPAQUE IOHEXOL 300 MG/ML  SOLN COMPARISON:  CT of the chest, abdomen and pelvis performed 07/18/2009 FINDINGS: Mild bibasilar atelectasis is noted. Trace pericardial fluid remains within normal limits. The liver and spleen are unremarkable in appearance. The patient is status post cholecystectomy, with clips noted at the gallbladder fossa. The pancreas and adrenal glands are unremarkable. There is  mild-to-moderate right-sided hydronephrosis, with an obstructing 7 x 7 mm stone noted at the proximal right ureter, just below the right renal pelvis. A nonobstructing 7 mm stone is noted at the lower pole of the right kidney. Additional smaller nonobstructing left renal stones are seen. There is a somewhat complex mildly hypoattenuating mass at the interpole region of the right kidney, measuring 2.3 cm. This is difficult to fully assess, due to poor enhancement of the right kidney secondary to the patient's hydronephrosis. No free fluid is identified. The small bowel is unremarkable in appearance. The stomach is within normal limits. No acute vascular abnormalities are seen. Minimal calcification is noted at the distal abdominal aorta. A retroaortic left renal vein is noted. The appendix is normal in caliber, without evidence of appendicitis. The colon is unremarkable in appearance. The bladder is mildly distended and grossly unremarkable. A large right adnexal cystic lesion is noted, measuring 10.6 x 9.6 x 8.7 cm. The left ovary is unremarkable in appearance. The uterus is grossly unremarkable, though the cervix is difficult to fully assess. No inguinal lymphadenopathy is seen. No acute osseous abnormalities are identified. IMPRESSION: 1. Mild to moderate right-sided hydronephrosis, with an obstructing 7 x 7 mm stone at the proximal right ureter, just below the right renal pelvis. 2. Somewhat complex mildly hypoattenuating mass at the interpole region of the right kidney, measuring  2.3 cm. This is difficult to fully assess, due to poor enhancement of the right kidney secondary to the patient's hydronephrosis. MRI of the abdomen is recommended for further evaluation, to exclude malignancy; ultrasound could be considered, but may not provide sufficient confirmation in this case. 3. Large right adnexal cystic lesion, measuring 10.6 x 9.6 x 8.7 cm. This could be further assessed on pelvic ultrasound, or if the patient  is undergoing abdominal MRI, concurrent pelvic MRI could be considered. 4. Nonobstructing bilateral renal stones, measuring up to 7 mm in size. 5. Mild bibasilar atelectasis noted. Electronically Signed   By: Roanna Raider M.D.   On: 05/18/2015 22:53    Impression/Assessment:  40 y.o. female with 7 mm right proximal ureteral stone with evidence of infection.  Plan:  - Emergent right sided stent. R/B discussed - Admit to urology post op  Juliane Poot 05/19/2015, 2:24 AM

## 2015-05-19 NOTE — Transfer of Care (Signed)
Immediate Anesthesia Transfer of Care Note  Patient: Shannon SnareMary Dawson  Procedure(s) Performed: Procedure(s): CYSTOSCOPY WITH RETROGRADE PYELOGRAM, URETEROSCOPY AND STENT PLACEMENT (Right)  Patient Location: PACU  Anesthesia Type:General  Level of Consciousness: awake  Airway & Oxygen Therapy: Patient Spontanous Breathing and Patient connected to face mask oxygen  Post-op Assessment: Report given to RN and Post -op Vital signs reviewed and stable  Post vital signs: Reviewed and stable  Last Vitals:  Filed Vitals:   05/18/15 2330 05/19/15 0050  BP: 107/79 93/65  Pulse: 91   Temp:  37.1 C  Resp: 20 18    Complications: No apparent anesthesia complications

## 2015-05-19 NOTE — ED Provider Notes (Signed)
2:00 AM  Pt here with concern for infected R obstructing ureteral stone.  [facility] Dr. Chilton SiGreen with Urology.  Pt had fever in ED and tachycardia.  WBC and lactic normal.  [facility]  Pain continues, will give IV Dilaudid.  Will keep NPO and hydrate.  Awaiting Urology dispo.  2:30 AM  Pt to go to OR.  Layla MawKristen N Darly Massi, DO 05/19/15 [phone]

## 2015-05-19 NOTE — Discharge Summary (Signed)
Date of admission: 05/18/2015  Date of discharge: 05/19/2015  Admission diagnosis: Infected kidney stone  Discharge diagnosis: Same  Secondary diagnoses: None  History and Physical: For full details, please see admission history and physical. Briefly, Shannon Dawson is a 40 y.o. year old patient with 7 mm right proximal stone with evidence of infection.   Hospital Course: The patient came into the ED on 05/18/15 with evidence of an infected kidney stone. A stent was placed overnight. The patient defervesced and remained afebrile. She wanted to go home and was discharged with pain medication and cipro antibiotics for 10 days.  Laboratory values:  Recent Labs  05/18/15 1940 05/19/15 0546  HGB 12.0 10.4*  HCT 36.2 31.9*    Recent Labs  05/18/15 1940 05/19/15 0546  CREATININE 1.15* 0.95    Disposition: Home  Discharge instruction: The patient was instructed to be ambulatory but told to refrain from heavy lifting, strenuous activity, or driving.   Discharge medications:    Medication List    STOP taking these medications        cloNIDine 0.1 MG tablet  Commonly known as:  CATAPRES     gabapentin 300 MG capsule  Commonly known as:  NEURONTIN     ibuprofen 200 MG tablet  Commonly known as:  ADVIL,MOTRIN     phenylephrine-shark liver oil-mineral oil-petrolatum 0.25-3-14-71.9 % rectal ointment  Commonly known as:  PREPARATION H     QUEtiapine 200 MG tablet  Commonly known as:  SEROQUEL     traZODone 100 MG tablet  Commonly known as:  DESYREL     valproic acid 250 MG capsule  Commonly known as:  DEPAKENE      TAKE these medications        ciprofloxacin 500 MG tablet  Commonly known as:  CIPRO  Take 1 tablet (500 mg total) by mouth 2 (two) times daily.     HYDROcodone-acetaminophen 5-325 MG tablet  Commonly known as:  NORCO/VICODIN  Take 1-2 tablets by mouth every 4 (four) hours as needed for moderate pain.        Followup: for lithotripsy and stent exchange

## 2015-05-19 NOTE — Op Note (Signed)
.  Preoperative diagnosis: right UPJ stone, sepsis  Postoperative diagnosis: Same  Procedure: 1 cystoscopy 2. right retrograde pyelography 3.  Intraoperative fluoroscopy, under one hour, with interpretation 4. right 6 x 26 JJ stent placement  Attending: Wilkie AyePatrick Rochella Benner  Assistant: Greggory BrandyPeter Greene MD  Anesthesia: General  Estimated blood loss: None  Drains: Right 6 x 24 JJ ureteral stent without tether, 16 French foley catheter  Specimens: none  Antibiotics: rocephin and fluconazole  Findings: right UPJ stone. Moderate hydronephrosis. No masses/lesions in the bladder. Ureteral orifices in normal anatomic location.  Indications: Patient is a 40 year old female with a history of left renal stone and concern for sepsis.  After discussing treatment options, they decided proceed with right stent placement.  Procedure her in detail: The patient was brought to the operating room and a brief timeout was done to ensure correct patient, correct procedure, correct site.  General anesthesia was administered patient was placed in dorsal lithotomy position.  Their genitalia was then prepped and draped in usual sterile fashion.  A rigid 22 French cystoscope was passed in the urethra and the bladder.  Bladder was inspected free masses or lesions.  the ureteral orifices were in the normal orthotopic locations.  a 6 french ureteral catheter was then instilled into the right ureteral orifice.  a gentle retrograde was obtained and findings noted above.  we then placed a zip wire through the ureteral catheter and advanced up to the renal pelvis.    We then placed a 6 x 24 double-j ureteral stent over the original zip wire.  We then removed the wire and good coil was noted in the the renal pelvis under fluoroscopy and the bladder under direct vision.  A foley catheter was then placed. the bladder was then drained and this concluded the procedure which was well tolerated by patient.  Complications:  None  Condition: Stable, extubated, transferred to PACU  Plan: Patient is to be admitted for IV antibiotics. Her foley will be removed in the morning if she remains afebrile. She will then be discharged home after 24 hours with no fever. She will have his stone extraction in 2 weeks.

## 2015-05-19 NOTE — ED Notes (Signed)
Pt departing [facility] ED with [facility] on stretcher

## 2015-05-19 NOTE — ED Provider Notes (Signed)
The patient appears reasonably stabilized for transfer considering the current resources, flow, and capabilities available in the ED at this time, and I doubt any other [facility] requiring further screening and/or treatment in the ED prior to transfer.  BP 93/65 mmHg  Pulse 91  Temp(Src) 98.8 F (37.1 C) (Oral)  Resp 18  Ht 5\' 4"  (1.626 m)  Wt 68.04 kg  BMI 25.73 kg/m2  SpO2 98%  LMP 05/09/2015   Zadie Rhineonald Bea Duren, MD 05/19/15 [id number]

## 2015-05-19 NOTE — Anesthesia Procedure Notes (Signed)
Procedure Name: Intubation Date/Time: 05/19/2015 3:07 AM Performed by: Leroy LibmanEARDON, Yonatan Guitron L Patient Re-evaluated:Patient Re-evaluated prior to inductionOxygen Delivery Method: Circle system utilized Preoxygenation: Pre-oxygenation with 100% oxygen Intubation Type: IV induction Ventilation: Mask ventilation without difficulty and Oral airway inserted - appropriate to patient size Laryngoscope Size: Hyacinth MeekerMiller and 2 Grade View: Grade I Tube type: Oral Tube size: 7.5 mm Number of attempts: 1 Airway Equipment and Method: Stylet Placement Confirmation: ETT inserted through vocal cords under direct vision,  positive ETCO2 and breath sounds checked- equal and bilateral Secured at: 21 cm Tube secured with: Tape Dental Injury: Teeth and Oropharynx as per pre-operative assessment

## 2015-05-19 NOTE — ED Notes (Signed)
Urologist called. He stated that he was "working on getting things rolling to have pt moved up to OR holding."

## 2015-05-19 NOTE — Progress Notes (Addendum)
CSW is unable to assist with finding PCP. Please consult RNCM for assistance. Please contact financial counselor [phone]( [phone] ) for assistance with insurance issues.  Cori RazorJamie Freya Zobrist LCSW [phone]

## 2015-05-19 NOTE — Care Management Note (Signed)
Case Management Note  Patient Details  Name: Shannon Dawson MRN: [id number] Date of Birth: December 25, 1975  Subjective/Objective:            pyelonephritis and obstructing stone        Action/Plan:Date:  May 19, 2015 Chart reviewed for concurrent status and case management needs. Will continue to follow patient for changes and needs: Shannon Dawson, BSN, RN, [place]   [phone]   Expected Discharge Date:                  Expected Discharge Plan:  Home/Self Care  In-House Referral:  NA  Discharge planning Services  CM Consult  Post Acute Care Choice:  NA Choice offered to:  NA  DME Arranged:    DME Agency:     HH Arranged:    HH Agency:     Status of Service:  In process, will continue to follow  Medicare Important Message Given:    Date Medicare IM Given:    Medicare IM give by:    Date Additional Medicare IM Given:    Additional Medicare Important Message give by:     If discussed at Long Length of Stay Meetings, dates discussed:    Additional Comments:  Golda AcreDavis, Shannon Leiner Lynn, RN 05/19/2015, 10:59 AM

## 2015-05-19 NOTE — ED Notes (Signed)
OR called and requested pt be brought over stat

## 2015-05-19 NOTE — Anesthesia Preprocedure Evaluation (Addendum)
Anesthesia Evaluation  Patient identified by MRN, date of birth, ID band Patient awake    Reviewed: Allergy & Precautions, NPO status , Patient's Chart, lab work & pertinent test results  Airway Mallampati: II  TM Distance: >3 FB Neck ROM: Full    Dental no notable dental hx.    Pulmonary Current Smoker,    Pulmonary exam normal breath sounds clear to auscultation       Cardiovascular negative cardio ROS Normal cardiovascular exam Rhythm:Regular Rate:Normal     Neuro/Psych negative neurological ROS  negative psych ROS   GI/Hepatic negative GI ROS, (+)     substance abuse  IV drug use,   Endo/Other  negative endocrine ROS  Renal/GU negative Renal ROS  negative genitourinary   Musculoskeletal negative musculoskeletal ROS (+)   Abdominal   Peds negative pediatric ROS (+)  Hematology negative hematology ROS (+)   Anesthesia Other Findings   Reproductive/Obstetrics negative OB ROS                           Anesthesia Physical Anesthesia Plan  ASA: III and emergent  Anesthesia Plan: General   Post-op Pain Management:    Induction: Intravenous  Airway Management Planned: Oral ETT  Additional Equipment:   Intra-op Plan:   Post-operative Plan: Extubation in OR  Informed Consent: I have reviewed the patients History and Physical, chart, labs and discussed the procedure including the risks, benefits and alternatives for the proposed anesthesia with the patient or authorized representative who has indicated his/her understanding and acceptance.   Dental advisory given  Plan Discussed with: CRNA and Surgeon  Anesthesia Plan Comments:        Anesthesia Quick Evaluation  

## 2015-05-20 LAB — URINE CULTURE

## 2015-05-22 ENCOUNTER — Other Ambulatory Visit: Payer: Self-pay | Admitting: Urology

## 2015-05-27 ENCOUNTER — Encounter (HOSPITAL_COMMUNITY): Payer: Self-pay | Admitting: Emergency Medicine

## 2015-05-27 ENCOUNTER — Emergency Department (HOSPITAL_COMMUNITY): Payer: MEDICAID

## 2015-05-27 ENCOUNTER — Emergency Department (HOSPITAL_COMMUNITY)
Admission: EM | Admit: 2015-05-27 | Discharge: 2015-05-27 | Disposition: A | Discharge status: Home / Self Care | Payer: Self-pay | Attending: Emergency Medicine | Admitting: Emergency Medicine

## 2015-05-27 DIAGNOSIS — F17210 Nicotine dependence, cigarettes, uncomplicated: Secondary | ICD-10-CM | POA: Insufficient documentation

## 2015-05-27 DIAGNOSIS — F419 Anxiety disorder, unspecified: Secondary | ICD-10-CM | POA: Insufficient documentation

## 2015-05-27 DIAGNOSIS — F329 Major depressive disorder, single episode, unspecified: Secondary | ICD-10-CM | POA: Insufficient documentation

## 2015-05-27 DIAGNOSIS — N200 Calculus of kidney: Secondary | ICD-10-CM | POA: Insufficient documentation

## 2015-05-27 DIAGNOSIS — Z9049 Acquired absence of other specified parts of digestive tract: Secondary | ICD-10-CM | POA: Insufficient documentation

## 2015-05-27 DIAGNOSIS — N281 Cyst of kidney, acquired: Secondary | ICD-10-CM | POA: Insufficient documentation

## 2015-05-27 DIAGNOSIS — N83201 Unspecified ovarian cyst, right side: Secondary | ICD-10-CM | POA: Insufficient documentation

## 2015-05-27 DIAGNOSIS — Z8669 Personal history of other diseases of the nervous system and sense organs: Secondary | ICD-10-CM | POA: Insufficient documentation

## 2015-05-27 LAB — BASIC METABOLIC PANEL
Anion gap: 9 (ref 5–15)
BUN: 9 mg/dL (ref 6–20)
CALCIUM: 9.2 mg/dL (ref 8.9–10.3)
CO2: 26 mmol/L (ref 22–32)
CREATININE: 0.64 mg/dL (ref 0.44–1.00)
Chloride: 107 mmol/L (ref 101–111)
GFR calc Af Amer: >60 mL/min (ref >60)
GLUCOSE: 89 mg/dL (ref 65–99)
Potassium: 4.2 mmol/L (ref 3.5–5.1)
Sodium: 142 mmol/L (ref 135–145)

## 2015-05-27 LAB — URINE MICROSCOPIC-ADD ON

## 2015-05-27 LAB — URINALYSIS, ROUTINE W REFLEX MICROSCOPIC
Bilirubin Urine: NEGATIVE
GLUCOSE, UA: NEGATIVE mg/dL
Ketones, ur: NEGATIVE mg/dL
Nitrite: NEGATIVE
PROTEIN: 30 mg/dL — AB
Specific Gravity, Urine: 1.013 (ref 1.005–1.030)
pH: 6.5 (ref 5.0–8.0)

## 2015-05-27 LAB — CBG MONITORING, ED: Glucose-Capillary: 83 mg/dL (ref 65–99)

## 2015-05-27 MED ORDER — MORPHINE SULFATE (PF) 4 MG/ML IV SOLN
4.0000 mg | INTRAVENOUS | Status: DC | PRN
  Administered 2015-05-27 (×2): 4 mg via INTRAVENOUS
  Filled 2015-05-27 (×2): qty 1

## 2015-05-27 MED ORDER — ONDANSETRON 4 MG PO TBDP
4.0000 mg | ORAL_TABLET | Freq: Once | ORAL | Status: DC | PRN
  Filled 2015-05-27: qty 1

## 2015-05-27 MED ORDER — ONDANSETRON HCL 4 MG/2ML IJ SOLN
4.0000 mg | Freq: Once | INTRAMUSCULAR | Status: AC
  Administered 2015-05-27: 4 mg via INTRAVENOUS
  Filled 2015-05-27: qty 2

## 2015-05-27 MED ORDER — SODIUM CHLORIDE 0.9 % IV BOLUS (SEPSIS)
1000.0000 mL | Freq: Once | INTRAVENOUS | Status: AC
  Administered 2015-05-27: 1000 mL via INTRAVENOUS

## 2015-05-27 MED ORDER — OXYCODONE-ACETAMINOPHEN 5-325 MG PO TABS: 2.0000 | ORAL_TABLET | ORAL | Status: DC | PRN

## 2015-05-27 MED ORDER — OXYCODONE-ACETAMINOPHEN 5-325 MG PO TABS
1.0000 | ORAL_TABLET | Freq: Once | ORAL | Status: AC
  Administered 2015-05-27: 1 via ORAL
  Filled 2015-05-27: qty 1

## 2015-05-27 NOTE — Discharge Instructions (Signed)
Follow-up with your urologist for you have planned procedure for lithotripsy. You have a cyst in your right kidney, and in your right ovary. The recommendation is an MRI to further evaluate these to ensure they are not cancerous. Follow up with your primary care physician to schedule outpatient MRI of the abdomen.  Prescription for Percocet for pain.    Kidney Stones Kidney stones (urolithiasis) are deposits that form inside your kidneys. The intense pain is caused by the stone moving through the urinary tract. When the stone moves, the ureter goes into spasm around the stone. The stone is usually passed in the urine.  CAUSES   A disorder that makes certain neck glands produce too much parathyroid hormone (primary hyperparathyroidism).  A buildup of uric acid crystals, similar to gout in your joints.  Narrowing (stricture) of the ureter.  A kidney obstruction present at birth (congenital obstruction).  Previous surgery on the kidney or ureters.  Numerous kidney infections. SYMPTOMS   Feeling sick to your stomach (nauseous).  Throwing up (vomiting).  Blood in the urine (hematuria).  Pain that usually spreads (radiates) to the groin.  Frequency or urgency of urination. DIAGNOSIS   Taking a history and physical exam.  Blood or urine tests.  CT scan.  Occasionally, an examination of the inside of the urinary bladder (cystoscopy) is performed. TREATMENT   Observation.  Increasing your fluid intake.  Extracorporeal shock wave lithotripsy--This is a noninvasive procedure that uses shock waves to break up kidney stones.  Surgery may be needed if you have severe pain or persistent obstruction. There are various surgical procedures. Most of the procedures are performed with the use of small instruments. Only small incisions are needed to accommodate these instruments, so recovery time is minimized. The size, location, and chemical composition are all important variables that  will determine the proper choice of action for you. Talk to your health care provider to better understand your situation so that you will minimize the risk of injury to yourself and your kidney.  HOME CARE INSTRUCTIONS   Drink enough water and fluids to keep your urine clear or pale yellow. This will help you to pass the stone or stone fragments.  Strain all urine through the provided strainer. Keep all particulate matter and stones for your health care provider to see. The stone causing the pain may be as small as a grain of salt. It is very important to use the strainer each and every time you pass your urine. The collection of your stone will allow your health care provider to analyze it and verify that a stone has actually passed. The stone analysis will often identify what you can do to reduce the incidence of recurrences.  Only take over-the-counter or prescription medicines for pain, discomfort, or fever as directed by your health care provider.  Keep all follow-up visits as told by your health care provider. This is important.  Get follow-up X-rays if required. The absence of pain does not always mean that the stone has passed. It may have only stopped moving. If the urine remains completely obstructed, it can cause loss of kidney function or even complete destruction of the kidney. It is your responsibility to make sure X-rays and follow-ups are completed. Ultrasounds of the kidney can show blockages and the status of the kidney. Ultrasounds are not associated with any radiation and can be performed easily in a matter of minutes.  Make changes to your daily diet as told by your health care  provider. You may be told to:  Limit the amount of salt that you eat.  Eat 5 or more servings of fruits and vegetables each day.  Limit the amount of meat, poultry, fish, and eggs that you eat.  Collect a 24-hour urine sample as told by your health care provider.You may need to collect another urine  sample every 6-12 months. SEEK MEDICAL CARE IF:  You experience pain that is progressive and unresponsive to any pain medicine you have been prescribed. SEEK IMMEDIATE MEDICAL CARE IF:   Pain cannot be controlled with the prescribed medicine.  You have a fever or shaking chills.  The severity or intensity of pain increases over 18 hours and is not relieved by pain medicine.  You develop a new onset of abdominal pain.  You feel faint or pass out.  You are unable to urinate.   This information is not intended to replace advice given to you by your health care provider. Make sure you discuss any questions you have with your health care provider.   Document Released: 03/04/2005 Document Revised: 11/23/2014 Document Reviewed: 08/05/2012 Elsevier Interactive Patient Education [employer]2016 [employer]vier Inc.

## 2015-05-27 NOTE — ED Notes (Signed)
Patient presents for right flank pain, seen 3/2 and had a splint placed. Returned because ran out of pain medication, hypertension, and patient c/o vaginal bleeding x6 days.

## 2015-05-27 NOTE — ED Provider Notes (Signed)
CSN: [id number]     Arrival date & time 05/27/15  1449 History   First MD Initiated Contact with Patient 05/27/15 1641     Chief Complaint  Patient presents with  . Nephrolithiasis      HPI  Patient presents evaluation of right flank pain. Seen and evaluated on 3-3 with UTI and7 mm proximal stone. Had a stent placed. Scheduled for repeat procedure for lithotripsy next week. Ran out of pain medicine. Has continued pain. No fever. No vomiting. Presents here.  Past Medical History  Diagnosis Date  . Anxiety   . Mental disorder   . Depression   . Headache(784.0)   . Family history of anesthesia complication   . Ovarian cyst   . Polysubstance abuse   . Insomnia    Past Surgical History  Procedure Laterality Date  . Cholecystectomy  2010  . Cystoscopy with retrograde pyelogram, ureteroscopy and stent placement Right 05/19/2015    Procedure: CYSTOSCOPY WITH RETROGRADE PYELOGRAM, URETEROSCOPY AND STENT PLACEMENT;  Surgeon: Malen GauzePatrick L McKenzie, MD;  Location: [facility];  Service: Urology;  Laterality: Right;   No family history on file. Social History  Substance Use Topics  . Smoking status: Current Every Day Smoker -- 1.50 packs/day for 18 years    Types: Cigarettes  . Smokeless tobacco: None  . Alcohol Use: Yes     Comment: Drinks beer/liquor every day   OB History    No data available     Review of Systems  Constitutional: Negative for fever, chills, diaphoresis, appetite change and fatigue.  HENT: Negative for mouth sores, sore throat and trouble swallowing.   Eyes: Negative for visual disturbance.  Respiratory: Negative for cough, chest tightness, shortness of breath and wheezing.   Cardiovascular: Negative for chest pain.  Gastrointestinal: Negative for nausea, vomiting, abdominal pain, diarrhea and abdominal distention.  Endocrine: Negative for polydipsia, polyphagia and polyuria.  Genitourinary: Positive for flank pain. Negative for dysuria, frequency and hematuria.   Musculoskeletal: Negative for gait problem.  Skin: Negative for color change, pallor and rash.  Neurological: Negative for dizziness, syncope, light-headedness and headaches.  Hematological: Does not bruise/bleed easily.  Psychiatric/Behavioral: Negative for behavioral problems and confusion.      Allergies  Tramadol and Vicodin  Home Medications   Prior to Admission medications   Medication Sig Start Date End Date Taking? Authorizing Provider  ALPRAZolam Prudy Feeler(XANAX) 1 MG tablet Take 0.5-1 mg by mouth 2 (two) times daily as needed for anxiety.   Yes Historical Provider, MD  ciprofloxacin (CIPRO) 500 MG tablet Take 1 tablet (500 mg total) by mouth 2 (two) times daily. 05/19/15  Yes Juliane PootPeter S Greene, MD  diphenhydrAMINE (BENADRYL) 25 MG tablet Take 25 mg by mouth every 6 (six) hours as needed for itching or allergies.   Yes Historical Provider, MD  HYDROcodone-acetaminophen (NORCO/VICODIN) 5-325 MG tablet Take 1-2 tablets by mouth every 4 (four) hours as needed for moderate pain. 05/19/15  Yes Juliane PootPeter S Greene, MD  ibuprofen (ADVIL,MOTRIN) 200 MG tablet Take 600-800 mg by mouth every 6 (six) hours as needed for moderate pain.    Yes Historical Provider, MD  oxyCODONE-acetaminophen (PERCOCET/ROXICET) 5-325 MG tablet Take 2 tablets by mouth every 4 (four) hours as needed. 05/27/15   Rolland PorterMark Stori Royse, MD   BP 122/91 mmHg  Pulse 81  Temp(Src) 98.1 F (36.7 C) (Oral)  Resp 14  SpO2 95%  LMP 05/09/2015 Physical Exam  Constitutional: She is oriented to person, place, and time. She appears well-developed and well-nourished.  No distress.  HENT:  Head: Normocephalic.  Eyes: Conjunctivae are normal. Pupils are equal, round, and reactive to light. No scleral icterus.  Neck: Normal range of motion. Neck supple. No thyromegaly present.  Cardiovascular: Normal rate and regular rhythm.  Exam reveals no gallop and no friction rub.   No murmur heard. Pulmonary/Chest: Effort normal and breath sounds normal. No  respiratory distress. She has no wheezes. She has no rales.  Abdominal: Soft. Bowel sounds are normal. She exhibits no distension. There is no tenderness. There is no rebound.  Musculoskeletal: Normal range of motion.  Neurological: She is alert and oriented to person, place, and time.  Skin: Skin is warm and dry. No rash noted.  Psychiatric: She has a normal mood and affect. Her behavior is normal.    ED Course  Procedures (including critical care time) Labs Review Labs Reviewed  URINALYSIS, ROUTINE W REFLEX MICROSCOPIC (NOT AT [facility]) - Abnormal; Notable for the following:    APPearance HAZY (*)    Hgb urine dipstick LARGE (*)    Protein, ur 30 (*)    Leukocytes, UA MODERATE (*)    All other components within normal limits  URINE MICROSCOPIC-ADD ON - Abnormal; Notable for the following:    Squamous Epithelial / LPF 0-5 (*)    Bacteria, UA RARE (*)    All other components within normal limits  BASIC METABOLIC PANEL  CBG MONITORING, ED    Imaging Review US Renal  05/27/2015  CLINICAL DATA:  Right flank pain. Reason ureteral stent nine days prior. EXAM: RENAL / URINARY TRACT ULTRASOUND COMPLETE COMPARISON:  CT 05/18/2015 FINDINGS: Right Kidney: Length: 10.8 cm. No hydronephrosis visualized. Within the upper pole is a 2.0 x 1.9 x 2.2 cm hypoechoic area with central echogenic focus. Nonobstructing 7 mm calculus in the lower pole. Echogenicity is otherwise within normal limits. Left Kidney: Length: 13.8 cm. Nonobstructing calculus measures 4 mm. Echogenicity within normal limits. No mass or hydronephrosis visualized. Bladder: Decompressed and not well evaluated. Right adnexal cystic mass measures 9.9 x 8.8 x 10.3 cm, with peripheral soft tissue versus adjacent ovarian tissue. There is peripheral blood flow. IMPRESSION: 1. No right hydronephrosis.  Bilateral nonobstructing renal calculi. 2. Asymmetric renal size with left kidney larger than right, this may be secondary to caliper placement, as  the kidneys appeared symmetric in size on recent CT. 3. Complex cystic lesion in the right kidney measures 2.2 cm. MRI with and without contrast characterization recommended. 4. Large right adnexal cystic mass, 10 cm greatest dimension, as seen on recent CT. Recommend management for an incidental cyst of the this size is further imaging with MRI versus surgical evaluation. This follows the recommendation from the society of radiologists in ultrasound consensus consensus statement. Electronically Signed   By: Rubye Oaks M.D.   On: 05/27/2015 18:30   I have personally reviewed and evaluated these images and lab results as part of my medical decision-making.   EKG Interpretation None      MDM   Final diagnoses:  Kidney stone  Cyst of right ovary  Renal cyst    Symptoms controlled after IV pain meds. Renal ultrasound shows resolution of her right hydronephrosis. Redemonstrated is a right renal cyst, and right ovarian cyst. I discussed the results of this with her in detail. She is recommended to have an MRI of her ovary, and kidney. States she has primary care. I written her discharge instruction for her to follow-up with him to schedule outpatient MRI of these  cystic areas to ensure there is no malignancy. She has planned procedure scheduled with urology. She s stable for discharge follow-up as above.    Rolland Porter, MD 05/27/15 [id number]

## 2015-06-01 ENCOUNTER — Emergency Department (HOSPITAL_COMMUNITY): Payer: MEDICAID

## 2015-06-01 ENCOUNTER — Encounter (HOSPITAL_COMMUNITY): Payer: Self-pay | Admitting: Emergency Medicine

## 2015-06-01 ENCOUNTER — Emergency Department (HOSPITAL_COMMUNITY)
Admission: EM | Admit: 2015-06-01 | Discharge: 2015-06-01 | Disposition: A | Discharge status: Home / Self Care | Payer: Self-pay | Attending: Emergency Medicine | Admitting: Emergency Medicine

## 2015-06-01 ENCOUNTER — Encounter (HOSPITAL_BASED_OUTPATIENT_CLINIC_OR_DEPARTMENT_OTHER): Payer: Self-pay | Admitting: *Deleted

## 2015-06-01 DIAGNOSIS — Z8659 Personal history of other mental and behavioral disorders: Secondary | ICD-10-CM | POA: Insufficient documentation

## 2015-06-01 DIAGNOSIS — Z3202 Encounter for pregnancy test, result negative: Secondary | ICD-10-CM | POA: Insufficient documentation

## 2015-06-01 DIAGNOSIS — N23 Unspecified renal colic: Secondary | ICD-10-CM | POA: Insufficient documentation

## 2015-06-01 DIAGNOSIS — R61 Generalized hyperhidrosis: Secondary | ICD-10-CM | POA: Insufficient documentation

## 2015-06-01 DIAGNOSIS — F17210 Nicotine dependence, cigarettes, uncomplicated: Secondary | ICD-10-CM | POA: Insufficient documentation

## 2015-06-01 DIAGNOSIS — Z8669 Personal history of other diseases of the nervous system and sense organs: Secondary | ICD-10-CM | POA: Insufficient documentation

## 2015-06-01 DIAGNOSIS — Z87442 Personal history of urinary calculi: Secondary | ICD-10-CM | POA: Insufficient documentation

## 2015-06-01 LAB — URINALYSIS, ROUTINE W REFLEX MICROSCOPIC
Bilirubin Urine: NEGATIVE
Glucose, UA: NEGATIVE mg/dL
Ketones, ur: NEGATIVE mg/dL
Nitrite: NEGATIVE
PROTEIN: 30 mg/dL — AB
SPECIFIC GRAVITY, URINE: 1.019 (ref 1.005–1.030)
pH: 6.5 (ref 5.0–8.0)

## 2015-06-01 LAB — COMPREHENSIVE METABOLIC PANEL
ALBUMIN: 3.8 g/dL (ref 3.5–5.0)
ALT: 23 U/L (ref 14–54)
ANION GAP: 14 (ref 5–15)
AST: 24 U/L (ref 15–41)
Alkaline Phosphatase: 69 U/L (ref 38–126)
BUN: 19 mg/dL (ref 6–20)
CALCIUM: 9.5 mg/dL (ref 8.9–10.3)
CO2: 21 mmol/L — AB (ref 22–32)
Chloride: 102 mmol/L (ref 101–111)
Creatinine, Ser: 0.87 mg/dL (ref 0.44–1.00)
GFR calc non Af Amer: >60 mL/min (ref >60)
GLUCOSE: 96 mg/dL (ref 65–99)
POTASSIUM: 4 mmol/L (ref 3.5–5.1)
SODIUM: 137 mmol/L (ref 135–145)
Total Bilirubin: 0.3 mg/dL (ref 0.3–1.2)
Total Protein: 8 g/dL (ref 6.5–8.1)

## 2015-06-01 LAB — CBC WITH DIFFERENTIAL/PLATELET
BASOS ABS: 0 10*3/uL (ref 0.0–0.1)
BASOS PCT: 0 %
EOS ABS: 0.1 10*3/uL (ref 0.0–0.7)
EOS PCT: 1 %
HCT: 39.7 % (ref 36.0–46.0)
Hemoglobin: 14 g/dL (ref 12.0–15.0)
Lymphocytes Relative: 37 %
Lymphs Abs: 2.7 10*3/uL (ref 0.7–4.0)
MCH: 31.6 pg (ref 26.0–34.0)
MCHC: 35.3 g/dL (ref 30.0–36.0)
MCV: 89.6 fL (ref 78.0–100.0)
MONO ABS: 0.5 10*3/uL (ref 0.1–1.0)
Monocytes Relative: 7 %
Neutro Abs: 4 10*3/uL (ref 1.7–7.7)
Neutrophils Relative %: 55 %
PLATELETS: 373 10*3/uL (ref 150–400)
RBC: 4.43 MIL/uL (ref 3.87–5.11)
RDW: 14.1 % (ref 11.5–15.5)
WBC: 7.3 10*3/uL (ref 4.0–10.5)

## 2015-06-01 LAB — PREGNANCY, URINE: Preg Test, Ur: NEGATIVE

## 2015-06-01 LAB — URINE MICROSCOPIC-ADD ON

## 2015-06-01 MED ORDER — CIPROFLOXACIN HCL 500 MG PO TABS
500.0000 mg | ORAL_TABLET | Freq: Once | ORAL | Status: AC
  Administered 2015-06-01: 500 mg via ORAL
  Filled 2015-06-01: qty 1

## 2015-06-01 MED ORDER — CIPROFLOXACIN HCL 500 MG PO TABS: 500.0000 mg | ORAL_TABLET | Freq: Two times a day (BID) | ORAL | Status: DC

## 2015-06-01 MED ORDER — OXYCODONE-ACETAMINOPHEN 5-325 MG PO TABS
1.0000 | ORAL_TABLET | Freq: Once | ORAL | Status: AC
  Administered 2015-06-01: 1 via ORAL
  Filled 2015-06-01: qty 1

## 2015-06-01 MED ORDER — ONDANSETRON 4 MG PO TBDP
4.0000 mg | ORAL_TABLET | Freq: Once | ORAL | Status: AC | PRN
  Administered 2015-06-01: 4 mg via ORAL
  Filled 2015-06-01: qty 1

## 2015-06-01 MED ORDER — ONDANSETRON HCL 4 MG PO TABS: 4.0000 mg | ORAL_TABLET | Freq: Four times a day (QID) | ORAL | Status: DC

## 2015-06-01 MED ORDER — DIAZEPAM 5 MG PO TABS
5.0000 mg | ORAL_TABLET | Freq: Once | ORAL | Status: AC
  Administered 2015-06-01: 5 mg via ORAL
  Filled 2015-06-01: qty 1

## 2015-06-01 MED ORDER — MORPHINE SULFATE (PF) 4 MG/ML IV SOLN
4.0000 mg | Freq: Once | INTRAVENOUS | Status: AC
  Administered 2015-06-01: 4 mg via INTRAVENOUS
  Filled 2015-06-01: qty 1

## 2015-06-01 MED ORDER — OXYCODONE-ACETAMINOPHEN 5-325 MG PO TABS
1.0000 | ORAL_TABLET | ORAL | Status: DC | PRN
Start: 2015-06-01 — End: 2015-06-12

## 2015-06-01 MED ORDER — ONDANSETRON HCL 4 MG/2ML IJ SOLN
4.0000 mg | Freq: Once | INTRAMUSCULAR | Status: AC
  Administered 2015-06-01: 4 mg via INTRAVENOUS
  Filled 2015-06-01: qty 2

## 2015-06-01 MED ORDER — KETOROLAC TROMETHAMINE 30 MG/ML IJ SOLN
30.0000 mg | Freq: Once | INTRAMUSCULAR | Status: DC
  Filled 2015-06-01: qty 1

## 2015-06-01 NOTE — ED Notes (Signed)
Pt transported to Xray. 

## 2015-06-01 NOTE — Discharge Instructions (Signed)
Kidney Stones °Kidney stones (urolithiasis) are deposits that form inside your kidneys. The intense pain is caused by the stone moving through the urinary tract. When the stone moves, the ureter goes into spasm around the stone. The stone is usually passed in the urine.  °CAUSES  °· A disorder that makes certain neck glands produce too much parathyroid hormone (primary hyperparathyroidism). °· A buildup of uric acid crystals, similar to gout in your joints. °· Narrowing (stricture) of the ureter. °· A kidney obstruction present at birth (congenital obstruction). °· Previous surgery on the kidney or ureters. °· Numerous kidney infections. °SYMPTOMS  °· Feeling sick to your stomach (nauseous). °· Throwing up (vomiting). °· Blood in the urine (hematuria). °· Pain that usually spreads (radiates) to the groin. °· Frequency or urgency of urination. °DIAGNOSIS  °· Taking a history and physical exam. °· Blood or urine tests. °· CT scan. °· Occasionally, an examination of the inside of the urinary bladder (cystoscopy) is performed. °TREATMENT  °· Observation. °· Increasing your fluid intake. °· Extracorporeal shock wave lithotripsy--This is a noninvasive procedure that uses shock waves to break up kidney stones. °· Surgery may be needed if you have severe pain or persistent obstruction. There are various surgical procedures. Most of the procedures are performed with the use of small instruments. Only small incisions are needed to accommodate these instruments, so recovery time is minimized. °The size, location, and chemical composition are all important variables that will determine the proper choice of action for you. Talk to your health care provider to better understand your situation so that you will minimize the risk of injury to yourself and your kidney.  °HOME CARE INSTRUCTIONS  °· Drink enough water and fluids to keep your urine clear or pale yellow. This will help you to pass the stone or stone fragments. °· Strain  all urine through the provided strainer. Keep all particulate matter and stones for your health care provider to see. The stone causing the pain may be as small as a grain of salt. It is very important to use the strainer each and every time you pass your urine. The collection of your stone will allow your health care provider to analyze it and verify that a stone has actually passed. The stone analysis will often identify what you can do to reduce the incidence of recurrences. °· Only take over-the-counter or prescription medicines for pain, discomfort, or fever as directed by your health care provider. °· Keep all follow-up visits as told by your health care provider. This is important. °· Get follow-up X-rays if required. The absence of pain does not always mean that the stone has passed. It may have only stopped moving. If the urine remains completely obstructed, it can cause loss of kidney function or even complete destruction of the kidney. It is your responsibility to make sure X-rays and follow-ups are completed. Ultrasounds of the kidney can show blockages and the status of the kidney. Ultrasounds are not associated with any radiation and can be performed easily in a matter of minutes. °· Make changes to your daily diet as told by your health care provider. You may be told to: °¨ Limit the amount of salt that you eat. °¨ Eat 5 or more servings of fruits and vegetables each day. °¨ Limit the amount of meat, poultry, fish, and eggs that you eat. °· Collect a 24-hour urine sample as told by your health care provider. You may need to collect another urine sample every 6-12   months. °SEEK MEDICAL CARE IF: °· You experience pain that is progressive and unresponsive to any pain medicine you have been prescribed. °SEEK IMMEDIATE MEDICAL CARE IF:  °· Pain cannot be controlled with the prescribed medicine. °· You have a fever or shaking chills. °· The severity or intensity of pain increases over 18 hours and is not  relieved by pain medicine. °· You develop a new onset of abdominal pain. °· You feel faint or pass out. °· You are unable to urinate. °  °This information is not intended to replace advice given to you by your health care provider. Make sure you discuss any questions you have with your health care provider. °  °Document Released: 03/04/2005 Document Revised: 11/23/2014 Document Reviewed: 08/05/2012 °Elsevier Interactive Patient Education ©2016 [employer] ° °Renal Colic °Renal colic is pain that is caused by passing a kidney stone. The pain can be sharp and severe. It may be felt in the back, abdomen, side (flank), or groin. It can cause nausea. Renal colic can come and go. °HOME CARE INSTRUCTIONS °Watch your condition for any changes. The following actions may help to lessen any discomfort that you are feeling: °· Take medicines only as directed by your health care provider. °· Ask your health care provider if it is okay to take over-the-counter pain medicine. °· Drink enough fluid to keep your urine clear or pale yellow. Drink 6-8 glasses of water each day. °· Limit the amount of salt that you eat to less than 2 grams per day. °· Reduce the amount of protein in your diet. Eat less meat, fish, nuts, and dairy. °· Avoid foods such as spinach, rhubarb, nuts, or bran. These may make kidney stones more likely to form. °SEEK MEDICAL CARE IF: °· You have a fever or chills. °· Your urine smells bad or looks cloudy. °· You have pain or burning when you pass urine. °SEEK IMMEDIATE MEDICAL CARE IF: °· Your flank pain or groin pain suddenly worsens. °· You become confused or disoriented or you lose consciousness. °  °This information is not intended to replace advice given to you by your health care provider. Make sure you discuss any questions you have with your health care provider. °  °Document Released: 12/12/2004 Document Revised: 03/25/2014 Document Reviewed: 01/12/2014 °Elsevier Interactive Patient Education ©2016  [employer] ° °

## 2015-06-01 NOTE — Progress Notes (Signed)
Pt instructed npo pmn 3/22 x pain med w sip of water. Pt informed no smoking p mn as well.  To [facility] 3/23 @ 1315.  Needs hgb, urine pregnancy, ? Urine drug screen. Will review w anesthesiologist.

## 2015-06-01 NOTE — ED Notes (Signed)
Patient presents for right flank pain, history of kidney stones, ureter stent placed 3/3, surgery schedule 3/23. Pain worsened yesterday evening. Ran out of pain medications last Friday, one episode of emesis yesterday.

## 2015-06-01 NOTE — ED Provider Notes (Signed)
CSN: [id number]     Arrival date & time 06/01/15  1819 History   First MD Initiated Contact with Patient 06/01/15 2023     Chief Complaint  Patient presents with  . Flank Pain     (Consider location/radiation/quality/duration/timing/severity/associated sxs/prior Treatment) HPI Patient presents with worsening right flank pain since running of her pain medication 2 days ago. She has left should see scheduled on 3/23 for a right-sided ureteral stone measuring roughly 7 mm. She had a stent placed on 3/3 for concern for infected stone. Patient states she's had nausea with several episodes of vomiting today. She's had diaphoresis. Denies dysuria or frequency. Admits to hematuria. No fever or chills. Past Medical History  Diagnosis Date  . Anxiety   . Mental disorder   . Depression   . Headache(784.0)   . Family history of anesthesia complication   . Ovarian cyst   . Polysubstance abuse     pt denies , states" that was 7 yrs ago"  . Insomnia     states due to pain  . Kidney stone   . Dysuria-frequency syndrome    Past Surgical History  Procedure Laterality Date  . Cholecystectomy  2010  . Cystoscopy with retrograde pyelogram, ureteroscopy and stent placement Right 05/19/2015    Procedure: CYSTOSCOPY WITH RETROGRADE PYELOGRAM, URETEROSCOPY AND STENT PLACEMENT;  Surgeon: Malen Gauze, MD;  Location: [facility];  Service: Urology;  Laterality: Right;  . Extracorporeal shock wave lithotripsy     No family history on file. Social History  Substance Use Topics  . Smoking status: Current Every Day Smoker -- 1.00 packs/day for 25 years    Types: Cigarettes  . Smokeless tobacco: None  . Alcohol Use: Yes     Comment: Drinks beer/liquor every day   OB History    No data available     Review of Systems  Constitutional: Positive for diaphoresis. Negative for fever and chills.  Respiratory: Negative for shortness of breath.   Cardiovascular: Negative for chest pain.  Gastrointestinal:  Positive for nausea and vomiting. Negative for abdominal pain, diarrhea and constipation.  Genitourinary: Positive for hematuria and flank pain. Negative for dysuria, frequency, vaginal bleeding, vaginal discharge and pelvic pain.  Musculoskeletal: Positive for back pain. Negative for neck pain and neck stiffness.  Skin: Negative for rash and wound.  Neurological: Negative for dizziness, weakness, light-headedness, numbness and headaches.  All other systems reviewed and are negative.     Allergies  Tramadol and Vicodin  Home Medications   Prior to Admission medications   Medication Sig Start Date End Date Taking? Authorizing Provider  ibuprofen (ADVIL,MOTRIN) 200 MG tablet Take 400 mg by mouth every 6 (six) hours as needed for moderate pain.    Yes Historical Provider, MD  ciprofloxacin (CIPRO) 500 MG tablet Take 1 tablet (500 mg total) by mouth 2 (two) times daily. 06/01/15   Loren Racer, MD  HYDROcodone-acetaminophen (NORCO/VICODIN) 5-325 MG tablet Take 1-2 tablets by mouth every 4 (four) hours as needed for moderate pain. Patient not taking: Reported on 06/01/2015 05/19/15   Juliane Poot, MD  ondansetron (ZOFRAN) 4 MG tablet Take 1 tablet (4 mg total) by mouth every 6 (six) hours. 06/01/15   Loren Racer, MD  oxyCODONE-acetaminophen (PERCOCET/ROXICET) 5-325 MG tablet Take 1-2 tablets by mouth every 4 (four) hours as needed for severe pain. 06/01/15   Loren Racer, MD   BP 126/86 mmHg  Pulse 98  Temp(Src) 98.1 F (36.7 C) (Oral)  Resp 18  SpO2  98%  LMP 05/31/2015 Physical Exam  Constitutional: She is oriented to person, place, and time. She appears well-developed and well-nourished. No distress.  HENT:  Head: Normocephalic and atraumatic.  Mouth/Throat: Oropharynx is clear and moist.  Eyes: EOM are normal. Pupils are equal, round, and reactive to light.  Neck: Normal range of motion. Neck supple.  Cardiovascular: Normal rate and regular rhythm.   Pulmonary/Chest: Effort  normal and breath sounds normal. No respiratory distress. She has no wheezes. She has no rales. She exhibits no tenderness.  Abdominal: Soft. Bowel sounds are normal. She exhibits no distension and no mass. There is no tenderness. There is no rebound and no guarding.  Musculoskeletal: Normal range of motion. She exhibits tenderness. She exhibits no edema.  Right flank tenderness with percussion.  Neurological: She is alert and oriented to person, place, and time.  Skin: Skin is warm and dry. No rash noted. No erythema.  Psychiatric: She has a normal mood and affect. Her behavior is normal.  Nursing note and vitals reviewed.   ED Course  Procedures (including critical care time) Labs Review Labs Reviewed  URINALYSIS, ROUTINE W REFLEX MICROSCOPIC (NOT AT [facility]) - Abnormal; Notable for the following:    APPearance CLOUDY (*)    Hgb urine dipstick LARGE (*)    Protein, ur 30 (*)    Leukocytes, UA LARGE (*)    All other components within normal limits  URINE MICROSCOPIC-ADD ON - Abnormal; Notable for the following:    Squamous Epithelial / LPF 6-30 (*)    Bacteria, UA FEW (*)    All other components within normal limits  COMPREHENSIVE METABOLIC PANEL - Abnormal; Notable for the following:    CO2 21 (*)    All other components within normal limits  CBC WITH DIFFERENTIAL/PLATELET  PREGNANCY, URINE    Imaging Review Dg Abd 1 View  06/01/2015  CLINICAL DATA:  Right ureteral stent. EXAM: ABDOMEN - 1 VIEW COMPARISON:  None. FINDINGS: The bowel gas pattern is normal. New right ureteral stent is identified in good position. Nonobstructing stones are identified in the right kidney, largest measuring 5 mm. Extensive bowel content is identified in the colon. Prior cholecystectomy clips are noted. IMPRESSION: Right ureteral stent in good position. Right kidney stones as described. Constipation. Electronically Signed   By: Sherian ReinWei-Chen  Lin M.D.   On: 06/01/2015 21:52   I have personally reviewed and  evaluated these images and lab results as part of my medical decision-making.   EKG Interpretation None      MDM   Final diagnoses:  Renal colic on right side    Discussed with Dr. Ronne BinningMcKenzie. Recommends KUB to assess stent placement.  Patient is well-appearing. KUB with stent in good position. Patient does have white blood cells in the urine but few bacteria. Normal white blood cell count. She is afebrile. General believe that the patient has an infected stone. She states she has finished her antibiotic. Per Dr. Thea SilversmithMackenzie will start back on antibiotics until lithotripsy performed. Return precautions given.  Loren Raceravid Eniyah Eastmond, MD 06/01/15 2227

## 2015-06-01 NOTE — ED Notes (Signed)
Delay in medication administration due to no IV access 

## 2015-06-08 ENCOUNTER — Ambulatory Visit (HOSPITAL_BASED_OUTPATIENT_CLINIC_OR_DEPARTMENT_OTHER): Admission: RE | Admit: 2015-06-08 | Payer: MEDICAID | Source: Ambulatory Visit | Admitting: Urology

## 2015-06-08 SURGERY — CYSTOSCOPY, WITH CALCULUS REMOVAL USING BASKET
Anesthesia: General | Laterality: Right

## 2015-06-09 ENCOUNTER — Encounter (HOSPITAL_BASED_OUTPATIENT_CLINIC_OR_DEPARTMENT_OTHER): Payer: Self-pay | Admitting: *Deleted

## 2015-06-09 NOTE — Progress Notes (Signed)
NPO AFTER MN WITH EXCEPTION CLEAR LIQUIDS UNTIL 0800 ( NO CREAM/ MILK PRODUCTS).  ARRIVE AT 1230.  CURRENT LAB RESULTS IN CHART AND EPIC.

## 2015-06-12 ENCOUNTER — Ambulatory Visit (HOSPITAL_BASED_OUTPATIENT_CLINIC_OR_DEPARTMENT_OTHER): Payer: Self-pay | Admitting: Anesthesiology

## 2015-06-12 ENCOUNTER — Encounter (HOSPITAL_BASED_OUTPATIENT_CLINIC_OR_DEPARTMENT_OTHER): Payer: Self-pay | Admitting: Anesthesiology

## 2015-06-12 ENCOUNTER — Ambulatory Visit (HOSPITAL_BASED_OUTPATIENT_CLINIC_OR_DEPARTMENT_OTHER)
Admission: RE | Admit: 2015-06-12 | Discharge: 2015-06-12 | Disposition: A | Discharge status: Home / Self Care | Payer: Self-pay | Source: Ambulatory Visit | Attending: Urology | Admitting: Urology

## 2015-06-12 ENCOUNTER — Encounter (HOSPITAL_BASED_OUTPATIENT_CLINIC_OR_DEPARTMENT_OTHER)
Admission: RE | Disposition: A | Discharge status: Home / Self Care | Payer: Self-pay | Source: Ambulatory Visit | Attending: Urology

## 2015-06-12 DIAGNOSIS — Z9049 Acquired absence of other specified parts of digestive tract: Secondary | ICD-10-CM | POA: Insufficient documentation

## 2015-06-12 DIAGNOSIS — F17210 Nicotine dependence, cigarettes, uncomplicated: Secondary | ICD-10-CM | POA: Insufficient documentation

## 2015-06-12 DIAGNOSIS — Z79899 Other long term (current) drug therapy: Secondary | ICD-10-CM | POA: Insufficient documentation

## 2015-06-12 DIAGNOSIS — F319 Bipolar disorder, unspecified: Secondary | ICD-10-CM | POA: Insufficient documentation

## 2015-06-12 DIAGNOSIS — Z87442 Personal history of urinary calculi: Secondary | ICD-10-CM | POA: Insufficient documentation

## 2015-06-12 DIAGNOSIS — F419 Anxiety disorder, unspecified: Secondary | ICD-10-CM | POA: Insufficient documentation

## 2015-06-12 DIAGNOSIS — N200 Calculus of kidney: Secondary | ICD-10-CM | POA: Insufficient documentation

## 2015-06-12 HISTORY — DX: Calculus of kidney: N20.0

## 2015-06-12 HISTORY — DX: Alcohol abuse, in remission: F10.11

## 2015-06-12 HISTORY — DX: Cyst of kidney, acquired: N28.1

## 2015-06-12 HISTORY — DX: Personal history of urinary calculi: Z87.442

## 2015-06-12 HISTORY — PX: HOLMIUM LASER APPLICATION: SHX5852

## 2015-06-12 HISTORY — DX: Irregular menstruation, unspecified: N92.6

## 2015-06-12 HISTORY — DX: Obsessive-compulsive disorder, unspecified: F42.9

## 2015-06-12 HISTORY — PX: CYSTOSCOPY WITH RETROGRADE PYELOGRAM, URETEROSCOPY AND STENT PLACEMENT: SHX5789

## 2015-06-12 HISTORY — DX: Generalized anxiety disorder: F41.1

## 2015-06-12 HISTORY — DX: Opioid abuse, in remission: F11.11

## 2015-06-12 HISTORY — DX: Calculus of ureter: N20.1

## 2015-06-12 HISTORY — DX: Pain in right knee: M25.561

## 2015-06-12 HISTORY — DX: Other chronic pain: G89.29

## 2015-06-12 SURGERY — CYSTOURETEROSCOPY, WITH RETROGRADE PYELOGRAM AND STENT INSERTION
Anesthesia: General | Laterality: Right

## 2015-06-12 MED ORDER — DEXAMETHASONE SODIUM PHOSPHATE 4 MG/ML IJ SOLN
INTRAMUSCULAR | Status: DC | PRN
  Administered 2015-06-12: 10 mg via INTRAVENOUS

## 2015-06-12 MED ORDER — KETOROLAC TROMETHAMINE 30 MG/ML IJ SOLN
INTRAMUSCULAR | Status: DC | PRN
  Administered 2015-06-12: 30 mg via INTRAVENOUS

## 2015-06-12 MED ORDER — OXYCODONE HCL 5 MG/5ML PO SOLN
5.0000 mg | Freq: Once | ORAL | Status: AC | PRN
Start: 2015-06-12 — End: 2015-06-12
  Filled 2015-06-12: qty 5

## 2015-06-12 MED ORDER — PROPOFOL 10 MG/ML IV BOLUS
INTRAVENOUS | Status: AC
  Filled 2015-06-12: qty 20

## 2015-06-12 MED ORDER — LIDOCAINE HCL (CARDIAC) 20 MG/ML IV SOLN
INTRAVENOUS | Status: AC
  Filled 2015-06-12: qty 5

## 2015-06-12 MED ORDER — DEXTROSE 5 % IV SOLN
INTRAVENOUS | Status: AC
Start: 2015-06-12 — End: 2015-06-12
  Filled 2015-06-12: qty 50

## 2015-06-12 MED ORDER — ONDANSETRON HCL 4 MG/2ML IJ SOLN
INTRAMUSCULAR | Status: DC | PRN
  Administered 2015-06-12: 4 mg via INTRAVENOUS

## 2015-06-12 MED ORDER — SULFAMETHOXAZOLE-TRIMETHOPRIM 800-160 MG PO TABS: 1.0000 | ORAL_TABLET | Freq: Two times a day (BID) | ORAL | Status: DC

## 2015-06-12 MED ORDER — DEXTROSE 5 % IV SOLN
2.0000 g | INTRAVENOUS | Status: AC
  Administered 2015-06-12: 2 g via INTRAVENOUS
  Filled 2015-06-12: qty 2

## 2015-06-12 MED ORDER — ACETAMINOPHEN 325 MG PO TABS
325.0000 mg | ORAL_TABLET | ORAL | Status: DC | PRN
  Filled 2015-06-12: qty 2

## 2015-06-12 MED ORDER — OXYCODONE-ACETAMINOPHEN 10-325 MG PO TABS: 1.0000 | ORAL_TABLET | ORAL | Status: DC | PRN

## 2015-06-12 MED ORDER — MIDAZOLAM HCL 2 MG/2ML IJ SOLN
INTRAMUSCULAR | Status: AC
  Filled 2015-06-12: qty 2

## 2015-06-12 MED ORDER — FENTANYL CITRATE (PF) 100 MCG/2ML IJ SOLN
25.0000 ug | INTRAMUSCULAR | Status: DC | PRN
  Filled 2015-06-12: qty 1

## 2015-06-12 MED ORDER — FENTANYL CITRATE (PF) 100 MCG/2ML IJ SOLN
INTRAMUSCULAR | Status: AC
Start: 2015-06-12 — End: 2015-06-12
  Filled 2015-06-12: qty 2

## 2015-06-12 MED ORDER — ONDANSETRON HCL 4 MG/2ML IJ SOLN
INTRAMUSCULAR | Status: AC
  Filled 2015-06-12: qty 2

## 2015-06-12 MED ORDER — FENTANYL CITRATE (PF) 100 MCG/2ML IJ SOLN
INTRAMUSCULAR | Status: DC | PRN
  Administered 2015-06-12: 25 ug via INTRAVENOUS
  Administered 2015-06-12: 50 ug via INTRAVENOUS
  Administered 2015-06-12: 25 ug via INTRAVENOUS

## 2015-06-12 MED ORDER — MIDAZOLAM HCL 5 MG/5ML IJ SOLN: INTRAMUSCULAR | Status: DC | PRN

## 2015-06-12 MED ORDER — CEFTRIAXONE SODIUM 2 G IJ SOLR
INTRAMUSCULAR | Status: AC
  Filled 2015-06-12: qty 2

## 2015-06-12 MED ORDER — OXYCODONE HCL 5 MG PO TABS
ORAL_TABLET | ORAL | Status: AC
  Filled 2015-06-12: qty 1

## 2015-06-12 MED ORDER — ACETAMINOPHEN 160 MG/5ML PO SOLN
325.0000 mg | ORAL | Status: DC | PRN
  Filled 2015-06-12: qty 20.3

## 2015-06-12 MED ORDER — OXYCODONE-ACETAMINOPHEN 5-325 MG PO TABS: 1.0000 | ORAL_TABLET | ORAL | Status: DC | PRN

## 2015-06-12 MED ORDER — DEXAMETHASONE SODIUM PHOSPHATE 10 MG/ML IJ SOLN
INTRAMUSCULAR | Status: AC
  Filled 2015-06-12: qty 1

## 2015-06-12 MED ORDER — PROPOFOL 10 MG/ML IV BOLUS
INTRAVENOUS | Status: DC | PRN
  Administered 2015-06-12: 20 mg via INTRAVENOUS
  Administered 2015-06-12: 30 mg via INTRAVENOUS

## 2015-06-12 MED ORDER — KETOROLAC TROMETHAMINE 30 MG/ML IJ SOLN
INTRAMUSCULAR | Status: AC
  Filled 2015-06-12: qty 1

## 2015-06-12 MED ORDER — LACTATED RINGERS IV SOLN
INTRAVENOUS | Status: DC
  Administered 2015-06-12: 14:00:00 via INTRAVENOUS
  Filled 2015-06-12: qty 1000

## 2015-06-12 MED ORDER — OXYCODONE HCL 5 MG PO TABS
5.0000 mg | ORAL_TABLET | Freq: Once | ORAL | Status: AC | PRN
  Administered 2015-06-12: 5 mg via ORAL
  Filled 2015-06-12: qty 1

## 2015-06-12 MED ORDER — MIDAZOLAM HCL 5 MG/5ML IJ SOLN
INTRAMUSCULAR | Status: DC | PRN
  Administered 2015-06-12: 2 mg via INTRAVENOUS

## 2015-06-12 SURGICAL SUPPLY — 31 items
BAG DRAIN URO-CYSTO SKYTR STRL (DRAIN) ×3 IMPLANT
BAG DRN UROCATH (DRAIN) ×1
BASKET DAKOTA 1.9FR 11X120 (BASKET) IMPLANT
BASKET LASER NITINOL 1.9FR (BASKET) IMPLANT
BASKET STONE 1.7 NGAGE (UROLOGICAL SUPPLIES) ×2 IMPLANT
BASKET ZERO TIP NITINOL 2.4FR (BASKET) IMPLANT
BSKT STON RTRVL 120 1.9FR (BASKET)
BSKT STON RTRVL ZERO TP 2.4FR (BASKET)
CANISTER SUCT LVC 12 LTR MEDI- (MISCELLANEOUS) IMPLANT
CATH INTERMIT  6FR 70CM (CATHETERS) ×3 IMPLANT
CLOTH BEACON ORANGE TIMEOUT ST (SAFETY) ×3 IMPLANT
FIBER LASER FLEXIVA 200 (UROLOGICAL SUPPLIES) ×2 IMPLANT
FIBER LASER FLEXIVA 365 (UROLOGICAL SUPPLIES) IMPLANT
FIBER LASER TRAC TIP (UROLOGICAL SUPPLIES) IMPLANT
GLOVE BIO SURGEON STRL SZ8 (GLOVE) ×3 IMPLANT
GOWN STRL REUS W/ TWL LRG LVL3 (GOWN DISPOSABLE) ×1 IMPLANT
GOWN STRL REUS W/ TWL XL LVL3 (GOWN DISPOSABLE) ×1 IMPLANT
GOWN STRL REUS W/TWL LRG LVL3 (GOWN DISPOSABLE) ×3
GOWN STRL REUS W/TWL XL LVL3 (GOWN DISPOSABLE) ×3
GUIDEWIRE ANG ZIPWIRE 038X150 (WIRE) ×3 IMPLANT
GUIDEWIRE STR DUAL SENSOR (WIRE) IMPLANT
IV NS IRRIG 3000ML ARTHROMATIC (IV SOLUTION) ×3 IMPLANT
KIT ROOM TURNOVER WOR (KITS) ×3 IMPLANT
MANIFOLD NEPTUNE II (INSTRUMENTS) ×2 IMPLANT
PACK CYSTO (CUSTOM PROCEDURE TRAY) ×3 IMPLANT
SHEATH ACCESS URETERAL 38CM (SHEATH) ×2 IMPLANT
STENT URET 6FRX26 CONTOUR (STENTS) ×2 IMPLANT
SYRINGE 10CC LL (SYRINGE) ×3 IMPLANT
TUBE CONNECTING 12'X1/4 (SUCTIONS)
TUBE CONNECTING 12X1/4 (SUCTIONS) IMPLANT
TUBE FEEDING 8FR 16IN STR KANG (MISCELLANEOUS) IMPLANT

## 2015-06-12 NOTE — Anesthesia Procedure Notes (Signed)
Procedure Name: LMA Insertion Date/Time: 06/12/2015 2:00 PM Performed by: Norva PavlovALLAWAY, Shannon Luddy G Pre-anesthesia Checklist: Patient identified, Emergency Drugs available, Suction available and Patient being monitored Patient Re-evaluated:Patient Re-evaluated prior to inductionOxygen Delivery Method: Circle System Utilized Intubation Type: Inhalational induction Ventilation: Mask ventilation without difficulty and Oral airway inserted - appropriate to patient size LMA: LMA inserted LMA Size: 4.0 Number of attempts: 1 Placement Confirmation: positive ETCO2,  breath sounds checked- equal and bilateral and CO2 detector Tube secured with: Tape Dental Injury: Teeth and Oropharynx as per pre-operative assessment

## 2015-06-12 NOTE — Transfer of Care (Signed)
Last Vitals:  Filed Vitals:   06/12/15 1245  BP: 123/73  Pulse: 82  Temp: 36.9 C  Resp: 16    Immediate Anesthesia Transfer of Care Note  Patient: Shannon Dawson  Procedure(s) Performed: Procedure(s) (LRB): CYSTOSCOPY WITH RETROGRADE PYELOGRAM, URETEROSCOPY, STONE EXTRACTION AND STENT EXCHANGE (Right) HOLMIUM LASER APPLICATION (Right)  Patient Location: PACU  Anesthesia Type: General  Level of Consciousness: awake, alert  and oriented  Airway & Oxygen Therapy: Patient Spontanous Breathing and Patient connected to nasal cannula oxygen  Post-op Assessment: Report given to PACU RN and Post -op Vital signs reviewed and stable  Post vital signs: Reviewed and stable  Complications: No apparent anesthesia complications

## 2015-06-12 NOTE — Discharge Instructions (Signed)

## 2015-06-12 NOTE — Brief Op Note (Signed)
06/12/2015  2:36 PM  PATIENT:  Shannon Dawson  40 y.o. female  PRE-OPERATIVE DIAGNOSIS:  RIGHT URETERAL CALCULUS  POST-OPERATIVE DIAGNOSIS:  RIGHT URETERAL CALCULUS  PROCEDURE:  Procedure(s): CYSTOSCOPY WITH RETROGRADE PYELOGRAM, URETEROSCOPY, STONE EXTRACTION AND STENT EXCHANGE (Right) HOLMIUM LASER APPLICATION (Right)  SURGEON:  Surgeon(s) and Role:    * Malen GauzePatrick L Sayaka Hoeppner, MD - Primary  PHYSICIAN ASSISTANT:   ASSISTANTS: none   ANESTHESIA:   general  EBL:     BLOOD ADMINISTERED:none  DRAINS: right 6x26 JJ ureteral stent with tether  LOCAL MEDICATIONS USED:  NONE  SPECIMEN:  Source of Specimen:  right renal calculus  DISPOSITION OF SPECIMEN:  N/A  COUNTS:  YES  TOURNIQUET:  * No tourniquets in log *  DICTATION: .Note written in EPIC  PLAN OF CARE: Discharge to home after PACU  PATIENT DISPOSITION:  PACU - hemodynamically stable.   Delay start of Pharmacological VTE agent (>24hrs) due to surgical blood loss or risk of bleeding: not applicable

## 2015-06-12 NOTE — H&P (View-Only) (Signed)
H&P  Chief Complaint: Right sided proximal stone  History of Present Illness: Shannon Dawson is a 39 y.o. year old with h/o nephrolithiasis came in with several days of worsening right sided abdominal pain with n/v. The patient started developing fevers and went to the ED. She had a CT scan that showed a 7 mm right proximal ureteral stone.  Past Medical History  Diagnosis Date  . Anxiety   . Mental disorder   . Depression   . Headache(784.0)   . Family history of anesthesia complication   . Ovarian cyst     Past Surgical History  Procedure Laterality Date  . Cholecystectomy  2010    Home Medications:    Medication List    ASK your doctor about these medications        cloNIDine 0.1 MG tablet  Commonly known as:  CATAPRES  Take 1 tablet (0.1 mg total) by mouth 3 (three) times daily. For anxiety     gabapentin 300 MG capsule  Commonly known as:  NEURONTIN  Take 1 capsule (300 mg total) by mouth 3 (three) times daily. For anxiety and management of pain.  May be helpful for sleep.     ibuprofen 200 MG tablet  Commonly known as:  ADVIL,MOTRIN  Take 1-4 tablets (200-800 mg total) by mouth every 6 (six) hours as needed (for pain). For pain     phenylephrine-shark liver oil-mineral oil-petrolatum 0.25-3-14-71.9 % rectal ointment  Commonly known as:  PREPARATION H  Place 1 application rectally 2 (two) times daily as needed. Apply rectally as needed: for hemorroids     QUEtiapine 200 MG tablet  Commonly known as:  SEROQUEL  Take 1 tablet (200 mg total) by mouth 3 (three) times daily. For mood control     traZODone 100 MG tablet  Commonly known as:  DESYREL  Take 1 tablet (100 mg total) by mouth at bedtime as needed for sleep (for sleep ). For sleep     valproic acid 250 MG capsule  Commonly known as:  DEPAKENE  Take 1 capsule (250 mg total) by mouth 2 (two) times daily. For mood stabilization        Allergies:  Allergies  Allergen Reactions  . Tramadol Itching   Severe  . Vicodin [Hydrocodone-Acetaminophen] Itching    Itching is severe.     History reviewed. No pertinent family history.  Social History:  reports that she has been smoking Cigarettes.  She has a 27 pack-year smoking history. She does not have any smokeless tobacco history on file. She reports that she drinks alcohol. She reports that she uses illicit drugs (Heroin) about 7 times per week.  ROS: A complete review of systems was performed.  All systems are negative except for pertinent findings as noted.  Physical Exam:  Vital signs in last 24 hours: Temp:  [98.8 F (37.1 C)-102.1 F (38.9 C)] 98.8 F (37.1 C) (03/03 0050) Pulse Rate:  [86-140] 91 (03/02 2330) Resp:  [15-24] 18 (03/03 0050) BP: (93-122)/(65-95) 93/65 mmHg (03/03 0050) SpO2:  [94 %-98 %] 98 % (03/03 0050) Weight:  [68.04 kg (150 lb)] 68.04 kg (150 lb) (03/02 1925) Constitutional:  Alert and oriented, No acute distress Cardiovascular: Regular rate and rhythm, No JVD Respiratory: Normal respiratory effort, Lungs clear bilaterally GI: Abdomen is soft, mildly tender, nondistended, no abdominal masses GU: No CVA tenderness Lymphatic: No lymphadenopathy Neurologic: Grossly intact, no focal deficits Psychiatric: Normal mood and affect  Laboratory Data:   Recent Labs  05/18/15   1940  WBC 7.3  HGB 12.0  HCT 36.2  PLT 248     Recent Labs  05/18/15 1940  NA 137  K 3.4*  CL 101  GLUCOSE 151*  BUN 9  CALCIUM 9.5  CREATININE 1.15*     Results for orders placed or performed during the hospital encounter of 05/18/15 (from the past 24 hour(s))  I-Stat beta hCG blood, ED ([facility], [facility], [facility] only)     Status: None   Collection Time: 05/18/15  7:39 PM  Result Value Ref Range   I-stat hCG, quantitative <5.0 <5 mIU/mL   Comment 3          Lipase, blood     Status: None   Collection Time: 05/18/15  7:40 PM  Result Value Ref Range   Lipase 16 11 - 51 U/L  Comprehensive metabolic panel     Status: Abnormal    Collection Time: 05/18/15  7:40 PM  Result Value Ref Range   Sodium 137 135 - 145 mmol/L   Potassium 3.4 (L) 3.5 - 5.1 mmol/L   Chloride 101 101 - 111 mmol/L   CO2 20 (L) 22 - 32 mmol/L   Glucose, Bld 151 (H) 65 - 99 mg/dL   BUN 9 6 - 20 mg/dL   Creatinine, Ser 1.15 (H) 0.44 - 1.00 mg/dL   Calcium 9.5 8.9 - 10.3 mg/dL   Total Protein 7.1 6.5 - 8.1 g/dL   Albumin 2.9 (L) 3.5 - 5.0 g/dL   AST 17 15 - 41 U/L   ALT 11 (L) 14 - 54 U/L   Alkaline Phosphatase 73 38 - 126 U/L   Total Bilirubin 0.3 0.3 - 1.2 mg/dL   GFR calc non Af Amer 59 (L) >60 mL/min   GFR calc Af Amer >60 >60 mL/min   Anion gap 16 (H) 5 - 15  CBC     Status: None   Collection Time: 05/18/15  7:40 PM  Result Value Ref Range   WBC 7.3 4.0 - 10.5 K/uL   RBC 3.93 3.87 - 5.11 MIL/uL   Hemoglobin 12.0 12.0 - 15.0 g/dL   HCT 36.2 36.0 - 46.0 %   MCV 92.1 78.0 - 100.0 fL   MCH 30.5 26.0 - 34.0 pg   MCHC 33.1 30.0 - 36.0 g/dL   RDW 13.5 11.5 - 15.5 %   Platelets 248 150 - 400 K/uL  I-Stat CG4 Lactic Acid, ED     Status: None   Collection Time: 05/18/15  7:42 PM  Result Value Ref Range   Lactic Acid, Venous 1.98 0.5 - 2.0 mmol/L  Urinalysis, Routine w reflex microscopic (not at [facility])     Status: Abnormal   Collection Time: 05/18/15  7:43 PM  Result Value Ref Range   Color, Urine AMBER (A) YELLOW   APPearance CLOUDY (A) CLEAR   Specific Gravity, Urine 1.018 1.005 - 1.030   pH 6.0 5.0 - 8.0   Glucose, UA NEGATIVE NEGATIVE mg/dL   Hgb urine dipstick LARGE (A) NEGATIVE   Bilirubin Urine NEGATIVE NEGATIVE   Ketones, ur NEGATIVE NEGATIVE mg/dL   Protein, ur 100 (A) NEGATIVE mg/dL   Nitrite NEGATIVE NEGATIVE   Leukocytes, UA LARGE (A) NEGATIVE  Urine microscopic-add on     Status: Abnormal   Collection Time: 05/18/15  7:43 PM  Result Value Ref Range   Squamous Epithelial / LPF 6-30 (A) NONE SEEN   WBC, UA TOO NUMEROUS TO COUNT 0 - 5 WBC/hpf   RBC /   HPF TOO NUMEROUS TO COUNT 0 - 5 RBC/hpf   Bacteria, UA MANY (A) NONE  SEEN   Urine-Other YEAST PRESENT   Urinalysis, Routine w reflex microscopic (not at [facility])     Status: Abnormal   Collection Time: 05/19/15  1:45 AM  Result Value Ref Range   Color, Urine YELLOW YELLOW   APPearance CLOUDY (A) CLEAR   Specific Gravity, Urine 1.041 (H) 1.005 - 1.030   pH 6.5 5.0 - 8.0   Glucose, UA NEGATIVE NEGATIVE mg/dL   Hgb urine dipstick MODERATE (A) NEGATIVE   Bilirubin Urine NEGATIVE NEGATIVE   Ketones, ur NEGATIVE NEGATIVE mg/dL   Protein, ur NEGATIVE NEGATIVE mg/dL   Nitrite NEGATIVE NEGATIVE   Leukocytes, UA MODERATE (A) NEGATIVE  Urine microscopic-add on     Status: Abnormal   Collection Time: 05/19/15  1:45 AM  Result Value Ref Range   Squamous Epithelial / LPF 0-5 (A) NONE SEEN   WBC, UA TOO NUMEROUS TO COUNT 0 - 5 WBC/hpf   RBC / HPF TOO NUMEROUS TO COUNT 0 - 5 RBC/hpf   Bacteria, UA MANY (A) NONE SEEN   Urine-Other YEAST PRESENT    No results found for this or any previous visit (from the past 240 hour(s)).  Renal Function:  Recent Labs  05/18/15 1940  CREATININE 1.15*   Estimated Creatinine Clearance: 61.6 mL/min (by C-G formula based on Cr of 1.15).  Radiologic Imaging: Ct Abdomen Pelvis W Contrast  05/18/2015  CLINICAL DATA:  Acute onset right lower quadrant abdominal pain, radiating to the back. Fever and chills. Initial encounter. EXAM: CT ABDOMEN AND PELVIS WITH CONTRAST TECHNIQUE: Multidetector CT imaging of the abdomen and pelvis was performed using the standard protocol following bolus administration of intravenous contrast. CONTRAST:  100mL OMNIPAQUE IOHEXOL 300 MG/ML  SOLN COMPARISON:  CT of the chest, abdomen and pelvis performed 07/18/2009 FINDINGS: Mild bibasilar atelectasis is noted. Trace pericardial fluid remains within normal limits. The liver and spleen are unremarkable in appearance. The patient is status post cholecystectomy, with clips noted at the gallbladder fossa. The pancreas and adrenal glands are unremarkable. There is  mild-to-moderate right-sided hydronephrosis, with an obstructing 7 x 7 mm stone noted at the proximal right ureter, just below the right renal pelvis. A nonobstructing 7 mm stone is noted at the lower pole of the right kidney. Additional smaller nonobstructing left renal stones are seen. There is a somewhat complex mildly hypoattenuating mass at the interpole region of the right kidney, measuring 2.3 cm. This is difficult to fully assess, due to poor enhancement of the right kidney secondary to the patient's hydronephrosis. No free fluid is identified. The small bowel is unremarkable in appearance. The stomach is within normal limits. No acute vascular abnormalities are seen. Minimal calcification is noted at the distal abdominal aorta. A retroaortic left renal vein is noted. The appendix is normal in caliber, without evidence of appendicitis. The colon is unremarkable in appearance. The bladder is mildly distended and grossly unremarkable. A large right adnexal cystic lesion is noted, measuring 10.6 x 9.6 x 8.7 cm. The left ovary is unremarkable in appearance. The uterus is grossly unremarkable, though the cervix is difficult to fully assess. No inguinal lymphadenopathy is seen. No acute osseous abnormalities are identified. IMPRESSION: 1. Mild to moderate right-sided hydronephrosis, with an obstructing 7 x 7 mm stone at the proximal right ureter, just below the right renal pelvis. 2. Somewhat complex mildly hypoattenuating mass at the interpole region of the right kidney, measuring   2.3 cm. This is difficult to fully assess, due to poor enhancement of the right kidney secondary to the patient's hydronephrosis. MRI of the abdomen is recommended for further evaluation, to exclude malignancy; ultrasound could be considered, but may not provide sufficient confirmation in this case. 3. Large right adnexal cystic lesion, measuring 10.6 x 9.6 x 8.7 cm. This could be further assessed on pelvic ultrasound, or if the patient  is undergoing abdominal MRI, concurrent pelvic MRI could be considered. 4. Nonobstructing bilateral renal stones, measuring up to 7 mm in size. 5. Mild bibasilar atelectasis noted. Electronically Signed   By: Jeffery  Chang M.D.   On: 05/18/2015 22:53    Impression/Assessment:  39 y.o. female with 7 mm right proximal ureteral stone with evidence of infection.  Plan:  - Emergent right sided stent. R/B discussed - Admit to urology post op  Justinn Welter [address] 05/19/2015, 2:24 AM       

## 2015-06-12 NOTE — Interval H&P Note (Signed)
History and Physical Interval Note:  06/12/2015 1:05 PM  Shannon Dawson  has presented today for surgery, with the diagnosis of RIGHT URETERAL CALCULUS  The various methods of treatment have been discussed with the patient and family. After consideration of risks, benefits and other options for treatment, the patient has consented to  Procedure(s): CYSTOSCOPY WITH RETROGRADE PYELOGRAM, URETEROSCOPY, STONE EXTRACTION AND STENT EXCHANGE (Right) HOLMIUM LASER APPLICATION (Right) as a surgical intervention .  The patient's history has been reviewed, patient examined, no change in status, stable for surgery.  I have reviewed the patient's chart and labs.  Questions were answered to the patient's satisfaction.     MCKENZIE, PATRICK L

## 2015-06-12 NOTE — Anesthesia Preprocedure Evaluation (Signed)
Anesthesia Evaluation  Patient identified by MRN, date of birth, ID band Patient awake    Reviewed: Allergy & Precautions, NPO status , Patient's Chart, lab work & pertinent test results  History of Anesthesia Complications Negative for: history of anesthetic complications  Airway Mallampati: II   Neck ROM: Full    Dental  (+) Poor Dentition, Edentulous Upper   Pulmonary neg shortness of breath, neg sleep apnea, neg recent URI, Current Smoker, neg PE   breath sounds clear to auscultation       Cardiovascular negative cardio ROS   Rhythm:Regular     Neuro/Psych  Headaches, PSYCHIATRIC DISORDERS Depression Bipolar Disorder    GI/Hepatic negative GI ROS, Neg liver ROS,   Endo/Other  Morbid obesity  Renal/GU Renal disease     Musculoskeletal   Abdominal   Peds  Hematology negative hematology ROS (+)   Anesthesia Other Findings   Reproductive/Obstetrics                             Anesthesia Physical Anesthesia Plan  ASA: II  Anesthesia Plan: General   Post-op Pain Management:    Induction: Inhalational  Airway Management Planned: LMA  Additional Equipment: None  Intra-op Plan:   Post-operative Plan: Extubation in OR  Informed Consent: I have reviewed the patients History and Physical, chart, labs and discussed the procedure including the risks, benefits and alternatives for the proposed anesthesia with the patient or authorized representative who has indicated his/her understanding and acceptance.   Dental advisory given  Plan Discussed with: CRNA and Surgeon  Anesthesia Plan Comments:         Anesthesia Quick Evaluation

## 2015-06-12 NOTE — Anesthesia Postprocedure Evaluation (Signed)
Anesthesia Post Note  Patient: Servando SnareMary Sanko  Procedure(s) Performed: Procedure(s) (LRB): CYSTOSCOPY WITH RETROGRADE PYELOGRAM, URETEROSCOPY, STONE EXTRACTION AND STENT EXCHANGE (Right) HOLMIUM LASER APPLICATION (Right)  Patient location during evaluation: PACU Anesthesia Type: General Level of consciousness: awake Pain management: pain level controlled Vital Signs Assessment: post-procedure vital signs reviewed and stable Respiratory status: spontaneous breathing and respiratory function stable Cardiovascular status: stable Postop Assessment: no signs of nausea or vomiting Anesthetic complications: no    Last Vitals:  Filed Vitals:   06/12/15 1502 06/12/15 1515  BP: 142/85 134/80  Pulse: 60 69  Temp:    Resp: 13 18    Last Pain:  Filed Vitals:   06/12/15 1516  PainSc: 5                  Stran Raper

## 2015-06-14 ENCOUNTER — Encounter (HOSPITAL_BASED_OUTPATIENT_CLINIC_OR_DEPARTMENT_OTHER): Payer: Self-pay | Admitting: Urology

## 2015-06-14 NOTE — Op Note (Signed)
.  Preoperative diagnosis: right renal stones  Postoperative diagnosis: Same  Procedure: 1 cystoscopy 2. Right retrograde pyelography 3.  Intraoperative fluoroscopy, under one hour, with interpretation 4.  right ureteroscopic stone manipulation with laser lithotripsy 5.  right 6 x 26 JJ stent exchange  Attending: Cleda MccreedyPatrick Mackenzie  Anesthesia: General  Estimated blood loss: None  Drains: right 6 x 26 JJ ureteral stent with tether  Specimens: stone for analysis  Antibiotics: rocpehin  Findings: right lower and mid pole renal calculus. nohydronephrosis. No masses/lesions in the bladder. Ureteral orifices in normal anatomic location.  Indications: Patient is a 40 year old female with a history of right ureteral stone who is status post stent placement. After discussing treatment options, they decided proceed with right ureteroscopic stone manipulation.  Procedure her in detail: The patient was brought to the operating room and a brief timeout was done to ensure correct patient, correct procedure, correct site.  General anesthesia was administered patient was placed in dorsal lithotomy position.  Her genitalia was then prepped and draped in usual sterile fashion.  A rigid 22 French cystoscope was passed in the urethra and the bladder.  Bladder was inspected free masses or lesions.  the ureteral orifices were in the normal orthotopic locations. Using a grasper the ureteral stent was brought to the urethral meatus. Through the stent a zipwire was advanced up to the renal pelvis. The stent was then removed. a 6 french ureteral catheter was then instilled into the right ureteral orifice.  a gentle retrograde was obtained and findings noted above.  we then removed the cystoscope and cannulated the right ureteral orifice with a semirigid ureteroscope.  We located no stone in the ureter. We then advanced a sensor wire into the renal pelvis. The scope was removed and we advanced a 12/14x38cm access  sheath up to the renal pelvis. We then used the flexible ureteroscope to perform nephroscopy. We located a stone un the lower and mid poles. Using a 200 nm laser fiber and fragmented the stone into smaller pieces.  the pieces were then removed with a Ngage basket.   once all stone fragments were removed we then removed the access sheath under direct vision and noted no injury to the ureter. We then placed a 6 x 26 double-j ureteral stent over the original zip wire.  We then removed the wire and good coil was noted in the the renal pelvis under fluoroscopy and the bladder under direct vision.  the bladder was then drained and this concluded the procedure which was well tolerated by patient.  Complications: None  Condition: Stable, extubated, transferred to PACU  Plan: Patient is to be discharged home as to follow-up in 3 weeks. She is to remove her stent in 3 days by pulling the tether

## 2015-07-13 ENCOUNTER — Emergency Department (HOSPITAL_COMMUNITY): Payer: Self-pay

## 2015-07-13 ENCOUNTER — Emergency Department (HOSPITAL_COMMUNITY)
Admission: EM | Admit: 2015-07-13 | Discharge: 2015-07-13 | Disposition: A | Discharge status: Home / Self Care | Payer: Self-pay | Attending: Emergency Medicine | Admitting: Emergency Medicine

## 2015-07-13 ENCOUNTER — Encounter (HOSPITAL_COMMUNITY): Payer: Self-pay | Admitting: Emergency Medicine

## 2015-07-13 DIAGNOSIS — R109 Unspecified abdominal pain: Secondary | ICD-10-CM

## 2015-07-13 DIAGNOSIS — Z3202 Encounter for pregnancy test, result negative: Secondary | ICD-10-CM | POA: Insufficient documentation

## 2015-07-13 DIAGNOSIS — Z87442 Personal history of urinary calculi: Secondary | ICD-10-CM | POA: Insufficient documentation

## 2015-07-13 DIAGNOSIS — N281 Cyst of kidney, acquired: Secondary | ICD-10-CM

## 2015-07-13 DIAGNOSIS — G8929 Other chronic pain: Secondary | ICD-10-CM | POA: Insufficient documentation

## 2015-07-13 DIAGNOSIS — N83201 Unspecified ovarian cyst, right side: Secondary | ICD-10-CM

## 2015-07-13 DIAGNOSIS — R10A1 Flank pain, right side: Secondary | ICD-10-CM

## 2015-07-13 DIAGNOSIS — Z8659 Personal history of other mental and behavioral disorders: Secondary | ICD-10-CM | POA: Insufficient documentation

## 2015-07-13 DIAGNOSIS — F17210 Nicotine dependence, cigarettes, uncomplicated: Secondary | ICD-10-CM | POA: Insufficient documentation

## 2015-07-13 LAB — URINALYSIS, ROUTINE W REFLEX MICROSCOPIC
BILIRUBIN URINE: NEGATIVE
Glucose, UA: NEGATIVE mg/dL
KETONES UR: NEGATIVE mg/dL
NITRITE: NEGATIVE
PH: 5.5 (ref 5.0–8.0)
PROTEIN: NEGATIVE mg/dL
Specific Gravity, Urine: 1.022 (ref 1.005–1.030)

## 2015-07-13 LAB — CBC WITH DIFFERENTIAL/PLATELET
BASOS PCT: 0 %
Basophils Absolute: 0 10*3/uL (ref 0.0–0.1)
EOS ABS: 0.1 10*3/uL (ref 0.0–0.7)
Eosinophils Relative: 2 %
HCT: 38.1 % (ref 36.0–46.0)
Hemoglobin: 12.7 g/dL (ref 12.0–15.0)
Lymphocytes Relative: 46 %
Lymphs Abs: 2.3 10*3/uL (ref 0.7–4.0)
MCH: 29.7 pg (ref 26.0–34.0)
MCHC: 33.3 g/dL (ref 30.0–36.0)
MCV: 89.2 fL (ref 78.0–100.0)
MONOS PCT: 8 %
Monocytes Absolute: 0.4 10*3/uL (ref 0.1–1.0)
NEUTROS ABS: 2.2 10*3/uL (ref 1.7–7.7)
NEUTROS PCT: 44 %
PLATELETS: 220 10*3/uL (ref 150–400)
RBC: 4.27 MIL/uL (ref 3.87–5.11)
RDW: 14.4 % (ref 11.5–15.5)
WBC: 5.1 10*3/uL (ref 4.0–10.5)

## 2015-07-13 LAB — BASIC METABOLIC PANEL
Anion gap: 10 (ref 5–15)
BUN: 10 mg/dL (ref 6–20)
CALCIUM: 9.9 mg/dL (ref 8.9–10.3)
CO2: 24 mmol/L (ref 22–32)
CREATININE: 0.64 mg/dL (ref 0.44–1.00)
Chloride: 105 mmol/L (ref 101–111)
GFR calc non Af Amer: >60 mL/min (ref >60)
Glucose, Bld: 93 mg/dL (ref 65–99)
Potassium: 3.9 mmol/L (ref 3.5–5.1)
SODIUM: 139 mmol/L (ref 135–145)

## 2015-07-13 LAB — URINE MICROSCOPIC-ADD ON
Bacteria, UA: NONE SEEN
RBC / HPF: NONE SEEN RBC/hpf (ref 0–5)

## 2015-07-13 LAB — I-STAT BETA HCG BLOOD, ED (MC, WL, AP ONLY)

## 2015-07-13 MED ORDER — HYDROMORPHONE HCL 1 MG/ML IJ SOLN
1.0000 mg | Freq: Once | INTRAMUSCULAR | Status: AC
  Administered 2015-07-13: 1 mg via INTRAVENOUS
  Filled 2015-07-13: qty 1

## 2015-07-13 MED ORDER — ONDANSETRON HCL 4 MG/2ML IJ SOLN
4.0000 mg | Freq: Once | INTRAMUSCULAR | Status: AC
  Administered 2015-07-13: 4 mg via INTRAVENOUS
  Filled 2015-07-13: qty 2

## 2015-07-13 MED ORDER — OXYCODONE-ACETAMINOPHEN 5-325 MG PO TABS: 2.0000 | ORAL_TABLET | Freq: Four times a day (QID) | ORAL | Status: DC | PRN

## 2015-07-13 MED ORDER — OXYCODONE-ACETAMINOPHEN 5-325 MG PO TABS
1.0000 | ORAL_TABLET | ORAL | Status: AC | PRN
  Administered 2015-07-13 (×2): 1 via ORAL
  Filled 2015-07-13 (×2): qty 1

## 2015-07-13 MED ORDER — ONDANSETRON HCL 4 MG PO TABS: 4.0000 mg | ORAL_TABLET | Freq: Four times a day (QID) | ORAL | Status: DC

## 2015-07-13 NOTE — ED Notes (Addendum)
Pt was diagnosed with kidney stones at the end of March. Had lithotripsy done and stents have been removed. Was recently treated for a UTI as well. Pt reports flank pain for the past 4 days like when she first started feeling the kidney stones. Pt reports she was sent here by urology due to insurance status.

## 2015-07-13 NOTE — Discharge Instructions (Signed)
You must have further testing done to evaluate the cyst on your kidney and on your ovary.  Our case manager will call you with a follow-up appointment.  Flank Pain Flank pain refers to pain that is located on the side of the body between the upper abdomen and the back. The pain may occur over a short period of time (acute) or may be long-term or reoccurring (chronic). It may be mild or severe. Flank pain can be caused by many things. CAUSES  Some of the more common causes of flank pain include:  Muscle strains.   Muscle spasms.   A disease of your spine (vertebral disk disease).   A lung infection (pneumonia).   Fluid around your lungs (pulmonary edema).   A kidney infection.   Kidney stones.   A very painful skin rash caused by the chickenpox virus (shingles).   Gallbladder disease.  HOME CARE INSTRUCTIONS  Home care will depend on the cause of your pain. In general,  Rest as directed by your caregiver.  Drink enough fluids to keep your urine clear or pale yellow.  Only take over-the-counter or prescription medicines as directed by your caregiver. Some medicines may help relieve the pain.  Tell your caregiver about any changes in your pain.  Follow up with your caregiver as directed. SEEK IMMEDIATE MEDICAL CARE IF:   Your pain is not controlled with medicine.   You have new or worsening symptoms.  Your pain increases.   You have abdominal pain.   You have shortness of breath.   You have persistent nausea or vomiting.   You have swelling in your abdomen.   You feel faint or pass out.   You have blood in your urine.  You have a fever or persistent symptoms for more than 2-3 days.  You have a fever and your symptoms suddenly get worse. MAKE SURE YOU:   Understand these instructions.  Will watch your condition.  Will get help right away if you are not doing well or get worse.   This information is not intended to replace advice given to  you by your health care provider. Make sure you discuss any questions you have with your health care provider.   Document Released: 04/25/2005 Document Revised: 11/27/2011 Document Reviewed: 10/17/2011 Else[employer] Interactive Patient Education [employer]2016 [employer]

## 2015-07-13 NOTE — ED Provider Notes (Signed)
CSN: [id number]     Arrival date & time 07/13/15  1416 History   First MD Initiated Contact with Patient 07/13/15 1604     Chief Complaint  Patient presents with  . Flank Pain     (Consider location/radiation/quality/duration/timing/severity/associated sxs/prior Treatment) HPI Comments: Patient presents to the emergency department with chief complaint of right flank pain. She states that she had a recent lithotripsy, and believes that she is still passing stones. She states that she had a ureteral stent placed, but she pulled this out 5 days after placement as directed. She denies any fevers or chills. Denies any nausea or vomiting. She states that she is normally able to tolerate her pain by taking OTC medications. She states that she went to the urology practice today, and was referred to the emergency department because of finances. She states that she has had some hematuria and dysuria.  The history is provided by the patient. No language interpreter was used.    Past Medical History  Diagnosis Date  . Depression   . Headache(784.0)   . Ovarian cyst     BILATERAL  . GAD (generalized anxiety disorder)   . OCD (obsessive compulsive disorder)   . Right ureteral stone   . Nephrolithiasis     bilateral non-obstructive per renal u/s 05-27-2015  . History of kidney stones   . Renal cyst, right   . Chronic pain of right knee     post knee injury 2011  . Irregular menstrual cycle   . History of heroin abuse     per epic documentation and pt detoxed 2011--  verbalized by pt clean since 2011  . History of alcohol abuse     pt verbalized by pt no alcohol since 2011   Past Surgical History  Procedure Laterality Date  . Cystoscopy with retrograde pyelogram, ureteroscopy and stent placement Right 05/19/2015    Procedure: CYSTOSCOPY WITH RETROGRADE PYELOGRAM, URETEROSCOPY AND STENT PLACEMENT;  Surgeon: Malen Gauze, MD;  Location: [facility];  Service: Urology;  Laterality: Right;  .  Laparoscopic cholecystectomy  05-13-2009  . I & d open right knee tibia/ fibula fx's/  im nailing tibia/  closure traumatic wounds  07-18-2009  . Cysto/  right ureteroscopic laser lithotripsy stone extraction  03-14-2008  . Orif right patella fx  2013  . Cystoscopy with retrograde pyelogram, ureteroscopy and stent placement Right 06/12/2015    Procedure: CYSTOSCOPY WITH RETROGRADE PYELOGRAM, URETEROSCOPY, STONE EXTRACTION AND STENT EXCHANGE;  Surgeon: Malen Gauze, MD;  Location: [facility];  Service: Urology;  Laterality: Right;  . Holmium laser application Right 06/12/2015    Procedure: HOLMIUM LASER APPLICATION;  Surgeon: Malen Gauze, MD;  Location: [facility];  Service: Urology;  Laterality: Right;   History reviewed. No pertinent family history. Social History  Substance Use Topics  . Smoking status: Current Every Day Smoker -- 1.00 packs/day for 25 years    Types: Cigarettes  . Smokeless tobacco: Never Used  . Alcohol Use: No     Comment: hx alcohol abuse (per pt no alcohol since 2011)   OB History    No data available     Review of Systems  Constitutional: Negative for fever and chills.  Respiratory: Negative for shortness of breath.   Cardiovascular: Negative for chest pain.  Gastrointestinal: Negative for nausea, vomiting, diarrhea and constipation.  Genitourinary: Positive for hematuria and flank pain. Negative for dysuria.  All other systems reviewed and are negative.  Allergies  Tramadol and Vicodin  Home Medications   Prior to Admission medications   Medication Sig Start Date End Date Taking? Authorizing Provider  acetaminophen (TYLENOL) 500 MG tablet Take 500 mg by mouth every 8 (eight) hours as needed for moderate pain.   Yes Historical Provider, MD  ondansetron (ZOFRAN) 4 MG tablet Take 1 tablet (4 mg total) by mouth every 6 (six) hours. Patient not taking: Reported on 07/13/2015 06/01/15   Loren Racer, MD   oxyCODONE-acetaminophen (PERCOCET) 10-325 MG tablet Take 1 tablet by mouth every 4 (four) hours as needed for pain. Patient not taking: Reported on 07/13/2015 06/12/15   Malen Gauze, MD  oxyCODONE-acetaminophen (PERCOCET/ROXICET) 5-325 MG tablet Take 1-2 tablets by mouth every 4 (four) hours as needed for severe pain. Patient not taking: Reported on 07/13/2015 06/12/15   Malen Gauze, MD  sulfamethoxazole-trimethoprim (BACTRIM DS,SEPTRA DS) 800-160 MG tablet Take 1 tablet by mouth 2 (two) times daily. Patient not taking: Reported on 07/13/2015 06/12/15   Malen Gauze, MD   BP 141/103 mmHg  Pulse 95  Temp(Src) 98.1 F (36.7 C) (Oral)  Resp 18  Ht  (1.6 m)  Wt 77.111 kg  BMI 30.12 kg/m2  SpO2 100% Physical Exam  Constitutional: She is oriented to person, place, and time. She appears well-developed and well-nourished.  HENT:  Head: Normocephalic and atraumatic.  Eyes: Conjunctivae and EOM are normal. Pupils are equal, round, and reactive to light.  Neck: Normal range of motion. Neck supple.  Cardiovascular: Normal rate and regular rhythm.  Exam reveals no gallop and no friction rub.   No murmur heard. Pulmonary/Chest: Effort normal and breath sounds normal. No respiratory distress. She has no wheezes. She has no rales. She exhibits no tenderness.  Abdominal: Soft. Bowel sounds are normal. She exhibits no distension and no mass. There is no tenderness. There is no rebound and no guarding.  No focal abdominal tenderness, no RLQ tenderness or pain at McBurney's point, no RUQ tenderness or Murphy's sign, no left-sided abdominal tenderness, no fluid wave, or signs of peritonitis   Musculoskeletal: Normal range of motion. She exhibits no edema or tenderness.  Neurological: She is alert and oriented to person, place, and time.  Skin: Skin is warm and dry.  Psychiatric: She has a normal mood and affect. Her behavior is normal. Judgment and thought content normal.  Nursing  note and vitals reviewed.   ED Course  Procedures (including critical care time) Results for orders placed or performed during the hospital encounter of 07/13/15  Urinalysis, Routine w reflex microscopic-may I&O cath if menses (not at [facility])  Result Value Ref Range   Color, Urine YELLOW YELLOW   APPearance CLOUDY (A) CLEAR   Specific Gravity, Urine 1.022 1.005 - 1.030   pH 5.5 5.0 - 8.0   Glucose, UA NEGATIVE NEGATIVE mg/dL   Hgb urine dipstick LARGE (A) NEGATIVE   Bilirubin Urine NEGATIVE NEGATIVE   Ketones, ur NEGATIVE NEGATIVE mg/dL   Protein, ur NEGATIVE NEGATIVE mg/dL   Nitrite NEGATIVE NEGATIVE   Leukocytes, UA TRACE (A) NEGATIVE  Urine microscopic-add on  Result Value Ref Range   Squamous Epithelial / LPF 6-30 (A) NONE SEEN   WBC, UA 0-5 0 - 5 WBC/hpf   RBC / HPF NONE SEEN 0 - 5 RBC/hpf   Bacteria, UA NONE SEEN NONE SEEN  CBC with Differential/Platelet  Result Value Ref Range   WBC 5.1 4.0 - 10.5 K/uL   RBC 4.27 3.87 - 5.11  MIL/uL   Hemoglobin 12.7 12.0 - 15.0 g/dL   HCT 40.938.1 81.136.0 - 91.446.0 %   MCV 89.2 78.0 - 100.0 fL   MCH 29.7 26.0 - 34.0 pg   MCHC 33.3 30.0 - 36.0 g/dL   RDW 78.214.4 95.611.5 - 21.315.5 %   Platelets 220 150 - 400 K/uL   Neutrophils Relative % 44 %   Neutro Abs 2.2 1.7 - 7.7 K/uL   Lymphocytes Relative 46 %   Lymphs Abs 2.3 0.7 - 4.0 K/uL   Monocytes Relative 8 %   Monocytes Absolute 0.4 0.1 - 1.0 K/uL   Eosinophils Relative 2 %   Eosinophils Absolute 0.1 0.0 - 0.7 K/uL   Basophils Relative 0 %   Basophils Absolute 0.0 0.0 - 0.1 K/uL  Basic metabolic panel  Result Value Ref Range   Sodium 139 135 - 145 mmol/L   Potassium 3.9 3.5 - 5.1 mmol/L   Chloride 105 101 - 111 mmol/L   CO2 24 22 - 32 mmol/L   Glucose, Bld 93 65 - 99 mg/dL   BUN 10 6 - 20 mg/dL   Creatinine, Ser 0.860.64 0.44 - 1.00 mg/dL   Calcium 9.9 8.9 - 57.810.3 mg/dL   GFR calc non Af Amer >60 >60 mL/min   GFR calc Af Amer >60 >60 mL/min   Anion gap 10 5 - 15  I-Stat Beta hCG blood, ED ([facility],  [facility], [facility] only)  Result Value Ref Range   I-stat hCG, quantitative <5.0 <5 mIU/mL   Comment 3           Ct Renal Stone Study  07/13/2015  CLINICAL DATA:  Lithotripsy late March 2017, stents have been removed, recently treated for urinary tract infection, reporting flank pain for 4 days EXAM: CT ABDOMEN AND PELVIS WITHOUT CONTRAST TECHNIQUE: Multidetector CT imaging of the abdomen and pelvis was performed following the standard protocol without IV contrast. COMPARISON:  05/18/2015 FINDINGS: Lower chest:  Visualized portions of the lung bases clear Hepatobiliary: Status post cholecystectomy.  Liver normal. Pancreas: Normal pancreas Spleen: Spleen is normal Adrenals/Urinary Tract: Adrenal glands are normal. No renal calculi on either side except for 1 mm stone midpole left kidney. 2 cm low-attenuation lesion upper pole right kidney stable. No ureteral stones. No hydronephrosis. No perinephric inflammation. Bladder is normal. Stomach/Bowel: Nonobstructive bowel gas pattern. Stool throughout the colon. Appendix is normal. Small bowel is normal. Vascular/Lymphatic: Retro aortic left renal vein. Mild atherosclerotic calcification of the aortoiliac vessels somewhat prominent for patient age. No significant adenopathy. Reproductive: Uterus and left ovary appear normal. 10 cm cystic right adnexal mass again identified similar to prior study. Other: No significant free fluid Musculoskeletal: No acute osseous abnormalities IMPRESSION: 1. Stable large cystic right adnexal mass. Further evaluation with MRI or surgical consultation recommended. This recommendation follows ACR consensus guidelines: White Paper of the ACR Incidental Findings Committee II on Adnexal Findings. J Am Coll Radiol 909 712 [id number]. 2. Known complex cystic lesion right ovary. Malignancy not excluded. As previously described in report of 05/18/2015 study, renal MRI recommended. 3. 1 mm nonobstructing stone midpole left kidney. No hydronephrosis or  perinephric inflammation. Electronically Signed   By: Esperanza Heiraymond  Rubner M.D.   On: 07/13/2015 17:33    I have personally reviewed and evaluated these images and lab results as part of my medical decision-making.   EKG Interpretation None      MDM   Final diagnoses:  Renal cyst  Cyst of right ovary  Right flank pain  Patient with right flank pain. History of extensive kidney stones. Recent lithotripsy 1 month ago with ureteral stent placement. Patient complains of worsening pain. Will check CT, urinalysis, and labs. We'll treat pain. Will reassess.  CT is negative for right-sided renal stone, however there is findings as above. Stable large right adnexal mass, as well as complex lesion to right ovary which are both been visualized before. The patient is aware of these findings. I asked her if she had had a chance follow-up, and she said no because of lack of insurance. I discussed patient with the case manager, who will assist the patient in getting close follow-up. No further workup emergently today. Will discharged home. Patient understands and agrees with the plan. She is stable for discharge.  Patient discussed with Dr. Particia Nearing, who agrees with the plan.   Roxy Horseman, PA-C 07/13/15 1948

## 2015-07-13 NOTE — Progress Notes (Addendum)
EDCM consulted regarding pcp and insurance issues.  EDPA requesting assistance with OBGYN follow up.  EDCM spoke to patient at bedside.  [facility].  [facility] AS[facility].  EDCM also provided patient with contact information for [facility] on [address].  [facility].  [facility] and call patient with appointment date and time.  Patient provided phone number [phone].  [facility].  Patient is thankful for assistance.  No further EDCM needs at this time.    07/13/2015 A.Rashaan Wyles RNCM 2100pm  [facility] at 10am with Dr. Julien NordmannLangeland.  [facility] and informed patient of appointment.

## 2015-07-15 LAB — URINE CULTURE

## 2015-07-17 ENCOUNTER — Ambulatory Visit (INDEPENDENT_AMBULATORY_CARE_PROVIDER_SITE_OTHER): Payer: Self-pay | Admitting: Family Medicine

## 2015-07-17 VITALS — BP 133/99 | HR 98 | Temp 98.4°F | Resp 16 | Ht 63.0 in | Wt 170.0 lb

## 2015-07-17 DIAGNOSIS — N200 Calculus of kidney: Secondary | ICD-10-CM

## 2015-07-17 DIAGNOSIS — R1031 Right lower quadrant pain: Secondary | ICD-10-CM

## 2015-07-17 DIAGNOSIS — N83201 Unspecified ovarian cyst, right side: Secondary | ICD-10-CM

## 2015-07-17 DIAGNOSIS — G8929 Other chronic pain: Secondary | ICD-10-CM

## 2015-07-17 MED ORDER — OXYCODONE-ACETAMINOPHEN 10-325 MG PO TABS
ORAL_TABLET | ORAL | Status: DC
Start: 2015-07-17 — End: 2015-08-17

## 2015-07-17 NOTE — Patient Instructions (Addendum)
Minimize the use of the Percocet 10 pain pills to make them last until you see the gynecologist. Take 1 every 6 or 8 hours for severe pain  Tylenol (acetaminophen) maximum of 3000 mg total in 24 hours. Your pain pills include 325 mg each pill.  Make sure you follow-up with your gynecologist in 2 weeks as scheduled  You can also take ibuprofen maximum of 800 mg 3 times daily (2400 mg daily) tonight    IF you received an x-ray today, you will receive an invoice from [facility]. Please contact [facility] at [phone] with questions or concerns regarding your invoice.   IF you received labwork today, you will receive an invoice from [facility]. Please contact [facility] at [phone] with questions or concerns regarding your invoice.   Our billing staff will not be able to assist you with questions regarding bills from these companies.  You will be contacted with the lab results as soon as they are available. The fastest way to get your results is to activate your My Chart account. Instructions are located on the last page of this paperwork. If you have not heard from us regarding the results in 2 weeks, please contact this office.

## 2015-07-17 NOTE — Progress Notes (Signed)
Patient ID: Shannon SnareMary Dawson, female    DOB: 1975/08/27  Age: 40 y.o. MRN: [id number]  Chief Complaint  Patient presents with  . Ovarian Cyst    x 5 months  . Nephrolithiasis    Subjective:   40 year old lady who comes in here with history of having had recent problems with kidney stones. She had lithotripsy, still has a small residual stone. She developed more pain in the right lower abdomen and back area in the last couple weeks. She has a history of a ovarian cyst. She was in the emergency room 4 days ago and had a scan done of her abdomen. It did show a 1 mm stone in the left kidney, and a 10 cm cyst of the right ovary, and a 2 cm renal cyst. She had a scan 6 years ago which did not show these lesions. She has continued to have a lot of pain since being in the emergency room. Most of the pain is in the right flank, right mid and lower abdomen. She was given Percocet 5/325 #15 in the emergency room, and his use them. She has an appointment with a gynecologist in 2 weeks. 3 days ago she had a 1 day menstrual.  Current allergies, medications, problem list, past/family and social histories reviewed.  Objective:  BP 133/99 mmHg  Pulse 98  Temp(Src) 98.4 F (36.9 C) (Oral)  Resp 16  Ht 5\' 3"  (1.6 m)  Wt 170 lb (77.111 kg)  BMI 30.12 kg/m2  SpO2 98%  LMP 07/10/2015 (Approximate)  Prior studies reviewed. She has moderate right CVA tenderness. She has moderate right upper quadrant abdominal tenderness. More severe right lower abdominal pain and probable area of the cyst is palpable on abdominal exam. This is tender.  Assessment & Plan:   Assessment: 1. Cyst of right ovary   2. Kidney stones   3. Abdominal pain, chronic, right lower quadrant       Plan: Abdominal pain, with ovarian cyst and tiny kidney stone possibly contributing. Of concern is fact the patient has a history of prior drug use. She had a major right knee injury a couple of years ago. She took Percocet 10/325 for a long  stretch of 4 months or so for the knee pain.  She is living with her father currently, not employed because of her knee injury. She says he manages her medicines and keep track of them. She attends in a and has a sponsor [facility] [place] She is reluctant to see a pain clinic because she does not want to be placed on more stuff.  No orders of the defined types were placed in this encounter.   We reviewed the database. I called the pharmacy and talked to them to be certain where the various prescriptions came from. Even though the DEA numbers didn't all say [facility], that is where her prescriptions have come from with low doses of pain medicines. She has not talked about the concern that she has had a good many pain pills over the last 6 weeks, and she needs to be cautious about the risk of getting back on to things. She realizes that. Her father helps her in the distribution of pain medicines I believe. I did decide to give her Percocet 10 times through until she can see her gynecologist, but will not continue prescribing them for her. She understands. I think she is sincere in trying to be honest with the medicines, but she  is at a moderately high risk so cannot be continued long-term. Meds ordered this encounter  Medications  . oxyCODONE-acetaminophen (PERCOCET) 10-325 MG tablet    Sig: Take one every 6-8 hours only when needed for severe pain    Dispense:  50 tablet    Refill:  0         Patient Instructions   Minimize the use of the Percocet 10 pain pills to make them last until you see the gynecologist. Take 1 every 6 or 8 hours for severe pain  Tylenol (acetaminophen) maximum of 3000 mg total in 24 hours. Your pain pills include 325 mg each pill.  Make sure you follow-up with your gynecologist in 2 weeks as scheduled  You can also take ibuprofen maximum of 800 mg 3 times daily (2400 mg daily) tonight    IF you received an x-ray today, you  will receive an invoice from [facility]. Please contact [facility] at [phone] with questions or concerns regarding your invoice.   IF you received labwork today, you will receive an invoice from [facility]. Please contact [facility] at [phone] with questions or concerns regarding your invoice.   Our billing staff will not be able to assist you with questions regarding bills from these companies.  You will be contacted with the lab results as soon as they are available. The fastest way to get your results is to activate your My Chart account. Instructions are located on the last page of this paperwork. If you have not heard from [place] regarding the results in 2 weeks, please contact this office.         No Follow-up on file.   HOPPER,DAVID, MD 07/17/2015

## 2015-07-24 ENCOUNTER — Inpatient Hospital Stay: Payer: Self-pay | Admitting: Internal Medicine

## 2015-07-26 ENCOUNTER — Encounter: Payer: Self-pay | Admitting: Obstetrics & Gynecology

## 2015-07-26 ENCOUNTER — Ambulatory Visit (INDEPENDENT_AMBULATORY_CARE_PROVIDER_SITE_OTHER): Payer: Self-pay | Admitting: Obstetrics & Gynecology

## 2015-07-26 VITALS — BP 134/85 | HR 82 | Ht 63.0 in | Wt 166.0 lb

## 2015-07-26 DIAGNOSIS — R1900 Intra-abdominal and pelvic swelling, mass and lump, unspecified site: Secondary | ICD-10-CM

## 2015-07-26 DIAGNOSIS — N911 Secondary amenorrhea: Secondary | ICD-10-CM

## 2015-07-26 NOTE — Progress Notes (Signed)
Patient ID: Shannon Dawson, female   DOB: August 25, 1975, 40 y.o.   MRN: [id number] History:  40 y.o. [id number]  LMP: Pt reports irreg cycles.  Had menese 5 months ago and then 1 month prev had 1 days of bleeding.  Ov cyst dx/d Dec 2016 in [place], [place] at [facility] hosp.  Pt has had pain since then . Pt reports that it was prev: Left 3.5cm and on the right 8cm.  This was in internal [place]- per pt.  I HAVE NO OFFICIAL REPORTS.  Pt c/o pain on a daily basis requiring meds q 6-8 hrs.  Pain mostly in the back.  Pt with a /o kidney stones but, feels that this pain is different. She has been straining her urine with no stones being passed. She is followed by urology.  Pt adopted so does not know her family history.       The following portions of the patient's history were reviewed and updated as appropriate: allergies, current medications, past family history, past medical history, past social history, past surgical history and problem list.  Review of Systems:  Pertinent items are noted in HPI.  Objective:  Physical Exam Blood pressure 134/85, pulse 82, height  (1.6 m), weight 166 lb (75.297 kg), last menstrual period 07/19/2015. Gen: NAD Abd: Soft, nontender and nondistended Pelvic: Normal appearing external genitalia; normal appearing vaginal mucosa and cervix.  Normal discharge.  Small uterus, no other palpable masses, no uterine or adnexal tenderness  Labs and Imaging  05/18/2015 CLINICAL DATA: Acute onset right lower quadrant abdominal pain, radiating to the back. Fever and chills. Initial encounter.  EXAM: CT ABDOMEN AND PELVIS WITH CONTRAST  TECHNIQUE: Multidetector CT imaging of the abdomen and pelvis was performed using the standard protocol following bolus administration of intravenous contrast.  CONTRAST: OMNIPAQUE IOHEXOL 300 MG/ML SOLN  COMPARISON: CT of the chest, abdomen and pelvis performed 07/18/2009  FINDINGS: Mild bibasilar atelectasis is noted.  Trace pericardial fluid remains within normal limits.  The liver and spleen are unremarkable in appearance. The patient is status post cholecystectomy, with clips noted at the gallbladder fossa. The pancreas and adrenal glands are unremarkable.  There is mild-to-moderate right-sided hydronephrosis, with an obstructing 7 x 7 mm stone noted at the proximal right ureter, just below the right renal pelvis. A nonobstructing 7 mm stone is noted at the lower pole of the right kidney. Additional smaller nonobstructing left renal stones are seen.  There is a somewhat complex mildly hypoattenuating mass at the interpole region of the right kidney, measuring 2.3 cm. This is difficult to fully assess, due to poor enhancement of the right kidney secondary to the patient's hydronephrosis.  No free fluid is identified. The small bowel is unremarkable in appearance. The stomach is within normal limits. No acute vascular abnormalities are seen. Minimal calcification is noted at the distal abdominal aorta. A retroaortic left renal vein is noted.  The appendix is normal in caliber, without evidence of appendicitis. The colon is unremarkable in appearance.  The bladder is mildly distended and grossly unremarkable. A large right adnexal cystic lesion is noted, measuring 10.6 x 9.6 x 8.7 cm. The left ovary is unremarkable in appearance. The uterus is grossly unremarkable, though the cervix is difficult to fully assess. No inguinal lymphadenopathy is seen.  No acute osseous abnormalities are identified.  IMPRESSION: 1. Mild to moderate right-sided hydronephrosis, with an obstructing 7 x 7 mm stone at the proximal right ureter, just below the right renal pelvis.  2. Somewhat complex mildly hypoattenuating mass at the interpole region of the right kidney, measuring 2.3 cm. This is difficult to fully assess, due to poor enhancement of the right kidney secondary to the patient's hydronephrosis.  MRI of the abdomen is recommended for further evaluation, to exclude malignancy; ultrasound could be considered, but may not provide sufficient confirmation in this case. 3. Large right adnexal cystic lesion, measuring 10.6 x 9.6 x 8.7 cm. This could be further assessed on pelvic ultrasound, or if the patient is undergoing abdominal MRI, concurrent pelvic MRI could be considered. 4. Nonobstructing bilateral renal stones, measuring up to 7 mm in size. 5. Mild bibasilar atelectasis noted.  Ct Renal Stone Study  07/13/2015  CLINICAL DATA:  Lithotripsy late March 2017, stents have been removed, recently treated for urinary tract infection, reporting flank pain for 4 days EXAM: CT ABDOMEN AND PELVIS WITHOUT CONTRAST TECHNIQUE: Multidetector CT imaging of the abdomen and pelvis was performed following the standard protocol without IV contrast. COMPARISON:  05/18/2015 FINDINGS: Lower chest:  Visualized portions of the lung bases clear Hepatobiliary: Status post cholecystectomy.  Liver normal. Pancreas: Normal pancreas Spleen: Spleen is normal Adrenals/Urinary Tract: Adrenal glands are normal. No renal calculi on either side except for 1 mm stone midpole left kidney. 2 cm low-attenuation lesion upper pole right kidney stable. No ureteral stones. No hydronephrosis. No perinephric inflammation. Bladder is normal. Stomach/Bowel: Nonobstructive bowel gas pattern. Stool throughout the colon. Appendix is normal. Small bowel is normal. Vascular/Lymphatic: Retro aortic left renal vein. Mild atherosclerotic calcification of the aortoiliac vessels somewhat prominent for patient age. No significant adenopathy. Reproductive: Uterus and left ovary appear normal. 10 cm cystic right adnexal mass again identified similar to prior study. Other: No significant free fluid Musculoskeletal: No acute osseous abnormalities IMPRESSION: 1. Stable large cystic right adnexal mass. Further evaluation with MRI or surgical consultation  recommended. This recommendation follows ACR consensus guidelines: White Paper of the ACR Incidental Findings Committee II on Adnexal Findings. J Am Coll Radiol [id number]. 2. Known complex cystic lesion right ovary. Malignancy not excluded. As previously described in report of 05/18/2015 study, renal MRI recommended. 3. 1 mm nonobstructing stone midpole left kidney. No hydronephrosis or perinephric inflammation. Electronically Signed   By: Esperanza Heiraymond  Rubner M.D.   On: 07/13/2015 17:33    Assessment & Plan:  Right adnexal mass- suspect dermoid or endometrioma.  Stable from 05/19/2015 to 07/13/2015  Pt requested pain meds today.  Pt received #50 percocet 07/17/2015 from Dr. Alwyn RenHopper- no narcs given explained to pt risks of abuse etc.  Request for pain meds denied.   Labs: CA125  [facility]  Will schedule laparotomy for right oophorectomy on [place] pending labs and US.  Have explained to pt that it is important for her to f/u for labs and review of US  Pt is to f/u in 2 weeks.  Pt is to complete her financial aid paperwork.    Tabia Landowski L. Harraway-Smith, M.D., [facility]AC[facility]

## 2015-07-27 ENCOUNTER — Encounter (HOSPITAL_COMMUNITY): Payer: Self-pay | Admitting: *Deleted

## 2015-07-27 LAB — CA 125: CA 125: 18 U/mL (ref <35)

## 2015-07-27 LAB — FOLLICLE STIMULATING HORMONE: FSH: 29.2 m[IU]/mL

## 2015-07-31 ENCOUNTER — Encounter: Payer: Self-pay | Admitting: *Deleted

## 2015-08-01 ENCOUNTER — Ambulatory Visit (HOSPITAL_COMMUNITY)
Admission: RE | Admit: 2015-08-01 | Discharge: 2015-08-01 | Disposition: A | Discharge status: Home / Self Care | Payer: Self-pay | Source: Ambulatory Visit | Attending: Obstetrics & Gynecology | Admitting: Obstetrics & Gynecology

## 2015-08-01 DIAGNOSIS — R1900 Intra-abdominal and pelvic swelling, mass and lump, unspecified site: Secondary | ICD-10-CM

## 2015-08-01 DIAGNOSIS — N83201 Unspecified ovarian cyst, right side: Secondary | ICD-10-CM | POA: Insufficient documentation

## 2015-08-07 ENCOUNTER — Telehealth: Payer: Self-pay | Admitting: Obstetrics & Gynecology

## 2015-08-07 NOTE — Telephone Encounter (Signed)
Calling to get pain meds.

## 2015-08-09 ENCOUNTER — Other Ambulatory Visit (HOSPITAL_COMMUNITY): Payer: Self-pay

## 2015-08-09 NOTE — Telephone Encounter (Signed)
Please notify pt that I am unable to continue to fill her pain medication. She has not completed her paperwork as requested.  I explained this to her at her last visit.  Thx, clh-S

## 2015-08-09 NOTE — Telephone Encounter (Signed)
Patient called and left message stating she called yesterday but hasn't heard back from anyone yet. Patient states she needs a prescription for pain. Patient states she has been out for a couple days now and also needs a Rx for nausea medication.

## 2015-08-09 NOTE — Telephone Encounter (Signed)
Pt left additional message @ 1150 today stating that she is out of pain medication for her cyst and requests refill. Message sent to Dr. Erin FullingHarraway-Smith.

## 2015-08-10 NOTE — Telephone Encounter (Signed)
Called patient, no answer- left message stating we are trying to reach you to return your phone call, please call us back at the clinics 

## 2015-08-15 NOTE — Telephone Encounter (Signed)
Patient called 5/26 @345pm  and left message stating she is returning our call. States she has been out of pain medication for a couple days now. Called patient and asked if she completed her financial application paperwork yet. Patient states it is mostly filled out and was going to bring it in this week. Told patient Dr Erin FullingHarraway Smith would not be refilling the percocet. Patient states she has never got a refill. Told patient she isn't refilling the Rx she got on 5/1. Phone call was line disconnected. Called patient back but there was no answer

## 2015-08-17 ENCOUNTER — Ambulatory Visit (INDEPENDENT_AMBULATORY_CARE_PROVIDER_SITE_OTHER): Payer: Self-pay | Admitting: Obstetrics & Gynecology

## 2015-08-17 ENCOUNTER — Ambulatory Visit (INDEPENDENT_AMBULATORY_CARE_PROVIDER_SITE_OTHER): Payer: Self-pay | Admitting: Physician Assistant

## 2015-08-17 ENCOUNTER — Telehealth: Payer: Self-pay | Admitting: Physician Assistant

## 2015-08-17 VITALS — BP 142/95 | HR 92 | Wt 168.5 lb

## 2015-08-17 VITALS — BP 136/84 | HR 113 | Temp 98.1°F | Resp 16 | Ht 63.0 in | Wt 168.0 lb

## 2015-08-17 DIAGNOSIS — N83201 Unspecified ovarian cyst, right side: Secondary | ICD-10-CM

## 2015-08-17 DIAGNOSIS — G8929 Other chronic pain: Secondary | ICD-10-CM

## 2015-08-17 DIAGNOSIS — R1031 Right lower quadrant pain: Secondary | ICD-10-CM

## 2015-08-17 MED ORDER — OXYCODONE-ACETAMINOPHEN 10-325 MG PO TABS: 1.0000 | ORAL_TABLET | Freq: Two times a day (BID) | ORAL | Status: DC | PRN

## 2015-08-17 MED ORDER — DICLOFENAC POTASSIUM 50 MG PO TABS: 50.0000 mg | ORAL_TABLET | Freq: Three times a day (TID) | ORAL | Status: DC

## 2015-08-17 NOTE — Telephone Encounter (Signed)
Left message asking that on-call provider be paged.  Patient states that surgery was delayed due to financial assistance delay. Surgery set for 08/21/15. Was seen today by OBGYN. Prescribed diclofenac. Note not yet complete. Unusual for patient to be advised to seek pain management from us for this, as she was just seen. Suspect that patient was not advised of such.  NCCSR reviewed. 5/01 Percocet 10/325 #50, Dr. Alwyn RenHopper (here) 4/28 Percocet 5/325 #15, Dr. Dahlia ClientBrowning (ED) 4/19 Percocet 5/325 #10 , [facility] 4/05 Percocet 10/325 #30, Dr. Ronne BinningMcKenzie ([facility]) 3/27 Percocet 10/325 #30, Dr. Ronne BinningMcKenzie 3/17 Percocet 5/325 #20, Dr. Ranae PalmsYelverton (ED) 3/11 Percocet 5/325 #20, Dr. Fayrene FearingJames ()

## 2015-08-17 NOTE — Patient Instructions (Addendum)
Please take the diclofenac that Dr. Erin FullingHarraway-Smith prescribed you. It may reduce the amount of the Percocet that you need. Take the diclofenac with food to reduce any GI symptoms.  I have prescribed enough pain medication to last until your surgery scheduled on 08/28/2015. Any consideration of additional pain medication from this office for this problem will require a conversation with Dr. Erin FullingHarraway-Smith. We are concerned for your health and the risks that prolonged use of opioids can cause.    IF you received an x-ray today, you will receive an invoice from [facility]. Please contact [facility] at [phone] with questions or concerns regarding your invoice.   IF you received labwork today, you will receive an invoice from [facility]. Please contact [facility] at [phone] with questions or concerns regarding your invoice.   Our billing staff will not be able to assist you with questions regarding bills from these companies.  You will be contacted with the lab results as soon as they are available. The fastest way to get your results is to activate your My Chart account. Instructions are located on the last page of this paperwork. If you have not heard from us regarding the results in 2 weeks, please contact this office.

## 2015-08-17 NOTE — Patient Instructions (Signed)
Ovarian Cyst An ovarian cyst is a fluid-filled sac that forms on an ovary. The ovaries are small organs that produce eggs in women. Various types of cysts can form on the ovaries. Most are not cancerous. Many do not cause problems, and they often go away on their own. Some may cause symptoms and require treatment. Common types of ovarian cysts include:  Functional cysts--These cysts may occur every month during the menstrual cycle. This is normal. The cysts usually go away with the next menstrual cycle if the woman does not get pregnant. Usually, there are no symptoms with a functional cyst.  Endometrioma cysts--These cysts form from the tissue that lines the uterus. They are also called "chocolate cysts" because they become filled with blood that turns brown. This type of cyst can cause pain in the lower abdomen during intercourse and with your menstrual period.  Cystadenoma cysts--This type develops from the cells on the outside of the ovary. These cysts can get very big and cause lower abdomen pain and pain with intercourse. This type of cyst can twist on itself, cut off its blood supply, and cause severe pain. It can also easily rupture and cause a lot of pain.  Dermoid cysts--This type of cyst is sometimes found in both ovaries. These cysts may contain different kinds of body tissue, such as skin, teeth, hair, or cartilage. They usually do not cause symptoms unless they get very big.  Theca lutein cysts--These cysts occur when too much of a certain hormone (human chorionic gonadotropin) is produced and overstimulates the ovaries to produce an egg. This is most common after procedures used to assist with the conception of a baby (in vitro fertilization). CAUSES   Fertility drugs can cause a condition in which multiple large cysts are formed on the ovaries. This is called ovarian hyperstimulation syndrome.  A condition called polycystic ovary syndrome can cause hormonal imbalances that can lead to  nonfunctional ovarian cysts. SIGNS AND SYMPTOMS  Many ovarian cysts do not cause symptoms. If symptoms are present, they may include:  Pelvic pain or pressure.  Pain in the lower abdomen.  Pain during sexual intercourse.  Increasing girth (swelling) of the abdomen.  Abnormal menstrual periods.  Increasing pain with menstrual periods.  Stopping having menstrual periods without being pregnant. DIAGNOSIS  These cysts are commonly found during a routine or annual pelvic exam. Tests may be ordered to find out more about the cyst. These tests may include:  Ultrasound.  X-ray of the pelvis.  CT scan.  MRI.  Blood tests. TREATMENT  Many ovarian cysts go away on their own without treatment. Your health care provider may want to check your cyst regularly for 2-3 months to see if it changes. For women in menopause, it is particularly important to monitor a cyst closely because of the higher rate of ovarian cancer in menopausal women. When treatment is needed, it may include any of the following:  A procedure to drain the cyst (aspiration). This may be done using a long needle and ultrasound. It can also be done through a laparoscopic procedure. This involves using a thin, lighted tube with a tiny camera on the end (laparoscope) inserted through a small incision.  Surgery to remove the whole cyst. This may be done using laparoscopic surgery or an open surgery involving a larger incision in the lower abdomen.  Hormone treatment or birth control pills. These methods are sometimes used to help dissolve a cyst. HOME CARE INSTRUCTIONS   Only take over-the-counter   or prescription medicines as directed by your health care provider.  Follow up with your health care provider as directed.  Get regular pelvic exams and Pap tests. SEEK MEDICAL CARE IF:   Your periods are late, irregular, or painful, or they stop.  Your pelvic pain or abdominal pain does not go away.  Your abdomen becomes  larger or swollen.  You have pressure on your bladder or trouble emptying your bladder completely.  You have pain during sexual intercourse.  You have feelings of fullness, pressure, or discomfort in your stomach.  You lose weight for no apparent reason.  You feel generally ill.  You become constipated.  You lose your appetite.  You develop acne.  You have an increase in body and facial hair.  You are gaining weight, without changing your exercise and eating habits.  You think you are pregnant. SEEK IMMEDIATE MEDICAL CARE IF:   You have increasing abdominal pain.  You feel sick to your stomach (nauseous), and you throw up (vomit).  You develop a fever that comes on suddenly.  You have abdominal pain during a bowel movement.  Your menstrual periods become heavier than usual. MAKE SURE YOU:  Understand these instructions.  Will watch your condition.  Will get help right away if you are not doing well or get worse.   This information is not intended to replace advice given to you by your health care provider. Make sure you discuss any questions you have with your health care provider.   Document Released: 03/04/2005 Document Revised: 03/09/2013 Document Reviewed: 11/09/2012 Elsevier Interactive Patient Education 2016 Elsevier Inc.  

## 2015-08-17 NOTE — Progress Notes (Signed)
Patient ID: [name], female   DOB: Feb 27, 1976, 40 y.o.   MRN: [id number] History:  40 y.o. No obstetric history on file. here today for f/u of right ov cyst.  Pt did not complete paperwork that was to be completed.  She is requesting additional pain medication.  She denies change in clinical sx.  The following portions of the patient's history were reviewed and updated as appropriate: allergies, current medications, past family history, past medical history, past social history, past surgical history and problem list.  Review of Systems:  Pertinent items are noted in HPI.  Objective:  Physical Exam Blood pressure 142/95, pulse 92, weight 168 lb 8 oz (76.431 kg), last menstrual period 07/19/2015. Gen: NAD Exam not repeated  Labs and Imaging [place] Transvaginal Non-ob  08/01/2015  CLINICAL DATA:  Adnexal mass EXAM: TRANSABDOMINAL AND TRANSVAGINAL ULTRASOUND OF PELVIS TECHNIQUE: Both transabdominal and transvaginal ultrasound examinations of the pelvis were performed. Transabdominal technique was performed for global imaging of the pelvis including uterus, ovaries, adnexal regions, and pelvic cul-de-sac. It was necessary to proceed with endovaginal exam following the transabdominal exam to visualize the uterus and ovaries. COMPARISON:  CT 07/13/2015, 05/18/2015. FINDINGS: Uterus Measurements: 7.6 x 4.1 x 5.7 cm. No fibroids or other mass visualized. Endometrium Thickness: 9.0 mm.  No focal abnormality visualized. Right ovary Measurements: 11.4 x 10.6 x 10.7 cm. 10.4 x 8.8 x 9.6 cm septated cystic mass again noted. Similar finding noted on prior exams. Left ovary Measurements: 3.2 x 1.6 x 1.7 cm. Normal appearance/no adnexal mass. Other findings No abnormal free fluid. IMPRESSION: 10.4 x 8.8 x 9.6 cm septated cystic mass right ovary/ right adnexa. Similar findings noted on prior CTs of 07/13/2015 and 05/18/2015. As noted on prior CT report further evaluation with MRI or surgery is suggested.  Electronically Signed   By: Maisie Fushomas  Register   On: 08/01/2015 16:36   [place] Pelvis Complete  08/01/2015  CLINICAL DATA:  Adnexal mass EXAM: TRANSABDOMINAL AND TRANSVAGINAL ULTRASOUND OF PELVIS TECHNIQUE: Both transabdominal and transvaginal ultrasound examinations of the pelvis were performed. Transabdominal technique was performed for global imaging of the pelvis including uterus, ovaries, adnexal regions, and pelvic cul-de-sac. It was necessary to proceed with endovaginal exam following the transabdominal exam to visualize the uterus and ovaries. COMPARISON:  CT 07/13/2015, 05/18/2015. FINDINGS: Uterus Measurements: 7.6 x 4.1 x 5.7 cm. No fibroids or other mass visualized. Endometrium Thickness: 9.0 mm.  No focal abnormality visualized. Right ovary Measurements: 11.4 x 10.6 x 10.7 cm. 10.4 x 8.8 x 9.6 cm septated cystic mass again noted. Similar finding noted on prior exams. Left ovary Measurements: 3.2 x 1.6 x 1.7 cm. Normal appearance/no adnexal mass. Other findings No abnormal free fluid. IMPRESSION: 10.4 x 8.8 x 9.6 cm septated cystic mass right ovary/ right adnexa. Similar findings noted on prior CTs of 07/13/2015 and 05/18/2015. As noted on prior CT report further evaluation with MRI or surgery is suggested. Electronically Signed   By: Maisie Fushomas  Register   On: 08/01/2015 16:36    Assessment & Plan:  Right adnexal mass  Pt to be scheduled for surgery Suspected narcotic abuse. Pt informed that we will NOT prescribe her any additional pain meds until after her surgery as she reported no Rx for narcs 2 days after she received a prescription  2 days prev. Pt has received Rx for narcs (Percocet)  3/11; 3/16/ 3/27 (2 separate prescription for #30 tables); 4/27, 5/1 and 6/1  Pt to complete financial aid paperwork.  She reports  that she will turn it in today  Patient desires surgical management with exp laparotomy or right adnexal mass.  The risks of surgery were discussed in detail with the patient  including but not limited to: bleeding which may require transfusion or reoperation; infection which may require prolonged hospitalization or re-hospitalization and antibiotic therapy; injury to bowel, bladder, ureters and major vessels or other surrounding organs; need for additional procedures including laparotomy; thromboembolic phenomenon, incisional problems and other postoperative or anesthesia complications.  Patient was told that the likelihood that her condition and symptoms will be treated effectively with this surgical management was very high; the postoperative expectations were also discussed in detail. The patient also understands the alternative treatment options which were discussed in full. All questions were answered.  She was told that she will be contacted by our surgical scheduler regarding the time and date of her surgery; routine preoperative instructions of having nothing to eat or drink after midnight on the day prior to surgery and also coming to the hospital 1 1/2 hours prior to her time of surgery were also emphasized.  She was told she may be called for a preoperative appointment about a week prior to surgery and will be given further preoperative instructions at that visit. Printed patient education handouts about the procedure were given to the patient to review at home.    Jilda Kress L. Harraway-Smith, M.D., [facility]

## 2015-08-17 NOTE — Progress Notes (Signed)
Pt given [facility] financial application.  Pt stated that she will bring in requested information before closing today.  She also stated that she understands the importance so that she can get her surgery.  I advised pt that if she has any questions to please give the office a call.  Pt stated understanding with no further questions.

## 2015-08-17 NOTE — Progress Notes (Signed)
Subjective:    Patient ID: Shannon SnareMary Friis, female    DOB: Oct 25, 1975, 40 y.o.   MRN: [id number] Chief Complaint  Patient presents with  . Ovarian Cyst    painful   HPI  Patient presents today with 8/10 RLQ pain due to an ovarian cyst that is pushing on her uterus.    Patient states she as surgery scheduled for next week but it has been postponed because she was unable to turn her financial aid in on time.  "My OBGYN told me to go to my primary care to get pain meds, but I don't really have one so I came here because Dr. Alwyn RenHopper some pain meds last time I was here so I could get through til my surgery."    Patient was diagnosis with kidney stones and an ovarian cyst by US.  She states she had surgery last month that removed 14 stones and "blasted" 2 stones.  She is still experiecing occasional back pain and some pain with urination.  Denies fever, dizziness, change in bowel habits, increase in pain level or diffuse pain.   08/01/15: Trans-vaginal US IMPRESSION: 10.4 x 8.8 x 9.6 cm septated cystic mass right ovary/ right adnexa. Similar findings noted on prior CTs of 07/13/2015 and 05/18/2015. As noted on prior CT report further evaluation with MRI or surgery is suggested.   Review of Systems All other systems reviewed are negative.    Objective: BP 136/84 mmHg  Pulse 113  Temp(Src) 98.1 F (36.7 C)  Resp 16  Ht 5\' 3"  (1.6 m)  Wt 168 lb (76.204 kg)  BMI 29.77 kg/m2  SpO2 100%  LMP 08/12/2015 (Approximate)   Physical Exam  Constitutional: She appears well-developed and well-nourished.  Non-toxic appearance. She does not have a sickly appearance. She does not appear ill. No distress.  HENT:  Head: Normocephalic.  Cardiovascular: Regular rhythm, normal heart sounds and intact distal pulses.  Tachycardia present.   Pulmonary/Chest: Effort normal and breath sounds normal.  Abdominal: Normal appearance. There is tenderness in the right lower quadrant. There is no rigidity, no  rebound and no guarding.      Assessment & Plan:  1. Cyst of right ovary -Recent TVUS reveals a large mass on the right ovary.   -EMR indicates patient is being truthful about her surgery postponement for 1 additional week.   -Patient appears sincere in her pain and commitment to abstain from narcotics abuse.  Prescribed Perococet 10-325 BID PRN for pain, a supply long enough to last until her surgery on 08/29/15.  Advised patient on the importance of taking medication as prescribed, and avoiding prolonged use to avoid risks and effects on health caused by long term use. >> NCCSR was reviewed and indicates no other narcotics have been written following her last visit here 07/17/2015 -recommended she should also take diclofenac prescribed by GYN, advised it may reduce the amount of percocet she may need, but should be taken with food to reduce possible GI symptoms. - oxyCODONE-acetaminophen (PERCOCET) 10-325 MG tablet; Take 1 tablet by mouth 2 (two) times daily as needed for pain.  Dispense: 22 tablet; Refill: 0  2. Abdominal pain, chronic, right lower quadrant Pain due to large cyst and resolution of recent kidney stones.  Patient advised to follow-up with PCP or urologist if urinary symptoms or flank pain worsen.   - oxyCODONE-acetaminophen (PERCOCET) 10-325 MG tablet; Take 1 tablet by mouth 2 (two) times daily as needed for pain.  Dispense: 22 tablet; Refill: 0  Sho Salguero P. Asha Grumbine, PA-S

## 2015-08-17 NOTE — Progress Notes (Signed)
Patient ID: Shannon Dawson, female    DOB: 1975/10/29, 40 y.o.   MRN: [id number]  PCP: No primary care provider on file.  Subjective:   Chief Complaint  Patient presents with  . Ovarian Cyst    painful    HPI Patient presents today with 8/10 RLQ pain due to an ovarian cyst that is "pushing on my uterus."   Patient states she had surgery scheduled for next week but it has been postponed because she was unable to turn her financial aid in on time. "My OBGYN told me to go to my primary care to get pain meds, but I don't really have one so I came here because Dr. Alwyn Ren gave me some pain meds last time I was here so I could get through 'til my surgery."   Patient was diagnosis with kidney stones and an ovarian cyst by [place]. She states she had surgery last month that removed 14 stones and "blasted" 2 stones. She is still experiecing occasional back pain and some pain with urination.  Denies fever, dizziness, change in bowel habits, increase in pain level or diffuse pain.   Trans-vaginal [place] 08/01/15 IMPRESSION: 10.4 x 8.8 x 9.6 cm septated cystic mass right ovary/ right adnexa. Similar findings noted on prior CTs of 07/13/2015 and 05/18/2015. As noted on prior CT report further evaluation with MRI or surgery is Suggested.  Chart reviewed. Surgery date is not yet updated. I was initially unable to get in touch with her GYN. Apparently, she did return the page, but then my phone did not ring and the call did not appear on my phone until after the patient left. Dr. Alwyn Ren did review the concerns regarding opiates with her, especially given her history and increased risk of dependence. He, and I again, reviewed the NCCSR, and there is no apparent "doctor shopping."    Review of Systems As above.    Patient Active Problem List   Diagnosis Date Noted  . Nephrolithiasis 05/19/2015  . OCD (obsessive compulsive disorder) 09/05/2011  . Bipolar disorder (HCC) 09/05/2011  . Generalized  anxiety disorder 09/05/2011  . Opioid dependence (HCC) 08/29/2011  . Alcohol dependence (HCC) 08/29/2011     Prior to Admission medications   Medication Sig Start Date End Date Taking? Authorizing Provider  acetaminophen (TYLENOL) 500 MG tablet Take 500 mg by mouth every 8 (eight) hours as needed for moderate pain. Reported on 07/26/2015   Yes Historical Provider, MD  diclofenac (CATAFLAM) 50 MG tablet Take 1 tablet (50 mg total) by mouth 3 (three) times daily. Patient not taking: Reported on 08/17/2015 08/17/15   Willodean Rosenthal, MD  oxyCODONE-acetaminophen (PERCOCET) 10-325 MG tablet Take one every 6-8 hours only when needed for severe pain Patient not taking: Reported on 08/17/2015 07/17/15   Peyton Najjar, MD     Allergies  Allergen Reactions  . Tramadol Itching    Severe  . Vicodin [Hydrocodone-Acetaminophen] Itching    Itching is severe.        Objective:  Physical Exam  Constitutional: She is oriented to person, place, and time. She appears well-developed and well-nourished. She is active and cooperative. No distress.  BP 136/84 mmHg  Pulse 113  Temp(Src) 98.1 F (36.7 C)  Resp 16  Ht  (1.6 m)  Wt 168 lb (76.204 kg)  BMI 29.77 kg/m2  SpO2 100%  LMP 08/12/2015 (Approximate)  HENT:  Head: Normocephalic and atraumatic.  Right Ear: Hearing normal.  Left Ear: Hearing normal.  Eyes: Conjunctivae  are normal. No scleral icterus.  Neck: Normal range of motion. Neck supple. No thyromegaly present.  Cardiovascular: Regular rhythm and normal heart sounds.  Tachycardia present.   Pulses:      Radial pulses are 2+ on the right side, and 2+ on the left side.  Pulmonary/Chest: Effort normal and breath sounds normal.  Abdominal: Normal appearance and bowel sounds are normal. There is no hepatosplenomegaly. There is tenderness in the right lower quadrant. There is no rigidity, no rebound and no guarding.  Lymphadenopathy:       Head (right side): No tonsillar, no  preauricular, no posterior auricular and no occipital adenopathy present.       Head (left side): No tonsillar, no preauricular, no posterior auricular and no occipital adenopathy present.    She has no cervical adenopathy.       Right: No supraclavicular adenopathy present.       Left: No supraclavicular adenopathy present.  Neurological: She is alert and oriented to person, place, and time. No sensory deficit.  Skin: Skin is warm, dry and intact. No rash noted. No cyanosis or erythema. Nails show no clubbing.  Psychiatric: She has a normal mood and affect. Her speech is normal and behavior is normal.           Assessment & Plan:   1. Cyst of right ovary 2. Abdominal pain, chronic, right lower quadrant Again discussed the concerns of opiate use beyond the very short term. However, she has not sought pain medication from other offices and appears sincere in her need for pain relief and commitment to discontinue opiates upon resolution of the issue following surgery. Encouraged her to also take the diclofenac prescribed by GYN, as it may reduce the amount of opiate she needs to control her pain. - oxyCODONE-acetaminophen (PERCOCET) 10-325 MG tablet; Take 1 tablet by mouth 2 (two) times daily as needed for pain.  Dispense: 22 tablet; Refill: 0    Fernande Brashelle S. Chevella Pearce, PA-C Physician Assistant-Certified [facility]

## 2015-08-18 ENCOUNTER — Encounter (HOSPITAL_COMMUNITY): Payer: Self-pay | Admitting: *Deleted

## 2015-08-29 ENCOUNTER — Encounter (HOSPITAL_COMMUNITY): Admission: RE | Payer: Self-pay | Source: Ambulatory Visit

## 2015-08-29 SURGERY — LAPAROTOMY
Anesthesia: General | Laterality: Right

## 2015-09-07 ENCOUNTER — Ambulatory Visit (HOSPITAL_COMMUNITY): Admission: RE | Admit: 2015-09-07 | Payer: Self-pay | Source: Ambulatory Visit | Admitting: Obstetrics & Gynecology

## 2015-09-07 ENCOUNTER — Telehealth: Payer: Self-pay | Admitting: General Practice

## 2015-09-07 NOTE — Telephone Encounter (Signed)
Telephone call to patient inquiring about charity care application completion. Surgery is on hold until paperwork is completed. Patient states she filled it out but was told additional information was needed. Patient states she is coming today at 330 to drop those things off. Patient had no questions

## 2015-09-21 ENCOUNTER — Emergency Department (HOSPITAL_COMMUNITY)
Admission: EM | Admit: 2015-09-21 | Discharge: 2015-09-22 | Disposition: A | Discharge status: Home / Self Care | Payer: Self-pay | Attending: Emergency Medicine | Admitting: Emergency Medicine

## 2015-09-21 ENCOUNTER — Encounter (HOSPITAL_COMMUNITY): Payer: Self-pay | Admitting: Nurse Practitioner

## 2015-09-21 ENCOUNTER — Emergency Department (HOSPITAL_COMMUNITY): Payer: Self-pay

## 2015-09-21 DIAGNOSIS — R102 Pelvic and perineal pain unspecified side: Secondary | ICD-10-CM

## 2015-09-21 DIAGNOSIS — G8929 Other chronic pain: Secondary | ICD-10-CM

## 2015-09-21 DIAGNOSIS — N838 Other noninflammatory disorders of ovary, fallopian tube and broad ligament: Secondary | ICD-10-CM

## 2015-09-21 DIAGNOSIS — N83201 Unspecified ovarian cyst, right side: Secondary | ICD-10-CM

## 2015-09-21 DIAGNOSIS — R1031 Right lower quadrant pain: Secondary | ICD-10-CM

## 2015-09-21 DIAGNOSIS — F17210 Nicotine dependence, cigarettes, uncomplicated: Secondary | ICD-10-CM | POA: Insufficient documentation

## 2015-09-21 LAB — COMPREHENSIVE METABOLIC PANEL
ALK PHOS: 75 U/L (ref 38–126)
ALT: 11 U/L — AB (ref 14–54)
AST: 16 U/L (ref 15–41)
Albumin: 3.4 g/dL — ABNORMAL LOW (ref 3.5–5.0)
Anion gap: 8 (ref 5–15)
BILIRUBIN TOTAL: 0.2 mg/dL — AB (ref 0.3–1.2)
BUN: 7 mg/dL (ref 6–20)
CALCIUM: 9.5 mg/dL (ref 8.9–10.3)
CO2: 25 mmol/L (ref 22–32)
CREATININE: 0.58 mg/dL (ref 0.44–1.00)
Chloride: 102 mmol/L (ref 101–111)
GFR calc non Af Amer: >60 mL/min (ref >60)
Glucose, Bld: 125 mg/dL — ABNORMAL HIGH (ref 65–99)
Potassium: 3.6 mmol/L (ref 3.5–5.1)
Sodium: 135 mmol/L (ref 135–145)
Total Protein: 6.9 g/dL (ref 6.5–8.1)

## 2015-09-21 LAB — CBC
HCT: 38.7 % (ref 36.0–46.0)
Hemoglobin: 13 g/dL (ref 12.0–15.0)
MCH: 30.7 pg (ref 26.0–34.0)
MCHC: 33.6 g/dL (ref 30.0–36.0)
MCV: 91.3 fL (ref 78.0–100.0)
PLATELETS: 270 10*3/uL (ref 150–400)
RBC: 4.24 MIL/uL (ref 3.87–5.11)
RDW: 13.8 % (ref 11.5–15.5)
WBC: 4.1 10*3/uL (ref 4.0–10.5)

## 2015-09-21 LAB — URINALYSIS, ROUTINE W REFLEX MICROSCOPIC
Bilirubin Urine: NEGATIVE
Glucose, UA: NEGATIVE mg/dL
KETONES UR: NEGATIVE mg/dL
Nitrite: NEGATIVE
PROTEIN: NEGATIVE mg/dL
Specific Gravity, Urine: 1.023 (ref 1.005–1.030)
pH: 6.5 (ref 5.0–8.0)

## 2015-09-21 LAB — URINE MICROSCOPIC-ADD ON

## 2015-09-21 MED ORDER — ONDANSETRON HCL 4 MG/2ML IJ SOLN
4.0000 mg | Freq: Once | INTRAMUSCULAR | Status: AC
  Administered 2015-09-21: 4 mg via INTRAVENOUS
  Filled 2015-09-21: qty 2

## 2015-09-21 MED ORDER — SODIUM CHLORIDE 0.9 % IV BOLUS (SEPSIS)
1000.0000 mL | Freq: Once | INTRAVENOUS | Status: AC
  Administered 2015-09-21: 1000 mL via INTRAVENOUS

## 2015-09-21 MED ORDER — DICLOFENAC POTASSIUM 50 MG PO TABS: 50.0000 mg | ORAL_TABLET | Freq: Three times a day (TID) | ORAL | Status: DC

## 2015-09-21 MED ORDER — HYDROMORPHONE HCL 1 MG/ML IJ SOLN
1.0000 mg | Freq: Once | INTRAMUSCULAR | Status: AC
  Administered 2015-09-21: 1 mg via INTRAVENOUS
  Filled 2015-09-21: qty 1

## 2015-09-21 MED ORDER — OXYCODONE-ACETAMINOPHEN 5-325 MG PO TABS: 1.0000 | ORAL_TABLET | Freq: Four times a day (QID) | ORAL | Status: DC | PRN

## 2015-09-21 NOTE — ED Provider Notes (Signed)
CSN: [id number]     Arrival date & time 09/21/15  1814 History   First MD Initiated Contact with Patient 09/21/15 2124     Chief Complaint  Patient presents with  . Abdominal Pain     (Consider location/radiation/quality/duration/timing/severity/associated sxs/prior Treatment) The history is provided by the patient.  Shannon Dawson is a 40 y.o. female history of ovarian cysts, kidney stone here presenting with right lower quadrant pain. Patient states that she was diagnosed with 10 cm right ovarian cyst several months ago but didn't have any insurance. [facility] but she didn't have insurance so didn't get surgery planned out. She was seen several times at urgent and OB clinic for pain and was prescribed pain meds, most recently 6/1. Since yesterday the pain came back suddenly and is severe and sharp. She states that radiated to her right flank. She states that she is also having her menstrual period right now. Denies any vaginal discharge.     Past Medical History  Diagnosis Date  . Depression   . Headache(784.0)   . Ovarian cyst     BILATERAL  . GAD (generalized anxiety disorder)   . OCD (obsessive compulsive disorder)   . Right ureteral stone   . Nephrolithiasis     bilateral non-obstructive per renal u/s 05-27-2015  . History of kidney stones   . Renal cyst, right   . Chronic pain of right knee     post knee injury 2011  . Irregular menstrual cycle   . History of heroin abuse     per epic documentation and pt detoxed 2011--  verbalized by pt clean since 2011  . History of alcohol abuse     pt verbalized by pt no alcohol since 2011   Past Surgical History  Procedure Laterality Date  . Cystoscopy with retrograde pyelogram, ureteroscopy and stent placement Right 05/19/2015    Procedure: CYSTOSCOPY WITH RETROGRADE PYELOGRAM, URETEROSCOPY AND STENT PLACEMENT;  Surgeon: Malen GauzePatrick L McKenzie, MD;  Location: [facility];  Service: Urology;  Laterality:  Right;  . Laparoscopic cholecystectomy  05-13-2009  . I & d open right knee tibia/ fibula fx's/  im nailing tibia/  closure traumatic wounds  07-18-2009  . Cysto/  right ureteroscopic laser lithotripsy stone extraction  03-14-2008  . Orif right patella fx  2013  . Cystoscopy with retrograde pyelogram, ureteroscopy and stent placement Right 06/12/2015    Procedure: CYSTOSCOPY WITH RETROGRADE PYELOGRAM, URETEROSCOPY, STONE EXTRACTION AND STENT EXCHANGE;  Surgeon: Malen GauzePatrick L McKenzie, MD;  [facility] [place];  Service: Urology;  Laterality: Right;  . Holmium laser application Right 06/12/2015    Procedure: HOLMIUM LASER APPLICATION;  Surgeon: Malen GauzePatrick L McKenzie, MD;  [facility] [place];  Service: Urology;  Laterality: Right;   History reviewed. No pertinent family history. Social History  Substance Use Topics  . Smoking status: Current Every Day Smoker -- 1.00 packs/day for 25 years    Types: Cigarettes  . Smokeless tobacco: Never Used  . Alcohol Use: No     Comment: hx alcohol abuse (per pt no alcohol since 2011)   OB History    No data available     Review of Systems  Gastrointestinal: Positive for abdominal pain.  Genitourinary: Positive for vaginal bleeding.  All other systems reviewed and are negative.     Allergies  Tramadol and Vicodin  Home Medications   Prior to Admission medications   Medication Sig Start Date End Date Taking?  Authorizing Provider  ibuprofen (ADVIL,MOTRIN) 200 MG tablet Take 200-600 mg by mouth every 6 (six) hours as needed for mild pain or moderate pain.   Yes Historical Provider, MD  diclofenac (CATAFLAM) 50 MG tablet Take 1 tablet (50 mg total) by mouth 3 (three) times daily. 09/21/15   Richardean Canal, MD  oxyCODONE-acetaminophen (PERCOCET) 5-325 MG tablet Take 1-2 tablets by mouth every 6 (six) hours as needed. 09/21/15   Richardean Canal, MD   BP 129/90 mmHg  Pulse 71  Temp(Src) 98.7 F (37.1 C) (Oral)  Resp 18  SpO2 97%   LMP 09/21/2015 Physical Exam  Constitutional: She is oriented to person, place, and time.  Uncomfortable   HENT:  Head: Normocephalic.  Mouth/Throat: Oropharynx is clear and moist.  Eyes: Conjunctivae are normal. Pupils are equal, round, and reactive to light.  Neck: Normal range of motion. Neck supple.  Cardiovascular: Normal rate, regular rhythm and normal heart sounds.   Pulmonary/Chest: Effort normal and breath sounds normal. No respiratory distress. She has no wheezes. She has no rales.  Abdominal: Soft. Bowel sounds are normal. She exhibits no distension. There is no tenderness. There is no rebound.  + R pelvic tenderness. Mild R CVAT   Genitourinary:  Refused internal exam or pelvic exam   Musculoskeletal: Normal range of motion.  Neurological: She is alert and oriented to person, place, and time.  Skin: Skin is warm and dry.  Psychiatric: She has a normal mood and affect. Her behavior is normal. Judgment and thought content normal.  Nursing note and vitals reviewed.   ED Course  Procedures (including critical care time) Labs Review Labs Reviewed  COMPREHENSIVE METABOLIC PANEL - Abnormal; Notable for the following:    Glucose, Bld 125 (*)    Albumin 3.4 (*)    ALT 11 (*)    Total Bilirubin 0.2 (*)    All other components within normal limits  URINALYSIS, ROUTINE W REFLEX MICROSCOPIC (NOT AT [facility]) - Abnormal; Notable for the following:    Color, Urine AMBER (*)    APPearance CLOUDY (*)    Hgb urine dipstick LARGE (*)    Leukocytes, UA TRACE (*)    All other components within normal limits  URINE MICROSCOPIC-ADD ON - Abnormal; Notable for the following:    Squamous Epithelial / LPF 0-5 (*)    Bacteria, UA MANY (*)    All other components within normal limits  URINE CULTURE  CBC    Imaging Review Dg Abd 1 View  09/21/2015  CLINICAL DATA:  Right flank pain for 2 days.  Rule out stone. EXAM: ABDOMEN - 1 VIEW COMPARISON:  Most recent CT 07/13/2015 FINDINGS: Normal  bowel gas pattern. No dilated bowel loops. Surgical clips in the right upper quadrant postcholecystectomy. Multiple pelvic calcifications correspond to phleboliths and vascular disease. Possible punctate stone in the mid left kidney as seen on CT. No evidence of right urolithiasis. The osseous structures are intact. IMPRESSION: No radiographic evidence of right urolithiasis. Possible punctate stone projects over the left renal bed. Bilateral pelvic calcifications combination of phleboliths and vascular. Electronically Signed   By: Rubye Oaks M.D.   On: 09/21/2015 22:43   I have personally reviewed and evaluated these images and lab results as part of my medical decision-making.   EKG Interpretation None      MDM   Final diagnoses:  Pelvic pain in female  Ovarian mass, right    Jacia Sickman is a 40 y.o. female here with R  pelvic pain. Hx of ovarian cyst and kidney stones. Has mild R CVAT as well. Consider pain from ovarian cyst vs torsion vs ruptured cyst vs renal colic. Will get labs, Ovarian US and renal US. Will give pain meds and reassess.    12:04 AM UA + blood but she is on her period. Xray ab showed no obvious kidney stone. Ovarian and renal US pending. Signed out to Dr. Patria Maneampos in the ED. If US showed no torsion and pain controlled, anticipate dc with pain meds. She has GYN follow up but can't arrange for surgery until she gets charity care. She is in the process of getting charity care.    Richardean Canalavid H Yao, MD 09/22/15 [phone]

## 2015-09-21 NOTE — Care Management (Signed)
ED CM noted patient to have had 4 ED visits in the past 6 months with related complaints. Patient is noted to be without health insurance or a PCP. [facility] Thursday 7/13 at 9a to establish care. Provided contact information on AVS.

## 2015-09-21 NOTE — ED Notes (Signed)
She c/o 2 day history of abd pain and emesis. She denies urinary changes. She reports sweats, diarrhea. She was recently diagnosed with ovarian cysts, told she needs surgical removal but can not schedule the appointment right now due to insurance. This pain feels like her ovarian cysts. She is alert and breathing easily.

## 2015-09-21 NOTE — ED Notes (Signed)
Patient transported to Ultrasound 

## 2015-09-21 NOTE — Discharge Instructions (Signed)
Take diclofenac for pain.   Take percocet for severe pain. Do not drive with it.   See GYN doctor to arrange for surgery after you get insurance.   Return to ER if you have severe pelvic pain, vomiting, fevers, trouble urinating.

## 2015-09-22 MED ORDER — PROMETHAZINE HCL 25 MG PO TABS: 25.0000 mg | ORAL_TABLET | Freq: Four times a day (QID) | ORAL | Status: DC | PRN

## 2015-09-22 MED ORDER — OXYCODONE-ACETAMINOPHEN 5-325 MG PO TABS
1.0000 | ORAL_TABLET | Freq: Once | ORAL | Status: AC
  Administered 2015-09-22: 1 via ORAL
  Filled 2015-09-22: qty 1

## 2015-09-22 MED ORDER — HYDROMORPHONE HCL 1 MG/ML IJ SOLN
1.0000 mg | Freq: Once | INTRAMUSCULAR | Status: AC
  Administered 2015-09-22: 1 mg via INTRAVENOUS
  Filled 2015-09-22: qty 1

## 2015-09-22 NOTE — ED Notes (Signed)
Pt back from us

## 2015-09-22 NOTE — ED Notes (Signed)
Dr. Patria Maneampos notified pt requesting pain medication

## 2015-10-02 ENCOUNTER — Ambulatory Visit: Payer: Self-pay | Attending: Internal Medicine

## 2015-10-02 ENCOUNTER — Ambulatory Visit: Payer: Self-pay

## 2015-11-17 ENCOUNTER — Encounter (HOSPITAL_COMMUNITY): Payer: Self-pay

## 2015-11-17 ENCOUNTER — Inpatient Hospital Stay (HOSPITAL_COMMUNITY)
Admission: RE | Admit: 2015-11-17 | Discharge: 2015-11-17 | Disposition: A | Discharge status: Home / Self Care | Payer: Self-pay | Source: Ambulatory Visit

## 2015-11-17 ENCOUNTER — Encounter (HOSPITAL_COMMUNITY)
Admission: RE | Admit: 2015-11-17 | Discharge: 2015-11-17 | Disposition: A | Discharge status: Home / Self Care | Payer: Self-pay | Source: Ambulatory Visit | Attending: Obstetrics & Gynecology | Admitting: Obstetrics & Gynecology

## 2015-11-17 DIAGNOSIS — Z01812 Encounter for preprocedural laboratory examination: Secondary | ICD-10-CM | POA: Insufficient documentation

## 2015-11-17 HISTORY — DX: Low back pain, unspecified: M54.50

## 2015-11-17 HISTORY — DX: Low back pain: M54.5

## 2015-11-17 HISTORY — DX: Anemia, unspecified: D64.9

## 2015-11-17 HISTORY — DX: Other chronic pain: G89.29

## 2015-11-17 LAB — CBC
HCT: 36.2 % (ref 36.0–46.0)
Hemoglobin: 12.2 g/dL (ref 12.0–15.0)
MCH: 30.5 pg (ref 26.0–34.0)
MCHC: 33.7 g/dL (ref 30.0–36.0)
MCV: 90.5 fL (ref 78.0–100.0)
Platelets: 334 10*3/uL (ref 150–400)
RBC: 4 MIL/uL (ref 3.87–5.11)
RDW: 14.2 % (ref 11.5–15.5)
WBC: 8.8 10*3/uL (ref 4.0–10.5)

## 2015-11-17 NOTE — Patient Instructions (Addendum)
Your procedure is scheduled on: Tuesday, 9/5  [facility] through [facility] at: 6 am  Pick up the phone at the desk and dial [phone].  Call this number if you have problems the morning of surgery: [phone].  Remember: Do NOT eat or drink (including water) after Midnight Monday 9/4  Take these medicines the morning of surgery with a SIP OF WATER:  Tylenol and Xanax if needed   Do NOT wear jewelry (body piercing), metal hair clips/bobby pins, make-up, or nail polish. Do NOT wear lotions, powders, or perfumes.  You may wear deoderant. Do NOT shave for 48 hours prior to surgery. Do NOT bring valuables to the hospital. Do not smoke 24 hours prior to surgery.  Leave suitcase in car.  After surgery it may be brought to your room.  For patients admitted to the hospital, checkout time is 11:00 AM the day of discharge. Have a responsible adult drive you home and stay with you for 24 hours after your procedure. Home with Mother [place] Karen cell [phone] or Step Mom Terri cell [phone].

## 2015-11-17 NOTE — Patient Instructions (Signed)
Your procedure is scheduled on: Tuesday, 9/5  [facility] through the [facility] at: 9:30 am  Pick up the phone at the desk and dial [phone].  Call this number if you have problems the morning of surgery: [phone].  Remember: Do NOT eat or drink (including water) after midnight Monday, 9/4  Take these medicines the morning of surgery with a SIP OF WATER:  None  Do NOT wear jewelry (body piercing), metal hair clips/bobby pins, make-up, or nail polish. Do NOT wear lotions, powders, or perfumes.  You may wear deoderant. Do NOT shave for 48 hours prior to surgery. Do NOT bring valuables to the hospital. Contacts, dentures, or bridgework may not be worn into surgery.  Leave suitcase in car.  After surgery it may be brought to your room.  For patients admitted to the hospital, checkout time is 11:00 AM the day of discharge. Have a responsible adult drive you home and stay with you for 24 hours after your procedure

## 2015-11-20 NOTE — Anesthesia Preprocedure Evaluation (Addendum)
Anesthesia Evaluation  Patient identified by MRN, date of birth, ID band Patient awake    Reviewed: Allergy & Precautions, NPO status , Patient's Chart, lab work & pertinent test results  History of Anesthesia Complications Negative for: history of anesthetic complications  Airway Mallampati: III  TM Distance: >3 FB Neck ROM: Full   Comment: Previous grade I view with [facility] 2 Dental  (+) Edentulous Upper, Dental Advisory Given   Pulmonary neg shortness of breath, neg sleep apnea, neg COPD, neg recent URI, Current Smoker,    Pulmonary exam normal breath sounds clear to auscultation       Cardiovascular (-) hypertension(-) angina(-) Past MI, (-) Cardiac Stents, (-) CABG and (-) Orthopnea  Rhythm:Regular Rate:Normal     Neuro/Psych  Headaches, neg Seizures PSYCHIATRIC DISORDERS (OCD) Anxiety Depression Bipolar Disorder    GI/Hepatic negative GI ROS, neg GERD  ,(+)     substance abuse (history of; reports no use since 2011)  alcohol use and IV drug use,   Endo/Other  negative endocrine ROSneg diabetes  Renal/GU Renal disease (h/o nephrolithiasis)     Musculoskeletal Chronic low back pain   Abdominal (+) + obese,   Peds  Hematology  (+) Blood dyscrasia, anemia ,   Anesthesia Other Findings Ovarian cyst  Reproductive/Obstetrics                            Anesthesia Physical Anesthesia Plan  ASA: III  Anesthesia Plan: General   Post-op Pain Management:    Induction: Intravenous  Airway Management Planned: Oral ETT  Additional Equipment:   Intra-op Plan:   Post-operative Plan: Extubation in OR  Informed Consent: I have reviewed the patients History and Physical, chart, labs and discussed the procedure including the risks, benefits and alternatives for the proposed anesthesia with the patient or authorized representative who has indicated his/her understanding and acceptance.   Dental  advisory given  Plan Discussed with: CRNA  Anesthesia Plan Comments: (Risks of general anesthesia discussed including, but not limited to, sore throat, hoarse voice, chipped/damaged teeth, injury to vocal cords, nausea and vomiting, allergic reactions, lung infection, heart attack, stroke, and death. All questions answered. )       Anesthesia Quick Evaluation

## 2015-11-21 ENCOUNTER — Ambulatory Visit (HOSPITAL_COMMUNITY): Payer: Self-pay | Admitting: Anesthesiology

## 2015-11-21 ENCOUNTER — Encounter (HOSPITAL_COMMUNITY): Payer: Self-pay | Admitting: *Deleted

## 2015-11-21 ENCOUNTER — Observation Stay (HOSPITAL_COMMUNITY)
Admission: RE | Admit: 2015-11-21 | Discharge: 2015-11-22 | Disposition: A | Discharge status: Home / Self Care | Payer: Self-pay | Source: Ambulatory Visit | Attending: Obstetrics & Gynecology | Admitting: Obstetrics & Gynecology

## 2015-11-21 ENCOUNTER — Encounter (HOSPITAL_COMMUNITY)
Admission: RE | Disposition: A | Discharge status: Home / Self Care | Payer: Self-pay | Source: Ambulatory Visit | Attending: Obstetrics & Gynecology

## 2015-11-21 DIAGNOSIS — F1020 Alcohol dependence, uncomplicated: Secondary | ICD-10-CM | POA: Diagnosis present

## 2015-11-21 DIAGNOSIS — F319 Bipolar disorder, unspecified: Secondary | ICD-10-CM | POA: Diagnosis present

## 2015-11-21 DIAGNOSIS — N83209 Unspecified ovarian cyst, unspecified side: Secondary | ICD-10-CM

## 2015-11-21 DIAGNOSIS — N83201 Unspecified ovarian cyst, right side: Secondary | ICD-10-CM

## 2015-11-21 DIAGNOSIS — F411 Generalized anxiety disorder: Secondary | ICD-10-CM | POA: Diagnosis present

## 2015-11-21 DIAGNOSIS — F419 Anxiety disorder, unspecified: Secondary | ICD-10-CM | POA: Insufficient documentation

## 2015-11-21 DIAGNOSIS — F1120 Opioid dependence, uncomplicated: Secondary | ICD-10-CM | POA: Diagnosis present

## 2015-11-21 DIAGNOSIS — N83202 Unspecified ovarian cyst, left side: Secondary | ICD-10-CM | POA: Insufficient documentation

## 2015-11-21 DIAGNOSIS — F429 Obsessive-compulsive disorder, unspecified: Secondary | ICD-10-CM | POA: Diagnosis present

## 2015-11-21 DIAGNOSIS — N83291 Other ovarian cyst, right side: Principal | ICD-10-CM | POA: Insufficient documentation

## 2015-11-21 DIAGNOSIS — F17210 Nicotine dependence, cigarettes, uncomplicated: Secondary | ICD-10-CM | POA: Insufficient documentation

## 2015-11-21 DIAGNOSIS — Z9889 Other specified postprocedural states: Secondary | ICD-10-CM

## 2015-11-21 HISTORY — PX: LAPAROTOMY: SHX154

## 2015-11-21 LAB — PREGNANCY, URINE: Preg Test, Ur: NEGATIVE

## 2015-11-21 SURGERY — LAPAROTOMY
Anesthesia: General | Site: Abdomen | Laterality: Right

## 2015-11-21 MED ORDER — ONDANSETRON HCL 4 MG PO TABS: 4.0000 mg | ORAL_TABLET | Freq: Four times a day (QID) | ORAL | Status: DC | PRN

## 2015-11-21 MED ORDER — MIDAZOLAM HCL 2 MG/2ML IJ SOLN
INTRAMUSCULAR | Status: AC
  Filled 2015-11-21: qty 2

## 2015-11-21 MED ORDER — KETOROLAC TROMETHAMINE 30 MG/ML IJ SOLN: 30.0000 mg | Freq: Four times a day (QID) | INTRAMUSCULAR | Status: DC

## 2015-11-21 MED ORDER — FENTANYL CITRATE (PF) 100 MCG/2ML IJ SOLN
INTRAMUSCULAR | Status: AC
  Filled 2015-11-21: qty 2

## 2015-11-21 MED ORDER — CYCLOBENZAPRINE HCL 5 MG PO TABS
5.0000 mg | ORAL_TABLET | Freq: Three times a day (TID) | ORAL | Status: DC | PRN
  Filled 2015-11-21: qty 1

## 2015-11-21 MED ORDER — KETOROLAC TROMETHAMINE 30 MG/ML IJ SOLN
INTRAMUSCULAR | Status: AC
  Filled 2015-11-21: qty 1

## 2015-11-21 MED ORDER — PROPOFOL 10 MG/ML IV BOLUS
INTRAVENOUS | Status: DC | PRN
  Administered 2015-11-21: 200 mg via INTRAVENOUS
  Administered 2015-11-21: 50 mg via INTRAVENOUS

## 2015-11-21 MED ORDER — PROMETHAZINE HCL 25 MG/ML IJ SOLN: 6.2500 mg | INTRAMUSCULAR | Status: DC | PRN

## 2015-11-21 MED ORDER — PROPOFOL 10 MG/ML IV BOLUS
INTRAVENOUS | Status: AC
  Filled 2015-11-21: qty 20

## 2015-11-21 MED ORDER — ONDANSETRON HCL 4 MG/2ML IJ SOLN
INTRAMUSCULAR | Status: AC
  Filled 2015-11-21: qty 2

## 2015-11-21 MED ORDER — LIDOCAINE HCL (CARDIAC) 20 MG/ML IV SOLN
INTRAVENOUS | Status: DC | PRN
  Administered 2015-11-21: 80 mg via INTRAVENOUS

## 2015-11-21 MED ORDER — OXYCODONE-ACETAMINOPHEN 5-325 MG PO TABS
1.0000 | ORAL_TABLET | ORAL | Status: DC | PRN
  Administered 2015-11-21 – 2015-11-22 (×4): 2 via ORAL
  Filled 2015-11-21 (×4): qty 2

## 2015-11-21 MED ORDER — LIDOCAINE HCL (CARDIAC) 20 MG/ML IV SOLN
INTRAVENOUS | Status: AC
  Filled 2015-11-21: qty 5

## 2015-11-21 MED ORDER — ROCURONIUM BROMIDE 100 MG/10ML IV SOLN
INTRAVENOUS | Status: DC | PRN
  Administered 2015-11-21: 50 mg via INTRAVENOUS

## 2015-11-21 MED ORDER — BISACODYL 10 MG RE SUPP: 10.0000 mg | Freq: Every day | RECTAL | Status: DC | PRN

## 2015-11-21 MED ORDER — SODIUM CHLORIDE 0.9 % IJ SOLN
INTRAMUSCULAR | Status: AC
  Filled 2015-11-21: qty 10

## 2015-11-21 MED ORDER — SIMETHICONE 80 MG PO CHEW
80.0000 mg | CHEWABLE_TABLET | Freq: Four times a day (QID) | ORAL | Status: DC | PRN
  Administered 2015-11-21: 80 mg via ORAL
  Filled 2015-11-21: qty 1

## 2015-11-21 MED ORDER — DEXAMETHASONE SODIUM PHOSPHATE 10 MG/ML IJ SOLN
INTRAMUSCULAR | Status: DC | PRN
  Administered 2015-11-21: 4 mg via INTRAVENOUS

## 2015-11-21 MED ORDER — FENTANYL CITRATE (PF) 250 MCG/5ML IJ SOLN
INTRAMUSCULAR | Status: AC
  Filled 2015-11-21: qty 5

## 2015-11-21 MED ORDER — HYDROMORPHONE HCL 1 MG/ML IJ SOLN
0.2500 mg | INTRAMUSCULAR | Status: DC | PRN
  Administered 2015-11-21 (×4): 0.5 mg via INTRAVENOUS

## 2015-11-21 MED ORDER — ACETAMINOPHEN 10 MG/ML IV SOLN
1000.0000 mg | Freq: Once | INTRAVENOUS | Status: DC
  Filled 2015-11-21: qty 100

## 2015-11-21 MED ORDER — KETOROLAC TROMETHAMINE 30 MG/ML IJ SOLN
INTRAMUSCULAR | Status: DC | PRN
  Administered 2015-11-21: 30 mg via INTRAVENOUS

## 2015-11-21 MED ORDER — FENTANYL CITRATE (PF) 100 MCG/2ML IJ SOLN
50.0000 ug | Freq: Once | INTRAMUSCULAR | Status: AC
  Administered 2015-11-21: 50 ug via INTRAVENOUS

## 2015-11-21 MED ORDER — MIDAZOLAM HCL 2 MG/2ML IJ SOLN
INTRAMUSCULAR | Status: DC | PRN
Start: 2015-11-21 — End: 2015-11-21
  Administered 2015-11-21 (×2): 2 mg via INTRAVENOUS

## 2015-11-21 MED ORDER — DEXTROSE-NACL 5-0.45 % IV SOLN
INTRAVENOUS | Status: DC
  Administered 2015-11-21 (×2): via INTRAVENOUS

## 2015-11-21 MED ORDER — FENTANYL CITRATE (PF) 100 MCG/2ML IJ SOLN
INTRAMUSCULAR | Status: DC | PRN
  Administered 2015-11-21 (×5): 50 ug via INTRAVENOUS
  Administered 2015-11-21: 100 ug via INTRAVENOUS
  Administered 2015-11-21: 25 ug via INTRAVENOUS
  Administered 2015-11-21: 50 ug via INTRAVENOUS
  Administered 2015-11-21: 25 ug via INTRAVENOUS
  Administered 2015-11-21 (×2): 50 ug via INTRAVENOUS
  Administered 2015-11-21: 100 ug via INTRAVENOUS
  Administered 2015-11-21: 50 ug via INTRAVENOUS

## 2015-11-21 MED ORDER — OXYCODONE HCL 5 MG PO TABS
5.0000 mg | ORAL_TABLET | Freq: Once | ORAL | Status: AC
  Administered 2015-11-21: 5 mg via ORAL

## 2015-11-21 MED ORDER — CEFAZOLIN SODIUM-DEXTROSE 2-3 GM-% IV SOLR
INTRAVENOUS | Status: DC | PRN
  Administered 2015-11-21: 2 g via INTRAVENOUS

## 2015-11-21 MED ORDER — HYDROMORPHONE HCL 1 MG/ML IJ SOLN
INTRAMUSCULAR | Status: AC
  Filled 2015-11-21: qty 1

## 2015-11-21 MED ORDER — OXYCODONE HCL 5 MG PO TABS
ORAL_TABLET | ORAL | Status: AC
  Administered 2015-11-21: 5 mg via ORAL
  Filled 2015-11-21: qty 1

## 2015-11-21 MED ORDER — SCOPOLAMINE 1 MG/3DAYS TD PT72
1.0000 | MEDICATED_PATCH | Freq: Once | TRANSDERMAL | Status: DC
  Administered 2015-11-21: 1.5 mg via TRANSDERMAL

## 2015-11-21 MED ORDER — KETOROLAC TROMETHAMINE 30 MG/ML IJ SOLN
30.0000 mg | Freq: Four times a day (QID) | INTRAMUSCULAR | Status: DC
  Administered 2015-11-21 – 2015-11-22 (×3): 30 mg via INTRAVENOUS
  Filled 2015-11-21 (×3): qty 1

## 2015-11-21 MED ORDER — ONDANSETRON HCL 4 MG/2ML IJ SOLN
INTRAMUSCULAR | Status: DC | PRN
  Administered 2015-11-21: 4 mg via INTRAVENOUS

## 2015-11-21 MED ORDER — ROCURONIUM BROMIDE 100 MG/10ML IV SOLN
INTRAVENOUS | Status: AC
  Filled 2015-11-21: qty 1

## 2015-11-21 MED ORDER — HYDROMORPHONE HCL 1 MG/ML IJ SOLN
INTRAMUSCULAR | Status: DC | PRN
  Administered 2015-11-21 (×2): 0.5 mg via INTRAVENOUS

## 2015-11-21 MED ORDER — LACTATED RINGERS IV SOLN
INTRAVENOUS | Status: DC
  Administered 2015-11-21 (×3): via INTRAVENOUS

## 2015-11-21 MED ORDER — DEXAMETHASONE SODIUM PHOSPHATE 4 MG/ML IJ SOLN
INTRAMUSCULAR | Status: AC
  Filled 2015-11-21: qty 1

## 2015-11-21 MED ORDER — SUGAMMADEX SODIUM 200 MG/2ML IV SOLN
INTRAVENOUS | Status: DC | PRN
  Administered 2015-11-21: 150 mg via INTRAVENOUS

## 2015-11-21 MED ORDER — IBUPROFEN 600 MG PO TABS
600.0000 mg | ORAL_TABLET | Freq: Four times a day (QID) | ORAL | Status: DC | PRN
  Administered 2015-11-22: 600 mg via ORAL
  Filled 2015-11-21: qty 1

## 2015-11-21 MED ORDER — ONDANSETRON HCL 4 MG/2ML IJ SOLN: 4.0000 mg | Freq: Four times a day (QID) | INTRAMUSCULAR | Status: DC | PRN

## 2015-11-21 MED ORDER — HYDROMORPHONE HCL 1 MG/ML IJ SOLN
0.2000 mg | INTRAMUSCULAR | Status: DC | PRN
  Administered 2015-11-21 (×2): 0.6 mg via INTRAVENOUS
  Filled 2015-11-21 (×2): qty 1

## 2015-11-21 MED ORDER — BUPIVACAINE HCL (PF) 0.5 % IJ SOLN
INTRAMUSCULAR | Status: DC | PRN
Start: 2015-11-21 — End: 2015-11-21
  Administered 2015-11-21: 30 mL

## 2015-11-21 MED ORDER — PANTOPRAZOLE SODIUM 40 MG PO TBEC: 40.0000 mg | DELAYED_RELEASE_TABLET | Freq: Every day | ORAL | Status: DC

## 2015-11-21 MED ORDER — BUPIVACAINE HCL (PF) 0.5 % IJ SOLN
INTRAMUSCULAR | Status: AC
  Filled 2015-11-21: qty 30

## 2015-11-21 MED ORDER — SCOPOLAMINE 1 MG/3DAYS TD PT72
MEDICATED_PATCH | TRANSDERMAL | Status: AC
  Filled 2015-11-21: qty 1

## 2015-11-21 MED ORDER — SUGAMMADEX SODIUM 200 MG/2ML IV SOLN
INTRAVENOUS | Status: AC
  Filled 2015-11-21: qty 2

## 2015-11-21 MED ORDER — ALPRAZOLAM 0.5 MG PO TABS
1.0000 mg | ORAL_TABLET | Freq: Every evening | ORAL | Status: DC | PRN
  Administered 2015-11-21: 1 mg via ORAL
  Filled 2015-11-21: qty 2

## 2015-11-21 MED ORDER — FENTANYL CITRATE (PF) 100 MCG/2ML IJ SOLN
25.0000 ug | INTRAMUSCULAR | Status: DC | PRN
  Administered 2015-11-21 (×3): 50 ug via INTRAVENOUS

## 2015-11-21 SURGICAL SUPPLY — 52 items
APL SKNCLS STERI-STRIP NONHPOA (GAUZE/BANDAGES/DRESSINGS) ×1
BARRIER ADHS 3X4 INTERCEED (GAUZE/BANDAGES/DRESSINGS) IMPLANT
BENZOIN TINCTURE PRP APPL 2/3 (GAUZE/BANDAGES/DRESSINGS) ×2 IMPLANT
BLADE SURG 10 STRL SS (BLADE) ×6 IMPLANT
BRR ADH 4X3 ABS CNTRL BYND (GAUZE/BANDAGES/DRESSINGS)
CANISTER SUCT 3000ML (MISCELLANEOUS) ×3 IMPLANT
CELLS DAT CNTRL 66122 CELL SVR (MISCELLANEOUS) IMPLANT
CLOSURE WOUND 1/2 X4 (GAUZE/BANDAGES/DRESSINGS) ×1
CLOTH BEACON ORANGE TIMEOUT ST (SAFETY) ×3 IMPLANT
CONT PATH 16OZ SNAP LID 3702 (MISCELLANEOUS) ×1 IMPLANT
CONT SPEC PATH 64OZ SNAP LID (MISCELLANEOUS) ×2 IMPLANT
DECANTER SPIKE VIAL GLASS SM (MISCELLANEOUS) ×2 IMPLANT
DRAPE CESAREAN BIRTH W POUCH (DRAPES) ×2 IMPLANT
DRAPE WARM FLUID 44X44 (DRAPE) IMPLANT
DRSG OPSITE POSTOP 4X10 (GAUZE/BANDAGES/DRESSINGS) ×2 IMPLANT
DURAPREP 26ML APPLICATOR (WOUND CARE) ×6 IMPLANT
GAUZE SPONGE 4X4 12PLY STRL (GAUZE/BANDAGES/DRESSINGS) ×3 IMPLANT
GAUZE SPONGE 4X4 16PLY XRAY LF (GAUZE/BANDAGES/DRESSINGS) IMPLANT
GLOVE BIO SURGEON STRL SZ7 (GLOVE) ×3 IMPLANT
GLOVE BIOGEL PI IND STRL 7.0 (GLOVE) ×2 IMPLANT
GLOVE BIOGEL PI INDICATOR 7.0 (GLOVE) ×4
GOWN STRL REUS W/TWL LRG LVL3 (GOWN DISPOSABLE) ×6 IMPLANT
GOWN STRL REUS W/TWL XL LVL3 (GOWN DISPOSABLE) ×3 IMPLANT
NEEDLE HYPO 22GX1.5 SAFETY (NEEDLE) ×3 IMPLANT
NS IRRIG 1000ML POUR BTL (IV SOLUTION) ×5 IMPLANT
PACK ABDOMINAL GYN (CUSTOM PROCEDURE TRAY) ×3 IMPLANT
PAD ABD 7.5X8 STRL (GAUZE/BANDAGES/DRESSINGS) ×4 IMPLANT
PAD OB MATERNITY 4.3X12.25 (PERSONAL CARE ITEMS) ×3 IMPLANT
PENCIL SMOKE EVAC W/HOLSTER (ELECTROSURGICAL) ×1 IMPLANT
PROTECTOR NERVE ULNAR (MISCELLANEOUS) ×6 IMPLANT
RETRACTOR WND ALEXIS 18 MED (MISCELLANEOUS) IMPLANT
RETRACTOR WND ALEXIS 25 LRG (MISCELLANEOUS) IMPLANT
RTRCTR WOUND ALEXIS 18CM MED (MISCELLANEOUS)
RTRCTR WOUND ALEXIS 25CM LRG (MISCELLANEOUS)
SPONGE DRAIN TRACH 4X4 STRL 2S (GAUZE/BANDAGES/DRESSINGS) ×2 IMPLANT
SPONGE LAP 18X18 X RAY DECT (DISPOSABLE) ×2 IMPLANT
STAPLER VISISTAT 35W (STAPLE) ×1 IMPLANT
STRIP CLOSURE SKIN 1/2X4 (GAUZE/BANDAGES/DRESSINGS) ×1 IMPLANT
SUT VIC AB 0 CT1 18XCR BRD8 (SUTURE) IMPLANT
SUT VIC AB 0 CT1 27 (SUTURE) ×6
SUT VIC AB 0 CT1 27XBRD ANBCTR (SUTURE) ×2 IMPLANT
SUT VIC AB 0 CT1 8-18 (SUTURE) ×3
SUT VIC AB 3-0 CT1 27 (SUTURE) ×3
SUT VIC AB 3-0 CT1 TAPERPNT 27 (SUTURE) ×1 IMPLANT
SUT VIC AB 3-0 SH 27 (SUTURE)
SUT VIC AB 3-0 SH 27X BRD (SUTURE) IMPLANT
SUT VIC AB 4-0 KS 27 (SUTURE) ×2 IMPLANT
SUT VICRYL 0 TIES 12 18 (SUTURE) IMPLANT
SYR CONTROL 10ML LL (SYRINGE) ×3 IMPLANT
TOWEL OR 17X24 6PK STRL BLUE (TOWEL DISPOSABLE) ×6 IMPLANT
TRAY FOLEY CATH SILVER 14FR (SET/KITS/TRAYS/PACK) ×3 IMPLANT
WATER STERILE IRR 1000ML POUR (IV SOLUTION) ×1 IMPLANT

## 2015-11-21 NOTE — Transfer of Care (Signed)
Immediate Anesthesia Transfer of Care Note  Patient: Shannon Dawson  Procedure(s) Performed: Procedure(s) with comments: LAPAROTOMY, RIght Salpingo Oophrectomy (Right) - right oopherectomy with removal of ovarian cyst   Patient Location: PACU  Anesthesia Type:General  Level of Consciousness: awake, alert , oriented and patient cooperative  Airway & Oxygen Therapy: Patient Spontanous Breathing and Patient connected to nasal cannula oxygen  Post-op Assessment: Report given to RN and Post -op Vital signs reviewed and stable  Post vital signs: Reviewed and stable  Last Vitals:  Vitals:   11/21/15 0615  BP: (!) 132/98  Pulse: 85  Resp: 18  Temp: 36.9 C    Last Pain:  Vitals:   11/21/15 0615  TempSrc: Oral  PainSc: 3       Patients Stated Pain Goal: 3 (11/21/15 0615)  Complications: No apparent anesthesia complications

## 2015-11-21 NOTE — Op Note (Signed)
11/21/2015  8:43 AM  PATIENT:  Shannon Dawson  40 y.o. female  PRE-OPERATIVE DIAGNOSIS:  Right Ovarian cyst  POST-OPERATIVE DIAGNOSIS:  Right Ovarian cyst  PROCEDURE:  Procedure(s) with comments: LAPAROTOMY, RIght Salpingo Oophrectomy (Right) - right oopherectomy with removal of ovarian cyst   SURGEON:  Surgeon(s) and Role:    * Willodean Rosenthal, MD - Primary    * Quincy Bing, MD - Assisting  ANESTHESIA:   general  EBL:  Total I/O In: 1900 [I.V.:1900] Out: 100 [Urine:50; Blood:50]  BLOOD ADMINISTERED:none  DRAINS: none   LOCAL MEDICATIONS USED:  MARCAINE     SPECIMEN:  Source of Specimen:  right ovary and fallopian tube including ovarian cyst  DISPOSITION OF SPECIMEN:  PATHOLOGY  COUNTS:  YES  TOURNIQUET:  * No tourniquets in log *  DICTATION: .Note written in EPIC  PLAN OF CARE: Admit to inpatient   PATIENT DISPOSITION:  PACU - hemodynamically stable.   Delay start of Pharmacological VTE agent (>24hrs) due to surgical blood loss or risk of bleeding: yes  Complications: none immediate   NDICATIONS: 39y.o. here with persistent right ovarian cyst.  It was stable in size and had repeated US and CT scan in various health care facilities.  Pt had persistent pain due to the cyst. Patient was counseled regarding resection of ovarian mass. Risks of surgery including bleeding which may require transfusion or reoperation, infection, injury to bowel or other surrounding organs, need for additional procedures including laparotomy and other postoperative/anesthesia complications were explained to patient.  Written informed consent was obtained.  FINDINGS: 11cm right ovarian cyst.    PROCEDURE IN DETAIL:  The patient was taken to the operating room where general anesthesia was administered and was found to be adequate.  She was placed in the dorsal supine position, and was prepped and draped in a sterile manner.  A Foley catheter was inserted into her bladder and attached  to constant drainage.  Her urine drained clear throughout the procedure.  After an adequate timeout was performed a Pfannenstiel incision was made with a scalpel.  This incision was carried down to the fascia using a scalpel and electrocautery.  The fascia was incised in the midline and this incision was extended bilaterally using the Mayo scissors.  Kocher's were applied to the superior aspect of the fascial incision, and the rectus muscles were dissected off bluntly and sharply using the Mayo scissors.  A similar process was carried out at the inferior aspect of the incision.  The rectus muscles were separated in the midline bluntly and the peritoneum was identified and entered using Metzenbaum scissors.  The incision was extended superiorly and inferiorly using the scalpel with good visualization of the bladder and bowel. The bowel were packed away with moist laparotomy sponges and The ovarian cyst was immediately visible and carefully brought through the incision intact.  The infundibulopelvic ligament was identified and doubly clamped with Kelly clamps and transected and suture ligated.  The pelvis was irrigated copiously. Excellent hemostasis was noted. The laparotomy sponges and all instruments were then removed from the abdomen. An interrupted suture was used to reapproximate the peritoneum and the fascia was reapproximated with 0 Vicryl running stitch and the subcutaneous layerwas reapproximated with 3-0 Vicryl.  The skin was closed with 4-0 vicryl. 30cc of .5%Marcaine was injected into the incision.  The patient tolerated the procedure well. There were no complications during this case.  Sponge, lap, needle and instrument counts were correct.     Kayleana Waites L.  Harraway-Smith, M.D., [facility]

## 2015-11-21 NOTE — Brief Op Note (Signed)
11/21/2015  8:43 AM  PATIENT:  Shannon Dawson  40 y.o. female  PRE-OPERATIVE DIAGNOSIS:  Right Ovarian cyst  POST-OPERATIVE DIAGNOSIS:  Right Ovarian cyst  PROCEDURE:  Procedure(s) with comments: LAPAROTOMY, RIght Salpingo Oophrectomy (Right) - right oopherectomy with removal of ovarian cyst   SURGEON:  Surgeon(s) and Role:    * Willodean Rosenthalarolyn Harraway-Smith, MD - Primary    * Arab Bingharlie Pickens, MD - Assisting  ANESTHESIA:   general  EBL:  Total I/O In: 1900 [I.V.:1900] Out: 100 [Urine:50; Blood:50]  BLOOD ADMINISTERED:none  DRAINS: none   LOCAL MEDICATIONS USED:  MARCAINE     SPECIMEN:  Source of Specimen:  right ovary and fallopian tube including ovarian cyst  DISPOSITION OF SPECIMEN:  PATHOLOGY  COUNTS:  YES  TOURNIQUET:  * No tourniquets in log *  DICTATION: .Note written in EPIC  PLAN OF CARE: Admit to inpatient   PATIENT DISPOSITION:  PACU - hemodynamically stable.   Delay start of Pharmacological VTE agent (>24hrs) due to surgical blood loss or risk of bleeding: yes  Complications: none immediate  Shannon Dawson L. Harraway-Smith, M.D., [facility]

## 2015-11-21 NOTE — Plan of Care (Signed)
Problem: Activity: Goal: Risk for activity intolerance will decrease Outcome: Completed/Met Date Met: 11/21/15 Up in halls x5+ today

## 2015-11-21 NOTE — H&P (Signed)
Preoperative History and Physical  Shannon Dawson is a 40 y.o. No obstetric history on file. here for surgical management of ovarian cyst.   Proposed surgery:  laparotomy with oophorectomy  Past Medical History:  Diagnosis Date  . Anemia    hx  . Anxiety   . Chronic lower back pain   . Chronic pain of right knee    post knee injury 2011, pins and wires, rod  . Depression    hx  . GAD (generalized anxiety disorder)   . Headache(784.0)    otc med prn  . History of alcohol abuse    pt verbalized by pt no alcohol since 2011  . History of heroin abuse    per epic documentation and pt detoxed 2011--  verbalized by pt clean since 2011  . History of kidney stones   . Irregular menstrual cycle   . Nephrolithiasis    bilateral non-obstructive per renal u/s 05-27-2015  . OCD (obsessive compulsive disorder)   . Ovarian cyst    BILATERAL  . Renal cyst, right   . Right ureteral stone   . SVD (spontaneous vaginal delivery)    x 2   Past Surgical History:  Procedure Laterality Date  . CYSTO/  RIGHT URETEROSCOPIC LASER LITHOTRIPSY STONE EXTRACTION  03-14-2008  . CYSTOSCOPY WITH RETROGRADE PYELOGRAM, URETEROSCOPY AND STENT PLACEMENT Right 05/19/2015   Procedure: CYSTOSCOPY WITH RETROGRADE PYELOGRAM, URETEROSCOPY AND STENT PLACEMENT;  Surgeon: Malen Gauze, MD;  Location: [facility];  Service: Urology;  Laterality: Right;  . CYSTOSCOPY WITH RETROGRADE PYELOGRAM, URETEROSCOPY AND STENT PLACEMENT Right 06/12/2015   Procedure: CYSTOSCOPY WITH RETROGRADE PYELOGRAM, URETEROSCOPY, STONE EXTRACTION AND STENT EXCHANGE;  Surgeon: Malen Gauze, MD;  Location: [facility];  Service: Urology;  Laterality: Right;  . HOLMIUM LASER APPLICATION Right 06/12/2015   Procedure: HOLMIUM LASER APPLICATION;  Surgeon: Malen Gauze, MD;  Location: [facility];  Service: Urology;  Laterality: Right;  . I & D OPEN RIGHT KNEE TIBIA/ FIBULA FX'S/  IM NAILING TIBIA/  CLOSURE  TRAUMATIC WOUNDS  07-18-2009  . LAPAROSCOPIC CHOLECYSTECTOMY  05-13-2009  . MULTIPLE TOOTH EXTRACTIONS     cracked and chipped lower teeth, no upper teeth  . ORIF RIGHT PATELLA FX  2013  . WISDOM TOOTH EXTRACTION     OB History    No data available     Patient denies any cervical dysplasia or STIs. Prescriptions Prior to Admission  Medication Sig Dispense Refill Last Dose  . acetaminophen (TYLENOL) 650 MG CR tablet Take 650 mg by mouth every 8 (eight) hours as needed for pain.   11/21/2015 at 0400  . ALPRAZolam (XANAX) 1 MG tablet Take 1 mg by mouth at bedtime as needed for anxiety.   11/20/2015 at Unknown time  . cyclobenzaprine (FLEXERIL) 5 MG tablet Take 5 mg by mouth 3 (three) times daily as needed for muscle spasms.   11/20/2015 at Unknown time  . ibuprofen (ADVIL,MOTRIN) 200 MG tablet Take 600 mg by mouth every 6 (six) hours as needed for mild pain or moderate pain.    11/20/2015 at Unknown time  . meloxicam (MOBIC) 7.5 MG tablet Take 7.5 mg by mouth 2 (two) times daily as needed for pain.   Past Week at Unknown time  . oxyCODONE-acetaminophen (PERCOCET) 5-325 MG tablet Take 1-2 tablets by mouth every 6 (six) hours as needed. 8 tablet 0 Past Month at Unknown time    Allergies  Allergen Reactions  . Tramadol Itching  Severe  . Vicodin [Hydrocodone-Acetaminophen] Itching    Itching is severe.    Social History:   reports that she has been smoking Cigarettes.  She has a 25.00 pack-year smoking history. She has never used smokeless tobacco. She reports that she does not drink alcohol or use drugs. History reviewed. No pertinent family history.  Review of Systems: Noncontributory  PHYSICAL EXAM: Blood pressure (!) 132/98, pulse 85, temperature 98.5 F (36.9 C), temperature source Oral, resp. rate 18, last menstrual period 10/25/2015, SpO2 99 %. General appearance - alert, well appearing, and in no distress Chest - clear to auscultation, no wheezes, rales or rhonchi, symmetric air  entry Heart - normal rate and regular rhythm Abdomen - soft, nontender, nondistended, no masses or organomegaly Pelvic - examination not indicated Extremities - peripheral pulses normal, no pedal edema, no clubbing or cyanosis  Labs: Results for orders placed or performed during the hospital encounter of 11/21/15 (from the past 336 hour(s))  Pregnancy, urine   Collection Time: 11/21/15  6:00 AM  Result Value Ref Range   Preg Test, Ur NEGATIVE NEGATIVE  Results for orders placed or performed during the hospital encounter of 11/17/15 (from the past 336 hour(s))  CBC   Collection Time: 11/17/15  3:00 PM  Result Value Ref Range   WBC 8.8 4.0 - 10.5 K/uL   RBC 4.00 3.87 - 5.11 MIL/uL   Hemoglobin 12.2 12.0 - 15.0 g/dL   HCT 16.136.2 09.636.0 - 04.546.0 %   MCV 90.5 78.0 - 100.0 fL   MCH 30.5 26.0 - 34.0 pg   MCHC 33.7 30.0 - 36.0 g/dL   RDW 40.914.2 81.111.5 - 91.415.5 %   Platelets 334 150 - 400 K/uL    Imaging Studies: No results found.  Assessment: Patient Active Problem List   Diagnosis Date Noted  . Nephrolithiasis 05/19/2015  . OCD (obsessive compulsive disorder) 09/05/2011  . Bipolar disorder (HCC) 09/05/2011  . Generalized anxiety disorder 09/05/2011  . Opioid dependence (HCC) 08/29/2011  . Alcohol dependence (HCC) 08/29/2011    Plan: Patient will undergo surgical management with laparotomy with oophorectomy.   The risks of surgery were discussed in detail with the patient including but not limited to: bleeding which may require transfusion or reoperation; infection which may require antibiotics; injury to surrounding organs which may involve bowel, bladder, ureters ; need for additional procedures including laparoscopy or laparotomy; thromboembolic phenomenon, surgical site problems and other postoperative/anesthesia complications. Likelihood of success in alleviating the patient's condition was discussed. Routine postoperative instructions will be reviewed with the patient and her family in  detail after surgery.  The patient concurred with the proposed plan, giving informed written consent for the surgery.  Patient has been NPO since last night she will remain NPO for procedure.  Anesthesia and OR aware.  Preoperative prophylactic antibiotics and SCDs ordered on call to the OR.  To OR when ready.  Ayme Short L. Harraway-Smith, M.D., [facility] 11/21/2015 7:09 AM

## 2015-11-21 NOTE — Anesthesia Procedure Notes (Signed)
Procedure Name: Intubation Date/Time: 11/21/2015 7:31 AM Performed by: Yolonda KidaARVER, Orrin Yurkovich L Pre-anesthesia Checklist: Patient identified, Emergency Drugs available, Suction available and Patient being monitored Patient Re-evaluated:Patient Re-evaluated prior to inductionOxygen Delivery Method: Circle system utilized Preoxygenation: Pre-oxygenation with 100% oxygen Intubation Type: IV induction Ventilation: Mask ventilation without difficulty Laryngoscope Size: Miller and 2 Grade View: Grade I Tube type: Oral Tube size: 7.0 mm Number of attempts: 1 Airway Equipment and Method: Stylet Placement Confirmation: ETT inserted through vocal cords under direct vision,  positive ETCO2,  CO2 detector and breath sounds checked- equal and bilateral Secured at: 21 cm Tube secured with: Tape Dental Injury: Teeth and Oropharynx as per pre-operative assessment

## 2015-11-21 NOTE — Anesthesia Postprocedure Evaluation (Signed)
Anesthesia Post Note  Patient: Servando SnareMary Fernholz  Procedure(s) Performed: Procedure(s) (LRB): LAPAROTOMY, RIght Salpingo Oophrectomy (Right)  Patient location during evaluation: PACU Anesthesia Type: General Level of consciousness: awake and alert Pain management: pain level controlled Vital Signs Assessment: post-procedure vital signs reviewed and stable Respiratory status: spontaneous breathing, nonlabored ventilation and respiratory function stable Cardiovascular status: blood pressure returned to baseline and stable Postop Assessment: no signs of nausea or vomiting Anesthetic complications: no     Last Vitals:  Vitals:   11/21/15 1015 11/21/15 1030  BP: (!) 149/95 (!) 156/96  Pulse: 65 66  Resp: 20 16  Temp:  36.9 C    Last Pain:  Vitals:   11/21/15 1030  TempSrc:   PainSc: 6    Pain Goal: Patients Stated Pain Goal: 4 (11/21/15 1030)               Linton RumpJennifer Dickerson Micole Delehanty

## 2015-11-21 NOTE — Anesthesia Postprocedure Evaluation (Signed)
Anesthesia Post Note  Patient: Servando SnareMary Yale  Procedure(s) Performed: Procedure(s) (LRB): LAPAROTOMY, RIght Salpingo Oophrectomy, Right Ovarian Cystectomy (Right)  Patient location during evaluation: [facility] Anesthesia Type: General Level of consciousness: awake and alert Pain management: pain level controlled Vital Signs Assessment: post-procedure vital signs reviewed and stable Respiratory status: spontaneous breathing Cardiovascular status: blood pressure returned to baseline and stable Postop Assessment: no signs of nausea or vomiting and adequate PO intake Anesthetic complications: no     Last Vitals:  Vitals:   11/21/15 1145 11/21/15 1551  BP: (!) 152/92 (!) 132/91  Pulse: 64 79  Resp: 16 18  Temp: 36.8 C 36.4 C    Last Pain:  Vitals:   11/21/15 1551  TempSrc: Oral  PainSc:    Pain Goal: Patients Stated Pain Goal: 4 (11/21/15 1543)               Salome ArntSterling, Treyshawn Muldrew Marie

## 2015-11-22 ENCOUNTER — Encounter (HOSPITAL_COMMUNITY): Payer: Self-pay | Admitting: Obstetrics & Gynecology

## 2015-11-22 LAB — CBC
HCT: 32.2 % — ABNORMAL LOW (ref 36.0–46.0)
HEMOGLOBIN: 11.2 g/dL — AB (ref 12.0–15.0)
MCH: 31.2 pg (ref 26.0–34.0)
MCHC: 34.8 g/dL (ref 30.0–36.0)
MCV: 89.7 fL (ref 78.0–100.0)
Platelets: 270 10*3/uL (ref 150–400)
RBC: 3.59 MIL/uL — AB (ref 3.87–5.11)
RDW: 14.3 % (ref 11.5–15.5)
WBC: 7.6 10*3/uL (ref 4.0–10.5)

## 2015-11-22 MED ORDER — IBUPROFEN 600 MG PO TABS: 600.0000 mg | ORAL_TABLET | Freq: Four times a day (QID) | ORAL | 0 refills | Status: DC | PRN

## 2015-11-22 MED ORDER — OXYCODONE-ACETAMINOPHEN 5-325 MG PO TABS: 1.0000 | ORAL_TABLET | Freq: Four times a day (QID) | ORAL | 0 refills | Status: DC | PRN

## 2015-11-22 NOTE — Discharge Summary (Signed)
Physician Discharge Summary  Patient ID: Shannon Dawson MRN: [id number] DOB/AGE: 12/04/1975 40 y.o.  Admit date: 11/21/2015 Discharge date: 11/22/2015  Admission Diagnoses: right adnexal mass; pelvic pain-female   Discharge Diagnoses:  Principal Problem:   Ovarian cyst Active Problems:   Opioid dependence (HCC)   Alcohol dependence (HCC)   OCD (obsessive compulsive disorder)   Bipolar disorder (HCC)   Generalized anxiety disorder   Post-operative state   Discharged Condition: good  Hospital Course: Patient had an uncomplicated surgery; for further details of this surgery, please refer to the operative note. Furthermore, the patient had an uncomplicated postoperative course.  By time of discharge, her pain was controlled on oral pain medications; she was ambulating, voiding without difficulty, tolerating regular diet and passing flatus.  She was deemed stable for discharge to home.    Consults: None  Significant Diagnostic Studies: labs: CBC  Treatments: surgery: exploratory laparotomy with right salpingo-oophorectomy  Discharge Exam: Blood pressure 102/63, pulse 79, temperature 98 F (36.7 C), temperature source Oral, resp. rate 18, height 5' 1.5" (1.562 m), weight 165 lb (74.8 kg), last menstrual period 10/25/2015, SpO2 99 %. General appearance: alert and no distress Resp: clear to auscultation bilaterally Cardio: regular rate and rhythm, S1, S2 normal, no murmur, click, rub or gallop GI: soft, non-tender; bowel sounds normal; no masses,  no organomegaly Extremities: extremities normal, atraumatic, no cyanosis or edema Incision/Wound:clean; dry and intact.  Pressure dressing removed.  CBC    Component Value Date/Time   WBC 7.6 11/22/2015 0516   RBC 3.59 (L) 11/22/2015 0516   HGB 11.2 (L) 11/22/2015 0516   HCT 32.2 (L) 11/22/2015 0516   PLT 270 11/22/2015 0516   MCV 89.7 11/22/2015 0516   MCH 31.2 11/22/2015 0516   MCHC 34.8 11/22/2015 0516   RDW 14.3 11/22/2015 0516    LYMPHSABS 2.3 07/13/2015 1629   MONOABS 0.4 07/13/2015 1629   EOSABS 0.1 07/13/2015 1629   BASOSABS 0.0 07/13/2015 1629     Disposition: 01-Home or Self Care  Discharge Instructions    Call MD for:  difficulty breathing, headache or visual disturbances    Complete by:  As directed   Call MD for:  extreme fatigue    Complete by:  As directed   Call MD for:  hives    Complete by:  As directed   Call MD for:  persistant dizziness or light-headedness    Complete by:  As directed   Call MD for:  persistant nausea and vomiting    Complete by:  As directed   Call MD for:  redness, tenderness, or signs of infection (pain, swelling, redness, odor or green/yellow discharge around incision site)    Complete by:  As directed   Call MD for:  severe uncontrolled pain    Complete by:  As directed   Call MD for:  temperature >100.4    Complete by:  As directed   Diet - low sodium heart healthy    Complete by:  As directed   Discharge wound care:    Complete by:  As directed   Remove honeycomb dressing in 1 week   Driving Restrictions    Complete by:  As directed   No driving for 2 weeks   Increase activity slowly    Complete by:  As directed   Lifting restrictions    Complete by:  As directed   No heavy lifting for 4 weeks   Sexual Activity Restrictions    Complete by:  As  directed   No sexual activity for 4 weeks       Medication List    STOP taking these medications   acetaminophen 650 MG CR tablet Commonly known as:  TYLENOL   meloxicam 7.5 MG tablet Commonly known as:  MOBIC     TAKE these medications   ALPRAZolam 1 MG tablet Commonly known as:  XANAX Take 1 mg by mouth at bedtime as needed for anxiety.   cyclobenzaprine 5 MG tablet Commonly known as:  FLEXERIL Take 5 mg by mouth 3 (three) times daily as needed for muscle spasms.   ibuprofen 600 MG tablet Commonly known as:  ADVIL,MOTRIN Take 1 tablet (600 mg total) by mouth every 6 (six) hours as needed. What  changed:  medication strength  reasons to take this   oxyCODONE-acetaminophen 5-325 MG tablet Commonly known as:  PERCOCET Take 1-2 tablets by mouth every 6 (six) hours as needed.      Follow-up Information    HARRAWAY-SMITH, Lorrine Killilea, MD Follow up in 2 week(s).   Specialty:  Obstetrics and Gynecology Contact information: [address] [place] [address] [phone]           Signed: Willodean Rosenthal 11/22/2015, 8:56 AM

## 2015-11-22 NOTE — Discharge Instructions (Signed)
Exploratory Laparotomy, Adult, Care After °Refer to this sheet in the next few weeks. These instructions provide you with information about caring for yourself after your procedure. Your health care provider may also give you more specific instructions. Your treatment has been planned according to current medical practices, but problems sometimes occur. Call your health care provider if you have any problems or questions after your procedure. °WHAT TO EXPECT AFTER THE PROCEDURE °After your procedure, it is typical to have: °· Abdominal soreness. °· Fatigue. °· A sore throat from tubes in your throat. °· A lack of appetite. °HOME CARE INSTRUCTIONS °Medicines °· Take medicines only as directed by your health care provider. °· Do not drive or operate heavy machinery while taking pain medicine. °Incision Care °· There are many different ways to close and cover an incision, including stitches (sutures), skin glue, and adhesive strips. Follow your health care provider's instructions about: °¨ Incision care. °¨ Bandage (dressing) changes and removal. °¨ Incision closure removal. °· Do not take showers or baths until your health care provider says that you can. °· Check your incision area daily for signs of infection. Watch for: °¨ Redness. °¨ Tenderness. °¨ Swelling. °¨ Drainage. °Activities °· Do not lift anything that is heavier than 10 pounds (4.5 kg) until your health care provider says that it is safe. °· Try to walk a little bit each day if your health care provider says that it is okay. °· Ask your health care provider when you can start to do your usual activities again, such as driving, going back to work, and having sex. °Eating and Drinking °· You may eat what you usually eat. Include lots of whole grains, fruits, and vegetables in your diet. This will help to prevent constipation. °· Drink enough fluid to keep your urine clear or pale yellow. °General Instructions °· Keep all follow-up visits as directed by  your health care provider. This is important. °SEEK MEDICAL CARE IF:  °· You have a fever. °· You have chills. °· Your pain medicine is not helping. °· You have constipation or diarrhea. °· You have nausea or vomiting. °· You have drainage, redness, swelling, or pain at your incision site. °SEEK IMMEDIATE MEDICAL CARE IF: °· Your pain is getting worse. °· It has been more than 3 days since you been able to have a bowel movement. °· You have ongoing (persistent) vomiting. °· The edges of your incision open up. °· You have warmth, tenderness, and swelling in your calf. °· You have trouble breathing. °· You have chest pain. °  °This information is not intended to replace advice given to you by your health care provider. Make sure you discuss any questions you have with your health care provider. °  °Document Released: 10/17/2003 Document Revised: 03/25/2014 Document Reviewed: 10/20/2013 °Elsevier Interactive Patient Education ©2016 [employer] ° °

## 2015-11-22 NOTE — Progress Notes (Signed)
Pt discharged to home with family.  Pt ambulated to car on her own as she refused to wait for a staff member to be available to accompany her to front door.  Per order, dressing change completed and abdominal binder applied prior to discharge.  No other equipment for  Home ordered at discharge.

## 2015-11-22 NOTE — Plan of Care (Signed)
Problem: Skin Integrity: Goal: Demonstration of wound healing without infection will improve Outcome: Progressing Pt is ambulatory and able to provide personal peri care. Unable to view incision at this time due to surgical pressure drsg still in place. No visible drainage on drsg. Sheryn BisonGordon, Avaleen Brownley Warner

## 2015-12-11 ENCOUNTER — Ambulatory Visit: Payer: Self-pay | Admitting: *Deleted

## 2015-12-11 VITALS — Temp 98.3°F

## 2015-12-11 DIAGNOSIS — Z5189 Encounter for other specified aftercare: Secondary | ICD-10-CM

## 2015-12-11 NOTE — Progress Notes (Signed)
Patient presents to clinic for incision check. Had low transverse laparotomy on 9/5. Incision is well approximated, there is no erythema or edema. No drainage. There is a small pea-sized area of granulation tissue which has been bothering patient, she stated it is painful and bleeds occasionally. Dr Vergie LivingPickens examined incision and applied silver nitrate to site of granulation. Incision lightly covered with abd pad. Instructed patient to gently wash not scrub incision with soapy water daily and return for her scheduled f/u. Understanding voiced.

## 2015-12-21 ENCOUNTER — Ambulatory Visit: Payer: Self-pay | Admitting: Obstetrics & Gynecology

## 2016-06-21 ENCOUNTER — Emergency Department (HOSPITAL_COMMUNITY)
Admission: EM | Admit: 2016-06-21 | Discharge: 2016-06-21 | Disposition: A | Discharge status: Home / Self Care | Payer: Self-pay | Attending: Emergency Medicine | Admitting: Emergency Medicine

## 2016-06-21 ENCOUNTER — Encounter (HOSPITAL_COMMUNITY): Payer: Self-pay | Admitting: Emergency Medicine

## 2016-06-21 DIAGNOSIS — M79661 Pain in right lower leg: Secondary | ICD-10-CM | POA: Insufficient documentation

## 2016-06-21 DIAGNOSIS — Z5321 Procedure and treatment not carried out due to patient leaving prior to being seen by health care provider: Secondary | ICD-10-CM | POA: Insufficient documentation

## 2016-06-21 DIAGNOSIS — F17210 Nicotine dependence, cigarettes, uncomplicated: Secondary | ICD-10-CM | POA: Insufficient documentation

## 2016-06-21 NOTE — ED Notes (Signed)
Called Pt to be roomed back. No response. Third call.

## 2016-06-21 NOTE — ED Notes (Signed)
Called pt for room with no answer 

## 2016-06-21 NOTE — ED Triage Notes (Signed)
Pt sts back pain since working 6 days in a row; pt sts pain down right leg as well and is chronic in nature

## 2016-06-21 NOTE — ED Notes (Signed)
Called pt for triage with no answer  

## 2016-09-04 ENCOUNTER — Emergency Department (HOSPITAL_COMMUNITY)
Admission: EM | Admit: 2016-09-04 | Discharge: 2016-09-04 | Disposition: A | Discharge status: Home / Self Care | Payer: Self-pay | Attending: Emergency Medicine | Admitting: Emergency Medicine

## 2016-09-04 ENCOUNTER — Emergency Department (HOSPITAL_COMMUNITY): Payer: Self-pay

## 2016-09-04 DIAGNOSIS — N939 Abnormal uterine and vaginal bleeding, unspecified: Secondary | ICD-10-CM | POA: Insufficient documentation

## 2016-09-04 DIAGNOSIS — F17210 Nicotine dependence, cigarettes, uncomplicated: Secondary | ICD-10-CM | POA: Insufficient documentation

## 2016-09-04 DIAGNOSIS — R1032 Left lower quadrant pain: Secondary | ICD-10-CM | POA: Insufficient documentation

## 2016-09-04 LAB — URINALYSIS, ROUTINE W REFLEX MICROSCOPIC
Bilirubin Urine: NEGATIVE
GLUCOSE, UA: NEGATIVE mg/dL
Ketones, ur: NEGATIVE mg/dL
Leukocytes, UA: NEGATIVE
Nitrite: NEGATIVE
Protein, ur: NEGATIVE mg/dL
RBC / HPF: NONE SEEN RBC/hpf (ref 0–5)
Specific Gravity, Urine: 1.01 (ref 1.005–1.030)
pH: 6 (ref 5.0–8.0)

## 2016-09-04 LAB — I-STAT BETA HCG BLOOD, ED (MC, WL, AP ONLY)

## 2016-09-04 LAB — COMPREHENSIVE METABOLIC PANEL
ALBUMIN: 3.4 g/dL — AB (ref 3.5–5.0)
ALT: 28 U/L (ref 14–54)
AST: 20 U/L (ref 15–41)
Alkaline Phosphatase: 79 U/L (ref 38–126)
Anion gap: 8 (ref 5–15)
BUN: 12 mg/dL (ref 6–20)
CHLORIDE: 108 mmol/L (ref 101–111)
CO2: 23 mmol/L (ref 22–32)
Calcium: 9 mg/dL (ref 8.9–10.3)
Creatinine, Ser: 0.71 mg/dL (ref 0.44–1.00)
GFR calc Af Amer: >60 mL/min (ref >60)
GFR calc non Af Amer: >60 mL/min (ref >60)
Glucose, Bld: 111 mg/dL — ABNORMAL HIGH (ref 65–99)
Potassium: 3.7 mmol/L (ref 3.5–5.1)
SODIUM: 139 mmol/L (ref 135–145)
Total Bilirubin: 0.3 mg/dL (ref 0.3–1.2)
Total Protein: 7 g/dL (ref 6.5–8.1)

## 2016-09-04 LAB — CBC
HCT: 37.3 % (ref 36.0–46.0)
Hemoglobin: 12.1 g/dL (ref 12.0–15.0)
MCH: 29.4 pg (ref 26.0–34.0)
MCHC: 32.4 g/dL (ref 30.0–36.0)
MCV: 90.5 fL (ref 78.0–100.0)
PLATELETS: 285 10*3/uL (ref 150–400)
RBC: 4.12 MIL/uL (ref 3.87–5.11)
RDW: 17.4 % — AB (ref 11.5–15.5)
WBC: 6.9 10*3/uL (ref 4.0–10.5)

## 2016-09-04 LAB — LIPASE, BLOOD: LIPASE: 23 U/L (ref 11–51)

## 2016-09-04 MED ORDER — OXYCODONE-ACETAMINOPHEN 5-325 MG PO TABS: 1.0000 | ORAL_TABLET | Freq: Four times a day (QID) | ORAL | 0 refills | Status: AC | PRN

## 2016-09-04 MED ORDER — FENTANYL CITRATE (PF) 100 MCG/2ML IJ SOLN
50.0000 ug | Freq: Once | INTRAMUSCULAR | Status: AC
  Administered 2016-09-04: 50 ug via INTRAVENOUS
  Filled 2016-09-04: qty 2

## 2016-09-04 MED ORDER — KETOROLAC TROMETHAMINE 30 MG/ML IJ SOLN
30.0000 mg | Freq: Once | INTRAMUSCULAR | Status: AC
  Administered 2016-09-04: 30 mg via INTRAVENOUS
  Filled 2016-09-04: qty 1

## 2016-09-04 NOTE — ED Notes (Addendum)
Pt reports vaginal bleeding, right and left lower back pain and LLQ abdominal pain for 2 days. LMP 5 months ago due to having her right ovary removed due to a cyst.

## 2016-09-04 NOTE — Discharge Instructions (Signed)
Watch for fevers increasing pain. Your ultrasound was reassuring. There was blood in the urine that could be from the period.

## 2016-09-04 NOTE — ED Provider Notes (Signed)
[facility] DEPT Provider Note   CSN: [id number] Arrival date & time: 09/04/16  9147     History   Chief Complaint Chief Complaint  Patient presents with  . Vaginal Bleeding    HPI Shannon Dawson is a 41 y.o. female.  HPI Patient presents with vaginal bleeding and left lower quadrant pain. States it feels like her previous ovarian cysts. States she had a previous right oophorectomy for cysts. States that she has not had a period since then. States now she started some vaginal bleeding thinks she may have her period. The pain is sharp in her left lower abdomen. Similar to cyst that she's had in the past but normally not this severe. Vaginal bleeding is similar to of menses. No dysuria. No fevers. Worse with movement.   Past Medical History:  Diagnosis Date  . Anemia    hx  . Anxiety   . Chronic lower back pain   . Chronic pain of right knee    post knee injury 2011, pins and wires, rod  . Depression    hx  . GAD (generalized anxiety disorder)   . Headache(784.0)    otc med prn  . History of alcohol abuse    pt verbalized by pt no alcohol since 2011  . History of heroin abuse    per epic documentation and pt detoxed 2011--  verbalized by pt clean since 2011  . History of kidney stones   . Irregular menstrual cycle   . Nephrolithiasis    bilateral non-obstructive per renal u/s 05-27-2015  . OCD (obsessive compulsive disorder)   . Ovarian cyst    BILATERAL  . Renal cyst, right   . Right ureteral stone   . SVD (spontaneous vaginal delivery)    x 2    Patient Active Problem List   Diagnosis Date Noted  . Ovarian cyst 11/21/2015  . Post-operative state 11/21/2015  . Nephrolithiasis 05/19/2015  . OCD (obsessive compulsive disorder) 09/05/2011  . Bipolar disorder (HCC) 09/05/2011  . Generalized anxiety disorder 09/05/2011  . Opioid dependence (HCC) 08/29/2011  . Alcohol dependence (HCC) 08/29/2011    Past Surgical History:  Procedure Laterality Date  . CYSTO/   RIGHT URETEROSCOPIC LASER LITHOTRIPSY STONE EXTRACTION  03-14-2008  . CYSTOSCOPY WITH RETROGRADE PYELOGRAM, URETEROSCOPY AND STENT PLACEMENT Right 05/19/2015   Procedure: CYSTOSCOPY WITH RETROGRADE PYELOGRAM, URETEROSCOPY AND STENT PLACEMENT;  Surgeon: Malen Gauze, MD;  Location: [facility];  Service: Urology;  Laterality: Right;  . CYSTOSCOPY WITH RETROGRADE PYELOGRAM, URETEROSCOPY AND STENT PLACEMENT Right 06/12/2015   Procedure: CYSTOSCOPY WITH RETROGRADE PYELOGRAM, URETEROSCOPY, STONE EXTRACTION AND STENT EXCHANGE;  Surgeon: Malen Gauze, MD;  Location: [facility];  Service: Urology;  Laterality: Right;  . HOLMIUM LASER APPLICATION Right 06/12/2015   Procedure: HOLMIUM LASER APPLICATION;  Surgeon: Malen Gauze, MD;  Location: [facility];  Service: Urology;  Laterality: Right;  . I & D OPEN RIGHT KNEE TIBIA/ FIBULA FX'S/  IM NAILING TIBIA/  CLOSURE TRAUMATIC WOUNDS  07-18-2009  . LAPAROSCOPIC CHOLECYSTECTOMY  05-13-2009  . LAPAROTOMY Right 11/21/2015   Procedure: LAPAROTOMY, RIght Salpingo Oophrectomy, Right Ovarian Cystectomy;  Surgeon: Willodean Rosenthal, MD;  Location: [facility];  Service: Gynecology;  Laterality: Right;  . MULTIPLE TOOTH EXTRACTIONS     cracked and chipped lower teeth, no upper teeth  . ORIF RIGHT PATELLA FX  2013  . WISDOM TOOTH EXTRACTION      OB History    No data available  Home Medications    Prior to Admission medications   Medication Sig Start Date End Date Taking? Authorizing Provider  ibuprofen (ADVIL,MOTRIN) 200 MG tablet Take 200 mg by mouth every 6 (six) hours as needed for moderate pain.   Yes [provider]  ibuprofen (ADVIL,MOTRIN) 600 MG tablet Take 1 tablet (600 mg total) by mouth every 6 (six) hours as needed. Patient not taking: Reported on 09/04/2016 11/22/15   Willodean Rosenthal, MD  oxyCODONE-acetaminophen (PERCOCET/ROXICET) 5-325 MG tablet Take 1-2 tablets by mouth every 6 (six)  hours as needed for severe pain. 09/04/16   Benjiman Core, MD    Family History No family history on file.  Social History Social History  Substance Use Topics  . Smoking status: Current Every Day Smoker    Packs/day: 1.00    Years: 25.00    Types: Cigarettes  . Smokeless tobacco: Never Used  . Alcohol use No     Comment: hx alcohol abuse (per pt no alcohol since 2011)     Allergies   Tramadol and Vicodin [hydrocodone-acetaminophen]   Review of Systems Review of Systems  Constitutional: Negative for appetite change.  HENT: Negative for congestion.   Respiratory: Negative for shortness of breath.   Gastrointestinal: Positive for abdominal pain.  Genitourinary: Positive for pelvic pain and vaginal bleeding.  Musculoskeletal: Negative for back pain.  Neurological: Negative for seizures.  Psychiatric/Behavioral: Negative for confusion.     Physical Exam Updated Vital Signs BP 117/80   Pulse (!) 55   Temp 98.4 F (36.9 C) (Oral)   Resp 16   Ht 5\' 3"  (1.6 m)   Wt 70.3 kg (155 lb)   LMP 04/06/2016   SpO2 99%   BMI 27.46 kg/m   Physical Exam  Constitutional: She appears well-developed.  HENT:  Head: Atraumatic.  Neck: Neck supple.  Cardiovascular: Normal rate.   Pulmonary/Chest: Effort normal.  Abdominal: Soft. There is tenderness.  Mild left lower quadrant tenderness without rebound or guarding.  Musculoskeletal: She exhibits no edema.  Neurological: She is alert.  Skin: Skin is warm. Capillary refill takes less than 2 seconds.     ED Treatments / Results  Labs (all labs ordered are listed, but only abnormal results are displayed) Labs Reviewed  COMPREHENSIVE METABOLIC PANEL - Abnormal; Notable for the following:       Result Value   Glucose, Bld 111 (*)    Albumin 3.4 (*)    All other components within normal limits  CBC - Abnormal; Notable for the following:    RDW 17.4 (*)    All other components within normal limits  URINALYSIS, ROUTINE W  REFLEX MICROSCOPIC - Abnormal; Notable for the following:    Hgb urine dipstick LARGE (*)    Bacteria, UA RARE (*)    Squamous Epithelial / LPF 0-5 (*)    All other components within normal limits  WET PREP, GENITAL  LIPASE, BLOOD  I-STAT BETA HCG BLOOD, ED ([facility], [facility], [facility] ONLY)    EKG  EKG Interpretation None       Radiology US Transvaginal Non-ob  Result Date: 09/04/2016 CLINICAL DATA:  Left lower quadrant pain. exam to visualize the uterus and adnexa. Color and duplex Doppler ultrasound was utilized to evaluate blood flow to the ovaries. EXAM: TRANSABDOMINAL AND TRANSVAGINAL ULTRASOUND OF PELVIS DOPPLER ULTRASOUND OF OVARIES COMPARISON:  09/21/2015 TECHNIQUE: Both transabdominal and transvaginal ultrasound examinations of the pelvis were performed. Transabdominal technique was performed for global imaging of the pelvis including uterus, ovaries,  adnexal regions, and pelvic cul-de-sac. It was necessary to proceed with endovaginal exam following the transabdominal FINDINGS: Uterus Measurements: 8.3 x 4.4 x 5.1 cm. Tiny 5 mm uterine fibroids. 1.3 x 1.3 x 1.8 cm prominent complex cyst in the region of the cervix consistent with an nabothian cyst. Nabothian cyst. Endometrium Thickness: 5.2 mm.  No focal abnormality visualized. Right ovary Prior surgical removal. Left ovary Measurements: 2.6 x 1.5 x 2.1 cm. 1 cm simple cyst. Pulsed Doppler evaluation of the left ovary demonstrates normal low-resistance arterial and venous waveforms. Other findings Small amount of free pelvic fluid. IMPRESSION: 1.  Tiny 5 mm uterine fibroids.  Prominent nabothian cyst. 2. Prior right ovary surgical removal. 1 cm simple cyst left ovary. No evidence of torsion. 3.  Small amount of free pelvic fluid. Electronically Signed   By: Maisie Fushomas  Register   On: 09/04/2016 13:04   [place] Pelvis Complete  Result Date: 09/04/2016 CLINICAL DATA:  Left lower quadrant pain. exam to visualize the uterus and adnexa. Color and duplex Doppler  ultrasound was utilized to evaluate blood flow to the ovaries. EXAM: TRANSABDOMINAL AND TRANSVAGINAL ULTRASOUND OF PELVIS DOPPLER ULTRASOUND OF OVARIES COMPARISON:  09/21/2015 TECHNIQUE: Both transabdominal and transvaginal ultrasound examinations of the pelvis were performed. Transabdominal technique was performed for global imaging of the pelvis including uterus, ovaries, adnexal regions, and pelvic cul-de-sac. It was necessary to proceed with endovaginal exam following the transabdominal FINDINGS: Uterus Measurements: 8.3 x 4.4 x 5.1 cm. Tiny 5 mm uterine fibroids. 1.3 x 1.3 x 1.8 cm prominent complex cyst in the region of the cervix consistent with an nabothian cyst. Nabothian cyst. Endometrium Thickness: 5.2 mm.  No focal abnormality visualized. Right ovary Prior surgical removal. Left ovary Measurements: 2.6 x 1.5 x 2.1 cm. 1 cm simple cyst. Pulsed Doppler evaluation of the left ovary demonstrates normal low-resistance arterial and venous waveforms. Other findings Small amount of free pelvic fluid. IMPRESSION: 1.  Tiny 5 mm uterine fibroids.  Prominent nabothian cyst. 2. Prior right ovary surgical removal. 1 cm simple cyst left ovary. No evidence of torsion. 3.  Small amount of free pelvic fluid. Electronically Signed   By: Maisie Fushomas  Register   On: 09/04/2016 13:04   [place] Art/ven Flow Abd Pelv Doppler  Result Date: 09/04/2016 CLINICAL DATA:  Left lower quadrant pain. exam to visualize the uterus and adnexa. Color and duplex Doppler ultrasound was utilized to evaluate blood flow to the ovaries. EXAM: TRANSABDOMINAL AND TRANSVAGINAL ULTRASOUND OF PELVIS DOPPLER ULTRASOUND OF OVARIES COMPARISON:  09/21/2015 TECHNIQUE: Both transabdominal and transvaginal ultrasound examinations of the pelvis were performed. Transabdominal technique was performed for global imaging of the pelvis including uterus, ovaries, adnexal regions, and pelvic cul-de-sac. It was necessary to proceed with endovaginal exam following the  transabdominal FINDINGS: Uterus Measurements: 8.3 x 4.4 x 5.1 cm. Tiny 5 mm uterine fibroids. 1.3 x 1.3 x 1.8 cm prominent complex cyst in the region of the cervix consistent with an nabothian cyst. Nabothian cyst. Endometrium Thickness: 5.2 mm.  No focal abnormality visualized. Right ovary Prior surgical removal. Left ovary Measurements: 2.6 x 1.5 x 2.1 cm. 1 cm simple cyst. Pulsed Doppler evaluation of the left ovary demonstrates normal low-resistance arterial and venous waveforms. Other findings Small amount of free pelvic fluid. IMPRESSION: 1.  Tiny 5 mm uterine fibroids.  Prominent nabothian cyst. 2. Prior right ovary surgical removal. 1 cm simple cyst left ovary. No evidence of torsion. 3.  Small amount of free pelvic fluid. Electronically Signed   By: Maisie Fushomas  Register   On: 09/04/2016 13:04    Procedures Procedures (including critical care time)  Medications Ordered in ED Medications  ketorolac (TORADOL) 30 MG/ML injection 30 mg (30 mg Intravenous Given 09/04/16 1049)  fentaNYL (SUBLIMAZE) injection 50 mcg (50 mcg Intravenous Given 09/04/16 1342)     Initial Impression / Assessment and Plan / ED Course  I have reviewed the triage vital signs and the nursing notes.  Pertinent labs & imaging results that were available during my care of the patient were reviewed by me and considered in my medical decision making (see chart for details).     Patient with left lower quadrant pain. Labs reassuring. Also new vaginal bleeding. Racing started back on her menses after oophorectomy. Ultrasound does not show cyst or torsion. Patient reassured by this. Defers pelvic exam. Hematuria is either real or could be from her menses. States she has had kidney stones in the past. No urinary tract infection. Will discharge home.  Final Clinical Impressions(s) / ED Diagnoses   Final diagnoses:  LLQ pain  Vaginal bleeding    New Prescriptions New Prescriptions   OXYCODONE-ACETAMINOPHEN (PERCOCET/ROXICET)  5-325 MG TABLET    Take 1-2 tablets by mouth every 6 (six) hours as needed for severe pain.     Benjiman Core, MD 09/04/16 [phone]

## 2016-09-04 NOTE — ED Notes (Signed)
Pelvic cart at bedside. 

## 2016-09-04 NOTE — ED Notes (Signed)
Pt ambulated to RR with no issues, steady gait.

## 2016-09-04 NOTE — ED Notes (Signed)
Pt transported to US

## 2016-09-04 NOTE — ED Notes (Signed)
Pt is in stable condition upon d/c and ambulates from ED. 

## 2016-09-20 ENCOUNTER — Emergency Department (HOSPITAL_COMMUNITY): Payer: Self-pay

## 2016-09-20 ENCOUNTER — Inpatient Hospital Stay (HOSPITAL_COMMUNITY)
Admission: EM | Admit: 2016-09-20 | Discharge: 2016-09-29 | DRG: 853 | Disposition: A | Discharge status: Home / Self Care | Payer: Self-pay | Attending: Internal Medicine | Admitting: Internal Medicine

## 2016-09-20 ENCOUNTER — Encounter (HOSPITAL_COMMUNITY): Payer: Self-pay | Admitting: Emergency Medicine

## 2016-09-20 ENCOUNTER — Inpatient Hospital Stay (HOSPITAL_COMMUNITY): Payer: Self-pay

## 2016-09-20 DIAGNOSIS — F411 Generalized anxiety disorder: Secondary | ICD-10-CM | POA: Diagnosis present

## 2016-09-20 DIAGNOSIS — L03115 Cellulitis of right lower limb: Secondary | ICD-10-CM

## 2016-09-20 DIAGNOSIS — Z885 Allergy status to narcotic agent status: Secondary | ICD-10-CM

## 2016-09-20 DIAGNOSIS — G8929 Other chronic pain: Secondary | ICD-10-CM | POA: Diagnosis present

## 2016-09-20 DIAGNOSIS — B951 Streptococcus, group B, as the cause of diseases classified elsewhere: Secondary | ICD-10-CM | POA: Diagnosis present

## 2016-09-20 DIAGNOSIS — Z87442 Personal history of urinary calculi: Secondary | ICD-10-CM

## 2016-09-20 DIAGNOSIS — L03114 Cellulitis of left upper limb: Secondary | ICD-10-CM | POA: Diagnosis present

## 2016-09-20 DIAGNOSIS — L02413 Cutaneous abscess of right upper limb: Secondary | ICD-10-CM

## 2016-09-20 DIAGNOSIS — B182 Chronic viral hepatitis C: Secondary | ICD-10-CM | POA: Diagnosis present

## 2016-09-20 DIAGNOSIS — B954 Other streptococcus as the cause of diseases classified elsewhere: Secondary | ICD-10-CM | POA: Diagnosis present

## 2016-09-20 DIAGNOSIS — F1120 Opioid dependence, uncomplicated: Secondary | ICD-10-CM | POA: Diagnosis present

## 2016-09-20 DIAGNOSIS — Z888 Allergy status to other drugs, medicaments and biological substances status: Secondary | ICD-10-CM

## 2016-09-20 DIAGNOSIS — R51 Headache: Secondary | ICD-10-CM | POA: Diagnosis present

## 2016-09-20 DIAGNOSIS — L03113 Cellulitis of right upper limb: Secondary | ICD-10-CM

## 2016-09-20 DIAGNOSIS — W1830XA Fall on same level, unspecified, initial encounter: Secondary | ICD-10-CM | POA: Diagnosis present

## 2016-09-20 DIAGNOSIS — F17210 Nicotine dependence, cigarettes, uncomplicated: Secondary | ICD-10-CM | POA: Diagnosis present

## 2016-09-20 DIAGNOSIS — F429 Obsessive-compulsive disorder, unspecified: Secondary | ICD-10-CM | POA: Diagnosis present

## 2016-09-20 DIAGNOSIS — Z79899 Other long term (current) drug therapy: Secondary | ICD-10-CM

## 2016-09-20 DIAGNOSIS — B49 Unspecified mycosis: Secondary | ICD-10-CM | POA: Diagnosis present

## 2016-09-20 DIAGNOSIS — E875 Hyperkalemia: Secondary | ICD-10-CM | POA: Diagnosis present

## 2016-09-20 DIAGNOSIS — L03116 Cellulitis of left lower limb: Secondary | ICD-10-CM

## 2016-09-20 DIAGNOSIS — E876 Hypokalemia: Secondary | ICD-10-CM

## 2016-09-20 DIAGNOSIS — Z79891 Long term (current) use of opiate analgesic: Secondary | ICD-10-CM

## 2016-09-20 DIAGNOSIS — M25511 Pain in right shoulder: Secondary | ICD-10-CM

## 2016-09-20 DIAGNOSIS — N281 Cyst of kidney, acquired: Secondary | ICD-10-CM | POA: Diagnosis present

## 2016-09-20 DIAGNOSIS — W19XXXA Unspecified fall, initial encounter: Secondary | ICD-10-CM

## 2016-09-20 DIAGNOSIS — R7881 Bacteremia: Secondary | ICD-10-CM | POA: Diagnosis present

## 2016-09-20 DIAGNOSIS — L02414 Cutaneous abscess of left upper limb: Secondary | ICD-10-CM

## 2016-09-20 DIAGNOSIS — Z590 Homelessness: Secondary | ICD-10-CM

## 2016-09-20 DIAGNOSIS — B1920 Unspecified viral hepatitis C without hepatic coma: Secondary | ICD-10-CM | POA: Diagnosis present

## 2016-09-20 DIAGNOSIS — L0390 Cellulitis, unspecified: Secondary | ICD-10-CM | POA: Insufficient documentation

## 2016-09-20 DIAGNOSIS — B377 Candidal sepsis: Principal | ICD-10-CM | POA: Diagnosis present

## 2016-09-20 DIAGNOSIS — F319 Bipolar disorder, unspecified: Secondary | ICD-10-CM | POA: Diagnosis present

## 2016-09-20 DIAGNOSIS — M7041 Prepatellar bursitis, right knee: Secondary | ICD-10-CM

## 2016-09-20 LAB — COMPREHENSIVE METABOLIC PANEL
ALBUMIN: 2.5 g/dL — AB (ref 3.5–5.0)
ALT: 17 U/L (ref 14–54)
ANION GAP: 10 (ref 5–15)
AST: 29 U/L (ref 15–41)
Alkaline Phosphatase: 87 U/L (ref 38–126)
BUN: 7 mg/dL (ref 6–20)
CO2: 28 mmol/L (ref 22–32)
Calcium: 8.4 mg/dL — ABNORMAL LOW (ref 8.9–10.3)
Chloride: 90 mmol/L — ABNORMAL LOW (ref 101–111)
Creatinine, Ser: 0.81 mg/dL (ref 0.44–1.00)
GFR calc non Af Amer: >60 mL/min (ref >60)
GLUCOSE: 122 mg/dL — AB (ref 65–99)
Potassium: 2.3 mmol/L — CL (ref 3.5–5.1)
SODIUM: 128 mmol/L — AB (ref 135–145)
Total Bilirubin: 0.9 mg/dL (ref 0.3–1.2)
Total Protein: 6.9 g/dL (ref 6.5–8.1)

## 2016-09-20 LAB — CBC WITH DIFFERENTIAL/PLATELET
BASOS PCT: 0 %
Basophils Absolute: 0 10*3/uL (ref 0.0–0.1)
EOS ABS: 0 10*3/uL (ref 0.0–0.7)
Eosinophils Relative: 0 %
HCT: 33.7 % — ABNORMAL LOW (ref 36.0–46.0)
Hemoglobin: 11.4 g/dL — ABNORMAL LOW (ref 12.0–15.0)
LYMPHS ABS: 1 10*3/uL (ref 0.7–4.0)
Lymphocytes Relative: 9 %
MCH: 28.9 pg (ref 26.0–34.0)
MCHC: 33.8 g/dL (ref 30.0–36.0)
MCV: 85.3 fL (ref 78.0–100.0)
MONO ABS: 0.8 10*3/uL (ref 0.1–1.0)
MONOS PCT: 7 %
NEUTROS PCT: 84 %
Neutro Abs: 8.7 10*3/uL — ABNORMAL HIGH (ref 1.7–7.7)
PLATELETS: 237 10*3/uL (ref 150–400)
RBC: 3.95 MIL/uL (ref 3.87–5.11)
RDW: 16.3 % — AB (ref 11.5–15.5)
WBC: 10.5 10*3/uL (ref 4.0–10.5)

## 2016-09-20 LAB — BASIC METABOLIC PANEL
ANION GAP: 8 (ref 5–15)
BUN: 7 mg/dL (ref 6–20)
CALCIUM: 8.3 mg/dL — AB (ref 8.9–10.3)
CHLORIDE: 93 mmol/L — AB (ref 101–111)
CO2: 30 mmol/L (ref 22–32)
Creatinine, Ser: 0.84 mg/dL (ref 0.44–1.00)
GFR calc non Af Amer: >60 mL/min (ref >60)
Glucose, Bld: 142 mg/dL — ABNORMAL HIGH (ref 65–99)
Potassium: 2.5 mmol/L — CL (ref 3.5–5.1)
SODIUM: 131 mmol/L — AB (ref 135–145)

## 2016-09-20 LAB — URINALYSIS, ROUTINE W REFLEX MICROSCOPIC
Bilirubin Urine: NEGATIVE
GLUCOSE, UA: NEGATIVE mg/dL
KETONES UR: NEGATIVE mg/dL
Nitrite: NEGATIVE
PH: 6 (ref 5.0–8.0)
PROTEIN: 100 mg/dL — AB
Specific Gravity, Urine: 1.011 (ref 1.005–1.030)

## 2016-09-20 LAB — RAPID URINE DRUG SCREEN, HOSP PERFORMED
AMPHETAMINES: POSITIVE — AB
BARBITURATES: NOT DETECTED
BENZODIAZEPINES: NOT DETECTED
COCAINE: NOT DETECTED
Opiates: NOT DETECTED
Tetrahydrocannabinol: NOT DETECTED

## 2016-09-20 LAB — MAGNESIUM: MAGNESIUM: 2 mg/dL (ref 1.7–2.4)

## 2016-09-20 MED ORDER — TETANUS-DIPHTH-ACELL PERTUSSIS 5-2.5-18.5 LF-MCG/0.5 IM SUSP
0.5000 mL | Freq: Once | INTRAMUSCULAR | Status: AC
  Administered 2016-09-20: 0.5 mL via INTRAMUSCULAR
  Filled 2016-09-20: qty 0.5

## 2016-09-20 MED ORDER — SODIUM CHLORIDE 0.9% FLUSH
3.0000 mL | Freq: Two times a day (BID) | INTRAVENOUS | Status: DC
  Administered 2016-09-20 – 2016-09-29 (×12): 3 mL via INTRAVENOUS

## 2016-09-20 MED ORDER — POTASSIUM CHLORIDE CRYS ER 20 MEQ PO TBCR
40.0000 meq | EXTENDED_RELEASE_TABLET | Freq: Once | ORAL | Status: AC
  Administered 2016-09-20: 40 meq via ORAL
  Filled 2016-09-20: qty 2

## 2016-09-20 MED ORDER — SODIUM CHLORIDE 0.9 % IV BOLUS (SEPSIS)
500.0000 mL | Freq: Once | INTRAVENOUS | Status: AC
  Administered 2016-09-20: 500 mL via INTRAVENOUS

## 2016-09-20 MED ORDER — SODIUM CHLORIDE 0.9 % IV SOLN
INTRAVENOUS | Status: DC
  Administered 2016-09-20 – 2016-09-23 (×5): via INTRAVENOUS
  Administered 2016-09-24 – 2016-09-25 (×2): 1000 mL via INTRAVENOUS
  Administered 2016-09-25 – 2016-09-26 (×3): via INTRAVENOUS

## 2016-09-20 MED ORDER — ONDANSETRON HCL 4 MG PO TABS: 4.0000 mg | ORAL_TABLET | Freq: Four times a day (QID) | ORAL | Status: DC | PRN

## 2016-09-20 MED ORDER — ACETAMINOPHEN 325 MG PO TABS
650.0000 mg | ORAL_TABLET | Freq: Once | ORAL | Status: AC
  Administered 2016-09-20: 650 mg via ORAL
  Filled 2016-09-20: qty 2

## 2016-09-20 MED ORDER — CEFAZOLIN SODIUM-DEXTROSE 1-4 GM/50ML-% IV SOLN
1.0000 g | Freq: Three times a day (TID) | INTRAVENOUS | Status: DC
  Administered 2016-09-20 – 2016-09-21 (×2): 1 g via INTRAVENOUS
  Filled 2016-09-20 (×3): qty 50

## 2016-09-20 MED ORDER — DIPHENHYDRAMINE HCL 50 MG/ML IJ SOLN
25.0000 mg | Freq: Once | INTRAMUSCULAR | Status: AC
  Administered 2016-09-20: 25 mg via INTRAVENOUS
  Filled 2016-09-20: qty 1

## 2016-09-20 MED ORDER — ACETAMINOPHEN 650 MG RE SUPP: 650.0000 mg | Freq: Four times a day (QID) | RECTAL | Status: DC | PRN

## 2016-09-20 MED ORDER — POTASSIUM CHLORIDE 10 MEQ/100ML IV SOLN
10.0000 meq | Freq: Once | INTRAVENOUS | Status: AC
Start: 2016-09-20 — End: 2016-09-20
  Administered 2016-09-20: 10 meq via INTRAVENOUS
  Filled 2016-09-20: qty 100

## 2016-09-20 MED ORDER — SODIUM CHLORIDE 0.9 % IV SOLN: 250.0000 mL | INTRAVENOUS | Status: DC | PRN

## 2016-09-20 MED ORDER — DOCUSATE SODIUM 100 MG PO CAPS
100.0000 mg | ORAL_CAPSULE | Freq: Two times a day (BID) | ORAL | Status: DC
  Administered 2016-09-20 – 2016-09-29 (×17): 100 mg via ORAL
  Filled 2016-09-20 (×17): qty 1

## 2016-09-20 MED ORDER — SODIUM CHLORIDE 0.9% FLUSH
3.0000 mL | INTRAVENOUS | Status: DC | PRN
  Administered 2016-09-25: 3 mL via INTRAVENOUS
  Filled 2016-09-20: qty 3

## 2016-09-20 MED ORDER — DEXTROSE 5 % IV SOLN
1.0000 g | Freq: Once | INTRAVENOUS | Status: AC
  Administered 2016-09-20: 1 g via INTRAVENOUS
  Filled 2016-09-20: qty 10

## 2016-09-20 MED ORDER — DOXYCYCLINE HYCLATE 100 MG IV SOLR
100.0000 mg | Freq: Two times a day (BID) | INTRAVENOUS | Status: DC
  Administered 2016-09-20 – 2016-09-21 (×2): 100 mg via INTRAVENOUS
  Filled 2016-09-20 (×2): qty 100

## 2016-09-20 MED ORDER — ACETAMINOPHEN 325 MG PO TABS
650.0000 mg | ORAL_TABLET | Freq: Four times a day (QID) | ORAL | Status: DC | PRN
  Administered 2016-09-22 – 2016-09-23 (×3): 650 mg via ORAL
  Filled 2016-09-20 (×3): qty 2

## 2016-09-20 MED ORDER — KETOROLAC TROMETHAMINE 30 MG/ML IJ SOLN
30.0000 mg | Freq: Four times a day (QID) | INTRAMUSCULAR | Status: AC | PRN
Start: 2016-09-20 — End: 2016-09-25
  Administered 2016-09-20 – 2016-09-25 (×10): 30 mg via INTRAVENOUS
  Filled 2016-09-20 (×10): qty 1

## 2016-09-20 MED ORDER — SENNA 8.6 MG PO TABS
1.0000 | ORAL_TABLET | Freq: Two times a day (BID) | ORAL | Status: DC
  Administered 2016-09-20 – 2016-09-29 (×16): 8.6 mg via ORAL
  Filled 2016-09-20 (×16): qty 1

## 2016-09-20 MED ORDER — ENOXAPARIN SODIUM 40 MG/0.4ML ~~LOC~~ SOLN
40.0000 mg | SUBCUTANEOUS | Status: DC
  Administered 2016-09-20 – 2016-09-22 (×3): 40 mg via SUBCUTANEOUS
  Filled 2016-09-20 (×3): qty 0.4

## 2016-09-20 MED ORDER — MORPHINE SULFATE (PF) 4 MG/ML IV SOLN
6.0000 mg | Freq: Once | INTRAVENOUS | Status: AC
  Administered 2016-09-20: 6 mg via INTRAVENOUS
  Filled 2016-09-20: qty 2

## 2016-09-20 MED ORDER — MORPHINE SULFATE (PF) 4 MG/ML IV SOLN
2.0000 mg | INTRAVENOUS | Status: DC | PRN
  Administered 2016-09-22 – 2016-09-24 (×10): 2 mg via INTRAVENOUS
  Filled 2016-09-20 (×10): qty 1

## 2016-09-20 MED ORDER — LIDOCAINE-EPINEPHRINE (PF) 2 %-1:200000 IJ SOLN
10.0000 mL | Freq: Once | INTRAMUSCULAR | Status: AC
  Administered 2016-09-20: 10 mL via INTRADERMAL
  Filled 2016-09-20: qty 20

## 2016-09-20 MED ORDER — ONDANSETRON HCL 4 MG/2ML IJ SOLN: 4.0000 mg | Freq: Four times a day (QID) | INTRAMUSCULAR | Status: DC | PRN

## 2016-09-20 MED ORDER — OXYCODONE HCL 5 MG PO TABS
5.0000 mg | ORAL_TABLET | ORAL | Status: DC | PRN
  Administered 2016-09-20 – 2016-09-29 (×32): 5 mg via ORAL
  Filled 2016-09-20 (×34): qty 1

## 2016-09-20 NOTE — ED Provider Notes (Signed)
[facility] Provider Note   CSN: [id number] Arrival date & time: 09/20/16  1345     History   Chief Complaint Chief Complaint  Patient presents with  . Knee Pain    HPI Shannon Dawson is a 41 y.o. female.  HPI  Past Medical History:  Diagnosis Date  . Anemia    hx  . Anxiety   . Chronic lower back pain   . Chronic pain of right knee    post knee injury 2011, pins and wires, rod  . Depression    hx  . GAD (generalized anxiety disorder)   . Headache(784.0)    otc med prn  . History of alcohol abuse    pt verbalized by pt no alcohol since 2011  . History of heroin abuse    per epic documentation and pt detoxed 2011--  verbalized by pt clean since 2011  . History of kidney stones   . Irregular menstrual cycle   . Nephrolithiasis    bilateral non-obstructive per renal u/s 05-27-2015  . OCD (obsessive compulsive disorder)   . Ovarian cyst    BILATERAL  . Renal cyst, right   . Right ureteral stone   . SVD (spontaneous vaginal delivery)    x 2    Patient Active Problem List   Diagnosis Date Noted  . Ovarian cyst 11/21/2015  . Post-operative state 11/21/2015  . Nephrolithiasis 05/19/2015  . OCD (obsessive compulsive disorder) 09/05/2011  . Bipolar disorder (HCC) 09/05/2011  . Generalized anxiety disorder 09/05/2011  . Opioid dependence (HCC) 08/29/2011  . Alcohol dependence (HCC) 08/29/2011    Past Surgical History:  Procedure Laterality Date  . CYSTO/  RIGHT URETEROSCOPIC LASER LITHOTRIPSY STONE EXTRACTION  03-14-2008  . CYSTOSCOPY WITH RETROGRADE PYELOGRAM, URETEROSCOPY AND STENT PLACEMENT Right 05/19/2015   Procedure: CYSTOSCOPY WITH RETROGRADE PYELOGRAM, URETEROSCOPY AND STENT PLACEMENT;  Surgeon: Malen Gauze, MD;  Location: [facility];  Service: Urology;  Laterality: Right;  . CYSTOSCOPY WITH RETROGRADE PYELOGRAM, URETEROSCOPY AND STENT PLACEMENT Right 06/12/2015   Procedure: CYSTOSCOPY WITH RETROGRADE PYELOGRAM, URETEROSCOPY, STONE EXTRACTION AND  STENT EXCHANGE;  Surgeon: Malen Gauze, MD;  Location: [facility];  Service: Urology;  Laterality: Right;  . HOLMIUM LASER APPLICATION Right 06/12/2015   Procedure: HOLMIUM LASER APPLICATION;  Surgeon: Malen Gauze, MD;  Location: [facility];  Service: Urology;  Laterality: Right;  . I & D OPEN RIGHT KNEE TIBIA/ FIBULA FX'S/  IM NAILING TIBIA/  CLOSURE TRAUMATIC WOUNDS  07-18-2009  . LAPAROSCOPIC CHOLECYSTECTOMY  05-13-2009  . LAPAROTOMY Right 11/21/2015   Procedure: LAPAROTOMY, RIght Salpingo Oophrectomy, Right Ovarian Cystectomy;  Surgeon: Willodean Rosenthal, MD;  Location: [facility];  Service: Gynecology;  Laterality: Right;  . MULTIPLE TOOTH EXTRACTIONS     cracked and chipped lower teeth, no upper teeth  . ORIF RIGHT PATELLA FX  2013  . WISDOM TOOTH EXTRACTION      OB History    No data available       Home Medications    Prior to Admission medications   Medication Sig Start Date End Date Taking? Authorizing Provider  ibuprofen (ADVIL,MOTRIN) 200 MG tablet Take 200 mg by mouth every 6 (six) hours as needed for moderate pain.    [provider]  ibuprofen (ADVIL,MOTRIN) 600 MG tablet Take 1 tablet (600 mg total) by mouth every 6 (six) hours as needed. Patient not taking: Reported on 09/04/2016 11/22/15   Willodean Rosenthal, MD  oxyCODONE-acetaminophen (PERCOCET/ROXICET) 5-325 MG tablet Take  1-2 tablets by mouth every 6 (six) hours as needed for severe pain. 09/04/16   Benjiman CorePickering, Nathan, MD    Family History History reviewed. No pertinent family history.  Social History Social History  Substance Use Topics  . Smoking status: Current Every Day Smoker    Packs/day: 1.00    Years: 25.00    Types: Cigarettes  . Smokeless tobacco: Never Used  . Alcohol use No     Comment: hx alcohol abuse (per pt no alcohol since 2011)     Allergies   Tramadol and Vicodin [hydrocodone-acetaminophen]   Review of Systems Review of  Systems   Physical Exam Updated Vital Signs BP (!) 143/93 (BP Location: Left Leg)   Pulse (!) 101   Temp 99.6 F (37.6 C) (Oral)   Resp 17   Ht 5\' 3"  (1.6 m)   Wt 65.8 kg (145 lb)   SpO2 100%   BMI 25.69 kg/m   Physical Exam   ED Treatments / Results  Labs (all labs ordered are listed, but only abnormal results are displayed) Labs Reviewed  CULTURE, BLOOD (ROUTINE X 2)  CULTURE, BLOOD (ROUTINE X 2)  AEROBIC CULTURE (SUPERFICIAL SPECIMEN)    EKG  EKG Interpretation None       Radiology Dg Knee Complete 4 Views Right  Result Date: 09/20/2016 CLINICAL DATA:  Right knee inflammation after fall onto anterior patella. Initial encounter. EXAM: RIGHT KNEE - COMPLETE 4+ VIEW COMPARISON:  Tibia radiography 07/18/2009 FINDINGS: Prepatellar soft tissue swelling without opaque foreign body or acute fracture. Status post screw and cerclage wire fixation of the patella. Previous tibial nail fixation of a healed fracture. Healed displaced fibular shaft fracture. Negative for joint effusion or acute fracture. IMPRESSION: 1. Prepatellar swelling without acute osseous finding or opaque foreign body. 2. Remote patella and tibial ORIF. Electronically Signed   By: Marnee SpringJonathon  Watts M.D.   On: 09/20/2016 15:16    Procedures Procedures (including critical care time)  Medications Ordered in ED Medications  cefTRIAXone (ROCEPHIN) 1 g in dextrose 5 % 50 mL IVPB (not administered)     Initial Impression / Assessment and Plan / ED Course  I have reviewed the triage vital signs and the nursing notes.  Pertinent labs & imaging results that were available during my care of the patient were reviewed by me and considered in my medical decision making (see chart for details).     Labs pending,  No fracture on xray.  Pt given IV Rocephin  Final Clinical Impressions(s) / ED Diagnoses   Final diagnoses:  Cellulitis of right arm  Cellulitis of left foot  Cellulitis of right knee    New  Prescriptions New Prescriptions   No medications on file     Osie CheeksSofia, Leslie K, PA-C 09/20/16 1535    Gerhard MunchLockwood, Robert, MD 09/20/16 [phone]

## 2016-09-20 NOTE — ED Triage Notes (Addendum)
Per EMS: pt from home pt fell down embankment yesterday and having right knee pain; pt also has swollen areas to right forearm from admitted IV drug use; PIV L FA 20g

## 2016-09-20 NOTE — ED Provider Notes (Signed)
[facility]-EMERGENCY DEPT Provider Note   CSN: [id number] Arrival date & time: 09/20/16  1345  By signing my name below, I, Shannon Dawson, attest that this documentation has been prepared under the direction and in the presence of Shannon Masker PA-C.  Electronically Signed: Vista Dawson, ED Scribe. 09/20/16. 3:01 PM.   History   Chief Complaint Chief Complaint  Patient presents with  . Knee Pain    HPI HPI Comments: Shannon Dawson is a 41 y.o. female, brought in by ambulance who presents to the Emergency Department s/p a mechanical fall that occurred around 1730 yesterday. Pt was tripped and fell down an embankment yesterday and reports that she struck both of her knees during this incident. Pt notes that she had surgery to her R knee 5 years ago after a patella fracture; pt had hardware placed. She did not hit her head during this incident, no LOC. Pt was able to ambulate immediately after the incident; states she was able to walk about 1 mile before she started having gradual onset pain and swelling to her right knee. Last night she was unable to ambulate normally due to pain and reports that she has only been able to walk to and from the bathroom today. She further reports pain in bilateral knees, worse on the right lower extremity, exacerbated with flexion of the knee. Pt does complain of pain to her left foot which is new since the fall occurred. She also notes pain to her right shoulder and anterior chest wall, exacerbated upon deep inspiration. Pt has not taken any medications in attempt to relieve her symptoms; has not applied ice. She denies any headache, dizziness, changes in vision.   Pt admits to frequent IV drug use for the past 8 years. Currently pt reports a gradually worsening area of pain, swelling and redness to her right forearm. She reports drainage from a small pustule in the same area. Pt's states that she last used IV drugs 8 days ago.   The history is provided by the patient. No  language interpreter was used.    Past Medical History:  Diagnosis Date  . Anemia    hx  . Anxiety   . Chronic lower back pain   . Chronic pain of right knee    post knee injury 2011, pins and wires, rod  . Depression    hx  . GAD (generalized anxiety disorder)   . Headache(784.0)    otc med prn  . History of alcohol abuse    pt verbalized by pt no alcohol since 2011  . History of heroin abuse    per epic documentation and pt detoxed 2011--  verbalized by pt clean since 2011  . History of kidney stones   . Irregular menstrual cycle   . Nephrolithiasis    bilateral non-obstructive per renal u/s 05-27-2015  . OCD (obsessive compulsive disorder)   . Ovarian cyst    BILATERAL  . Renal cyst, right   . Right ureteral stone   . SVD (spontaneous vaginal delivery)    x 2    Patient Active Problem List   Diagnosis Date Noted  . Ovarian cyst 11/21/2015  . Post-operative state 11/21/2015  . Nephrolithiasis 05/19/2015  . OCD (obsessive compulsive disorder) 09/05/2011  . Bipolar disorder (HCC) 09/05/2011  . Generalized anxiety disorder 09/05/2011  . Opioid dependence (HCC) 08/29/2011  . Alcohol dependence (HCC) 08/29/2011    Past Surgical History:  Procedure Laterality Date  . CYSTO/  RIGHT URETEROSCOPIC LASER LITHOTRIPSY  STONE EXTRACTION  03-14-2008  . CYSTOSCOPY WITH RETROGRADE PYELOGRAM, URETEROSCOPY AND STENT PLACEMENT Right 05/19/2015   Procedure: CYSTOSCOPY WITH RETROGRADE PYELOGRAM, URETEROSCOPY AND STENT PLACEMENT;  Surgeon: Malen Gauze, MD;  Location: [facility];  Service: Urology;  Laterality: Right;  . CYSTOSCOPY WITH RETROGRADE PYELOGRAM, URETEROSCOPY AND STENT PLACEMENT Right 06/12/2015   Procedure: CYSTOSCOPY WITH RETROGRADE PYELOGRAM, URETEROSCOPY, STONE EXTRACTION AND STENT EXCHANGE;  Surgeon: Malen Gauze, MD;  Location: [facility];  Service: Urology;  Laterality: Right;  . HOLMIUM LASER APPLICATION Right 06/12/2015   Procedure: HOLMIUM  LASER APPLICATION;  Surgeon: Malen Gauze, MD;  Location: [facility];  Service: Urology;  Laterality: Right;  . I & D OPEN RIGHT KNEE TIBIA/ FIBULA FX'S/  IM NAILING TIBIA/  CLOSURE TRAUMATIC WOUNDS  07-18-2009  . LAPAROSCOPIC CHOLECYSTECTOMY  05-13-2009  . LAPAROTOMY Right 11/21/2015   Procedure: LAPAROTOMY, RIght Salpingo Oophrectomy, Right Ovarian Cystectomy;  Surgeon: Willodean Rosenthal, MD;  Location: [facility];  Service: Gynecology;  Laterality: Right;  . MULTIPLE TOOTH EXTRACTIONS     cracked and chipped lower teeth, no upper teeth  . ORIF RIGHT PATELLA FX  2013  . WISDOM TOOTH EXTRACTION      OB History    No data available       Home Medications    Prior to Admission medications   Medication Sig Start Date End Date Taking? Authorizing Provider  ibuprofen (ADVIL,MOTRIN) 200 MG tablet Take 200 mg by mouth every 6 (six) hours as needed for moderate pain.    [provider]  ibuprofen (ADVIL,MOTRIN) 600 MG tablet Take 1 tablet (600 mg total) by mouth every 6 (six) hours as needed. Patient not taking: Reported on 09/04/2016 11/22/15   Willodean Rosenthal, MD  oxyCODONE-acetaminophen (PERCOCET/ROXICET) 5-325 MG tablet Take 1-2 tablets by mouth every 6 (six) hours as needed for severe pain. 09/04/16   Benjiman Core, MD    Family History History reviewed. No pertinent family history.  Social History Social History  Substance Use Topics  . Smoking status: Current Every Day Smoker    Packs/day: 1.00    Years: 25.00    Types: Cigarettes  . Smokeless tobacco: Never Used  . Alcohol use No     Comment: hx alcohol abuse (per pt no alcohol since 2011)    Allergies   Tramadol and Vicodin [hydrocodone-acetaminophen]   Review of Systems Review of Systems  Eyes: Negative for visual disturbance.  Musculoskeletal: Positive for arthralgias (right knee, chest wall pain), gait problem (pain) and joint swelling.  Skin: Positive for color change  (erythema) and wound (right forearm ).  Neurological: Negative for dizziness, numbness and headaches.    Physical Exam Updated Vital Signs BP (!) 143/93 (BP Location: Left Leg)   Pulse (!) 101   Temp 99.6 F (37.6 C) (Oral)   Resp 17   Ht 5\' 3"  (1.6 m)   Wt 145 lb (65.8 kg)   SpO2 100%   BMI 25.69 kg/m   Physical Exam  Constitutional: She is oriented to person, place, and time. She appears well-developed and well-nourished. No distress.  HENT:  Head: Normocephalic and atraumatic.  Neck: Normal range of motion.  Cardiovascular: Normal rate, regular rhythm, normal heart sounds and intact distal pulses.  Exam reveals no friction rub.   Pulmonary/Chest: Effort normal and breath sounds normal. She has no wheezes.  TTP right anterior chest along area of the upper ribs. Clavicle is slightly tender.   Musculoskeletal: She exhibits edema and  tenderness. She exhibits no deformity.  Swollen right knee. Old T shaped surgical incision that is well healed. Good distal pulses. Oozing pustyle at right wrist.   Neurological: She is alert and oriented to person, place, and time.  Skin: Skin is warm and dry. She is not diaphoretic. There is erythema.  Red swollen area to the left foot that is about 6x8cm with a red streak ascending up left lateral leg. Right forearm is edematous, erythematous and swollen to the elbow  Psychiatric: She has a normal mood and affect. Judgment normal.  Nursing note and vitals reviewed.   ED Treatments / Results  DIAGNOSTIC STUDIES: Oxygen Saturation is 100% on RA, normal by my interpretation.  COORDINATION OF CARE: 2:58 PM-Discussed treatment plan with pt at bedside and pt agreed to plan.   Labs (all labs ordered are listed, but only abnormal results are displayed) Labs Reviewed - No data to display  EKG  EKG Interpretation None       Radiology No results found.  Procedures Procedures (including critical care time)  Medications Ordered in  ED Medications - No data to display   Initial Impression / Assessment and Plan / ED Course  I have reviewed the triage vital signs and the nursing notes.  Pertinent labs & imaging results that were available during my care of the patient were reviewed by me and considered in my medical decision making (see chart for details).     Pt's care turned over to [employer]ie Tran.  Xrays pending  Final Clinical Impressions(s) / ED Diagnoses   Final diagnoses:  Cellulitis of right arm  Cellulitis of left foot  Cellulitis of right knee  Abscess of left forearm  Hypokalemia  Fall  Shoulder pain, right    New Prescriptions New Prescriptions   No medications on file   I personally performed the services in this documentation, which was scribed in my presence.  The recorded information has been reviewed and considered.   Barnet PallKaren SofiaPAC.   Osie CheeksSofia, La Dibella K, PA-C 10/10/16 1727    Jacalyn LefevreHaviland, Julie, MD 10/11/16 [phone]

## 2016-09-20 NOTE — Progress Notes (Signed)
Patient is going off the floor for X-ray

## 2016-09-20 NOTE — H&P (Signed)
Date: 09/20/2016               Patient Name:  Shannon Dawson MRN: [id number]  DOB: 01-06-1976 Age / Sex: 41 y.o., female   PCP: Patient, No Pcp Per         Medical Service: Internal Medicine Teaching Service         Attending Physician: Dr. Doneen Poisson, MD    First Contact: Dr. Caron Presume  Pager: [phone]  Second Contact: Dr. Loney Loh Pager: [phone]       After Hours (After 5p/  First Contact Pager: [phone]  weekends / holidays): Second Contact Pager: [phone]   Chief Complaint: Knee pain   History of Present Illness: Shannon Dawson is a 41 y.o female with history of IV drug use, bipolar disorder, depression, and general anxiety disorder who presents to the ED with knee pain after a fall. Patient states she fell yesterday and hit both of her knees and her R shoulder. She did not hit her head or loose consciousness, and she was able to ambulate after this. The pain in her knees and shoulder has progressively worsened and she is now having difficulty walking. Pain is particularly worse with movement. She did not take anything for pain.   Patient also reports pain in her R forearm that started about 8 days ago. States she was injecting heroin in her R arm and stuck herself multiple times. Her R arm started to get swollen, red, and tender the next day. Redness has extended from the wrist to the elbow over her posterior forearm. States this has happened to her once in the past and she was given oral antibiotics for it. Per ED note, she also presented with a L wrist abscess, which has been I&D by the time we saw her.   She denies fever, chills, SOB, cough, abdominal pain, urinary symptoms and lower extremity edema. She does endorse one episode of diarrhea yesterday. States no bowel movements today since she has not had anything to eat since yesterday due to nausea. Denies emesis.   In the ED, she was afebrile and hemodynamically stable, though with soft BPs. She received rochephin x1, and  tylenol and morphine for pain. XR of the R knee showed only prepatellar swelling. CXR was clear. Blood cx, U cx, and wound cx ordered.   Meds:  Current Meds  Medication Sig  . ibuprofen (ADVIL,MOTRIN) 200 MG tablet Take 200 mg by mouth every 6 (six) hours as needed for moderate pain.  Marland Kitchen ibuprofen (ADVIL,MOTRIN) 600 MG tablet Take 1 tablet (600 mg total) by mouth every 6 (six) hours as needed.  Marland Kitchen oxyCODONE-acetaminophen (PERCOCET/ROXICET) 5-325 MG tablet Take 1-2 tablets by mouth every 6 (six) hours as needed for severe pain.    Allergies: Allergies as of 09/20/2016 - Review Complete 09/20/2016  Allergen Reaction Noted  . Tramadol Hives and Itching 05/18/2015  . Vicodin [hydrocodone-acetaminophen] Itching 08/29/2011    Past Medical History:  Diagnosis Date  . Anemia    hx  . Anxiety   . Chronic lower back pain   . Chronic pain of right knee    post knee injury 2011, pins and wires, rod  . Depression    hx  . GAD (generalized anxiety disorder)   . Headache(784.0)    otc med prn  . History of alcohol abuse    pt verbalized by pt no alcohol since 2011  . History of heroin abuse    per epic documentation and pt  detoxed 2011--  verbalized by pt clean since 2011  . History of kidney stones   . Irregular menstrual cycle   . Nephrolithiasis    bilateral non-obstructive per renal u/s 05-27-2015  . OCD (obsessive compulsive disorder)   . Ovarian cyst    BILATERAL  . Renal cyst, right   . Right ureteral stone   . SVD (spontaneous vaginal delivery)    x 2    Past Surgical History:  Procedure Laterality Date  . CYSTO/  RIGHT URETEROSCOPIC LASER LITHOTRIPSY STONE EXTRACTION  03-14-2008  . CYSTOSCOPY WITH RETROGRADE PYELOGRAM, URETEROSCOPY AND STENT PLACEMENT Right 05/19/2015   Procedure: CYSTOSCOPY WITH RETROGRADE PYELOGRAM, URETEROSCOPY AND STENT PLACEMENT;  Surgeon: Malen Gauze, MD;  Location: [facility];  Service: Urology;  Laterality: Right;  . CYSTOSCOPY WITH RETROGRADE  PYELOGRAM, URETEROSCOPY AND STENT PLACEMENT Right 06/12/2015   Procedure: CYSTOSCOPY WITH RETROGRADE PYELOGRAM, URETEROSCOPY, STONE EXTRACTION AND STENT EXCHANGE;  Surgeon: Malen Gauze, MD;  Location[facility];  Service: Urology;  Laterality: Right;  . HOLMIUM LASER APPLICATION Right 06/12/2015   Procedure: HOLMIUM LASER APPLICATION;  Surgeon: Malen Gauze, MD;  Location: [facility];  Service: Urology;  Laterality: Right;  . I & D OPEN RIGHT KNEE TIBIA/ FIBULA FX'S/  IM NAILING TIBIA/  CLOSURE TRAUMATIC WOUNDS  07-18-2009  . LAPAROSCOPIC CHOLECYSTECTOMY  05-13-2009  . LAPAROTOMY Right 11/21/2015   Procedure: LAPAROTOMY, RIght Salpingo Oophrectomy, Right Ovarian Cystectomy;  Surgeon: Willodean Rosenthal, MD;  Location: [facility];  Service: Gynecology;  Laterality: Right;  . MULTIPLE TOOTH EXTRACTIONS     cracked and chipped lower teeth, no upper teeth  . ORIF RIGHT PATELLA FX  2013  . WISDOM TOOTH EXTRACTION      Family History: Unknown. Patient is adopted.   Social History:  Patient has been living in [place] for 2 years. Previously lived in [place]. Currently unemployed and living with friends. She smokes ~1ppd. The last time she smoked was 5 days ago. She denies current  alcohol use, though she has a history of alcohol abuse in the past (clean since 2011 per chart though did not confirmed). Has been using heroin for the past 4 years ago (8 years per chart). Denies sharing needles, but unsure if she cleans them between uses. She was able to quit 2 years ago, went to rehabilitation for this. Has used methadone in the past. She is interested on quitting again.    Review of Systems: A complete ROS was negative except as per HPI.   Physical Exam: Blood pressure (!) 96/58, pulse 68, temperature 98.1 F (36.7 C), temperature source Oral, resp. rate 19, height 5\' 3"  (1.6 m), weight 145 lb (65.8 kg), SpO2 99 %.  General: pleasant female,  well-nourished, well-developed, in pain but in no acute distress  HENT: NCAT, neck supple and FROM  Eyes: anicteric sclera, PERRL Cardiac: regular rate and rhythm, nl S1/S2, no murmurs, rubs or gallops  Pulm: diffuse inspiratory crackles, no increased work of breathing  Abd: soft, NTND, hypoactive bowel sounds Neuro: A&Ox3, motor strength and sensation intact on L upper and lower extremities, unable to assess range of motion R upper and lower extremities due to pain  Ext: R knee is warm to the touch, erythematous and tender to palpation, otherwise extremities warm and well perfused, no peripheral edema, 2+ DP pulses bilaterally, no lesions observed in palms or soles  Derm: R forearm erythematous, swollen and tender to palpation, no induration or fluctuance, L wrist/forearm covered  with dressing after I&D, excoriations in her L knee secondary to fall, t-shaped surgical scar noted over R knee, multiple needle track marks noted over both upper extremities, no splinter hemorrhages noted  Psych: attentive, appropriate affect, answers questions appropriately    EKG: personally reviewed my interpretation is sinus rhythm at 82bpm, nl intervals, no axis deviation, no ischemic changes    CXR: personally reviewed my interpretation is patent airway, no bony abnormalities, no focal consolidations, opacities, effusions or edema   Assessment & Plan by Problem:  Shannon Dawson is a 41 y.o female with history of IV drug use, bipolar disorder, depression, and general anxiety disorder who presents to the ED with knee pain after a fall and found to have nonpurulent  cellulitis of L forearm and R forearm abscess.  1. Nonpurulent cellulitis of R forearm: R forearm tender, swollen and with erythema for the past 8 days after sticking herself multiple times with needle while trying to inject heroin. Does not share needles, but does not always clean them between uses.  Initially normotensive in the ED, but with soft BP  when seen (sBP 95-115). VS stable otherwise and she  denies systemic symptoms. Unlikely to be bacteremic given HDS and nl white count, but will start IV antibiotic therapy and add MRSA coverage given hx of IVDU and hardware in her left lower extremity. No concern for infective endocarditis at this time given patient is HDS, has a normal heart exam and no findings consistent with IE on exam.  - Cellulitic area outlined and dated  - S/p rocephin x1 in the ED  - Start IV cefazolin 1g q8h  - Start IV doxycycline 100mg  q12h for MRSA coverage given risk factors  - Blood cx x2 + U cx sent, follow-up  - Consider TTE if concern for infective endocarditis   2. Abscess of L forearm:  - I&D in the ED - Wound cx sent, follow-up   3. R knee pain: started yesterday after the fall. Currently erythematous and warm to the touch. Do not suspect a septic joint at this time. Will continue to monitor.  - Erythematous area outlined and dated - Continue to assess   4. R shoulder pain secondary to fall: - XR shoulder  - Pain control: Tylenol 650mg  PRN + IV toradol 30mg  q6h PRN + morphine 2mg  q4h PRN + oxycodone 5mg  q4h PRN  - Colace and senokot ordered   5. Hypokalemia: K 2.3 on admission. K 2.5 after repletion with 40mg  kdur PO + IV K  - Repeat BMP in AM   6. Nausea:  - IV zofran  4mg  PRN   7. Hypokalemia: K 2.3 in the ED  - s/p Kdur 40mg  x1 and IV K x1  - Will repeat BMP now and in AM   8. History of IV drug use: Uses heroin. Last used 8 days ago. Patient interested in rehab.  Does not share needles, but does not always clean them between uses. See above for further details. +HCV antibody in 2013 - UDS positive for amphetamines - Social work consult  - HCV RNA and HIV pending   9. Tobacco abuse: smokes ~1ppd - Nicotine patch 14mg    10. General anxiety disorder: not on medication for it   11. Depression: not on medication for it    12. Asymptomatic bacteuria: UA dirty with leukocytes,  negative for nitrites. No urinary symptoms.   IVF: NS 100cc/hr  Diet: regular  DVT ppx: SQ lovenox  Code status:  Full code   Dispo: Admit patient to Inpatient with expected length of stay greater than 2 midnights.  Signed: Burna CashIdalys Santos-Sanchez, MD  Internal Medicine PGY-1  P [phone] 09/20/2016, 9:27 PM

## 2016-09-20 NOTE — ED Provider Notes (Signed)
Received sign out at the beginning of shift.  Pt here with pain to her R forearm, L wrist, R clavicle region and R knee.  She has significant cellulitis to R forearm from IVDU.  She also has a cutaneous abscess to L distal forearm/wrist region on the dorsal aspect.  She has evidence of cellulitis to her R knee, without evidence of septic joint. Her UA is positive for UTI but pt denies sxs.  She is not UTD with tetanus, will give a tdap.  She does have subjective fever/chills.  SInce pt has significant hx of IV drug abuse and has multiple sources of infection, she will benefit admission for further management of her condition.    Plan to perform I&D of L forearm cutaneous abscess as well.    6:08 PM INCISION AND DRAINAGE Performed by: Fayrene Helper Consent: Verbal consent obtained. Risks and benefits: risks, benefits and alternatives were discussed Type: abscess  Body area: L forearm  Anesthesia: local infiltration  Incision was made with a scalpel.  Local anesthetic: lidocaine 2% 2 epinephrine  Anesthetic total: 3 ml  Complexity: complex Blunt dissection to break up loculations  Drainage: purulent  Drainage amount: moderate  Packing material: none  Patient tolerance: Patient tolerated the procedure well with no immediate complications.   6:13 PM Pt has a potassium of 2.3.  EKG without concerning changes.  Sodium is 128. IVF, abx given.  Pt has L forearm abscess that was incised and drained by me.  Plan to admit for further care.    6:51 PM Appreciate consultation from our Internal Medicine resident who will see pt in the ER and will admit for further care.    BP 117/68   Pulse 81   Temp 99.5 F (37.5 C) (Rectal)   Resp 17   Ht 5\' 3"  (1.6 m)   Wt 65.8 kg (145 lb)   SpO2 96%   BMI 25.69 kg/m   Results for orders placed or performed during the hospital encounter of 09/20/16  CBC with Differential/Platelet  Result Value Ref Range   WBC 10.5 4.0 - 10.5 K/uL   RBC 3.95  3.87 - 5.11 MIL/uL   Hemoglobin 11.4 (L) 12.0 - 15.0 g/dL   HCT 16.1 (L) 09.6 - 04.5 %   MCV 85.3 78.0 - 100.0 fL   MCH 28.9 26.0 - 34.0 pg   MCHC 33.8 30.0 - 36.0 g/dL   RDW 40.9 (H) 81.1 - 91.4 %   Platelets 237 150 - 400 K/uL   Neutrophils Relative % 84 %   Neutro Abs 8.7 (H) 1.7 - 7.7 K/uL   Lymphocytes Relative 9 %   Lymphs Abs 1.0 0.7 - 4.0 K/uL   Monocytes Relative 7 %   Monocytes Absolute 0.8 0.1 - 1.0 K/uL   Eosinophils Relative 0 %   Eosinophils Absolute 0.0 0.0 - 0.7 K/uL   Basophils Relative 0 %   Basophils Absolute 0.0 0.0 - 0.1 K/uL  Comprehensive metabolic panel  Result Value Ref Range   Sodium 128 (L) 135 - 145 mmol/L   Potassium 2.3 (LL) 3.5 - 5.1 mmol/L   Chloride 90 (L) 101 - 111 mmol/L   CO2 28 22 - 32 mmol/L   Glucose, Bld 122 (H) 65 - 99 mg/dL   BUN 7 6 - 20 mg/dL   Creatinine, Ser 7.82 0.44 - 1.00 mg/dL   Calcium 8.4 (L) 8.9 - 10.3 mg/dL   Total Protein 6.9 6.5 - 8.1 g/dL   Albumin 2.5 (L)  3.5 - 5.0 g/dL   AST 29 15 - 41 U/L   ALT 17 14 - 54 U/L   Alkaline Phosphatase 87 38 - 126 U/L   Total Bilirubin 0.9 0.3 - 1.2 mg/dL   GFR calc non Af Amer >60 >60 mL/min   GFR calc Af Amer >60 >60 mL/min   Anion gap 10 5 - 15  Rapid urine drug screen (hospital performed)  Result Value Ref Range   Opiates NONE DETECTED NONE DETECTED   Cocaine NONE DETECTED NONE DETECTED   Benzodiazepines NONE DETECTED NONE DETECTED   Amphetamines POSITIVE (A) NONE DETECTED   Tetrahydrocannabinol NONE DETECTED NONE DETECTED   Barbiturates NONE DETECTED NONE DETECTED  Urinalysis, Routine w reflex microscopic  Result Value Ref Range   Color, Urine AMBER (A) YELLOW   APPearance CLOUDY (A) CLEAR   Specific Gravity, Urine 1.011 1.005 - 1.030   pH 6.0 5.0 - 8.0   Glucose, UA NEGATIVE NEGATIVE mg/dL   Hgb urine dipstick MODERATE (A) NEGATIVE   Bilirubin Urine NEGATIVE NEGATIVE   Ketones, ur NEGATIVE NEGATIVE mg/dL   Protein, ur 409100 (A) NEGATIVE mg/dL   Nitrite NEGATIVE  NEGATIVE   Leukocytes, UA LARGE (A) NEGATIVE   RBC / HPF 0-5 0 - 5 RBC/hpf   WBC, UA TOO NUMEROUS TO COUNT 0 - 5 WBC/hpf   Bacteria, UA MANY (A) NONE SEEN   Squamous Epithelial / LPF 0-5 (A) NONE SEEN   Mucous PRESENT    Dg Chest 2 View  Result Date: 09/20/2016 CLINICAL DATA:  Right upper chest pain after fall last night. EXAM: CHEST  2 VIEW COMPARISON:  07/19/2009 FINDINGS: AP and lateral views of the chest show no focal airspace consolidation. No pulmonary edema or pleural effusion. No pneumothorax. The cardiopericardial silhouette is within normal limits for size. The visualized bony structures of the thorax are intact. IMPRESSION: No active cardiopulmonary disease. Electronically Signed   By: Kennith CenterEric  Mansell M.D.   On: 09/20/2016 15:21   [place] Transvaginal Non-ob  Result Date: 09/04/2016 CLINICAL DATA:  Left lower quadrant pain. exam to visualize the uterus and adnexa. Color and duplex Doppler ultrasound was utilized to evaluate blood flow to the ovaries. EXAM: TRANSABDOMINAL AND TRANSVAGINAL ULTRASOUND OF PELVIS DOPPLER ULTRASOUND OF OVARIES COMPARISON:  09/21/2015 TECHNIQUE: Both transabdominal and transvaginal ultrasound examinations of the pelvis were performed. Transabdominal technique was performed for global imaging of the pelvis including uterus, ovaries, adnexal regions, and pelvic cul-de-sac. It was necessary to proceed with endovaginal exam following the transabdominal FINDINGS: Uterus Measurements: 8.3 x 4.4 x 5.1 cm. Tiny 5 mm uterine fibroids. 1.3 x 1.3 x 1.8 cm prominent complex cyst in the region of the cervix consistent with an nabothian cyst. Nabothian cyst. Endometrium Thickness: 5.2 mm.  No focal abnormality visualized. Right ovary Prior surgical removal. Left ovary Measurements: 2.6 x 1.5 x 2.1 cm. 1 cm simple cyst. Pulsed Doppler evaluation of the left ovary demonstrates normal low-resistance arterial and venous waveforms. Other findings Small amount of free pelvic fluid.  IMPRESSION: 1.  Tiny 5 mm uterine fibroids.  Prominent nabothian cyst. 2. Prior right ovary surgical removal. 1 cm simple cyst left ovary. No evidence of torsion. 3.  Small amount of free pelvic fluid. Electronically Signed   By: Maisie Fushomas  Register   On: 09/04/2016 13:04   [place] Pelvis Complete  Result Date: 09/04/2016 CLINICAL DATA:  Left lower quadrant pain. exam to visualize the uterus and adnexa. Color and duplex Doppler ultrasound was utilized to evaluate blood  flow to the ovaries. EXAM: TRANSABDOMINAL AND TRANSVAGINAL ULTRASOUND OF PELVIS DOPPLER ULTRASOUND OF OVARIES COMPARISON:  09/21/2015 TECHNIQUE: Both transabdominal and transvaginal ultrasound examinations of the pelvis were performed. Transabdominal technique was performed for global imaging of the pelvis including uterus, ovaries, adnexal regions, and pelvic cul-de-sac. It was necessary to proceed with endovaginal exam following the transabdominal FINDINGS: Uterus Measurements: 8.3 x 4.4 x 5.1 cm. Tiny 5 mm uterine fibroids. 1.3 x 1.3 x 1.8 cm prominent complex cyst in the region of the cervix consistent with an nabothian cyst. Nabothian cyst. Endometrium Thickness: 5.2 mm.  No focal abnormality visualized. Right ovary Prior surgical removal. Left ovary Measurements: 2.6 x 1.5 x 2.1 cm. 1 cm simple cyst. Pulsed Doppler evaluation of the left ovary demonstrates normal low-resistance arterial and venous waveforms. Other findings Small amount of free pelvic fluid. IMPRESSION: 1.  Tiny 5 mm uterine fibroids.  Prominent nabothian cyst. 2. Prior right ovary surgical removal. 1 cm simple cyst left ovary. No evidence of torsion. 3.  Small amount of free pelvic fluid. Electronically Signed   By: Maisie Fus  Register   On: 09/04/2016 13:04   [place] Art/ven Flow Abd Pelv Doppler  Result Date: 09/04/2016 CLINICAL DATA:  Left lower quadrant pain. exam to visualize the uterus and adnexa. Color and duplex Doppler ultrasound was utilized to evaluate blood flow to the  ovaries. EXAM: TRANSABDOMINAL AND TRANSVAGINAL ULTRASOUND OF PELVIS DOPPLER ULTRASOUND OF OVARIES COMPARISON:  09/21/2015 TECHNIQUE: Both transabdominal and transvaginal ultrasound examinations of the pelvis were performed. Transabdominal technique was performed for global imaging of the pelvis including uterus, ovaries, adnexal regions, and pelvic cul-de-sac. It was necessary to proceed with endovaginal exam following the transabdominal FINDINGS: Uterus Measurements: 8.3 x 4.4 x 5.1 cm. Tiny 5 mm uterine fibroids. 1.3 x 1.3 x 1.8 cm prominent complex cyst in the region of the cervix consistent with an nabothian cyst. Nabothian cyst. Endometrium Thickness: 5.2 mm.  No focal abnormality visualized. Right ovary Prior surgical removal. Left ovary Measurements: 2.6 x 1.5 x 2.1 cm. 1 cm simple cyst. Pulsed Doppler evaluation of the left ovary demonstrates normal low-resistance arterial and venous waveforms. Other findings Small amount of free pelvic fluid. IMPRESSION: 1.  Tiny 5 mm uterine fibroids.  Prominent nabothian cyst. 2. Prior right ovary surgical removal. 1 cm simple cyst left ovary. No evidence of torsion. 3.  Small amount of free pelvic fluid. Electronically Signed   By: Maisie Fus  Register   On: 09/04/2016 13:04   Dg Knee Complete 4 Views Right  Result Date: 09/20/2016 CLINICAL DATA:  Right knee inflammation after fall onto anterior patella. Initial encounter. EXAM: RIGHT KNEE - COMPLETE 4+ VIEW COMPARISON:  Tibia radiography 07/18/2009 FINDINGS: Prepatellar soft tissue swelling without opaque foreign body or acute fracture. Status post screw and cerclage wire fixation of the patella. Previous tibial nail fixation of a healed fracture. Healed displaced fibular shaft fracture. Negative for joint effusion or acute fracture. IMPRESSION: 1. Prepatellar swelling without acute osseous finding or opaque foreign body. 2. Remote patella and tibial ORIF. Electronically Signed   By: Marnee Spring M.D.   On: 09/20/2016  15:16      Fayrene Helper, PA-C 09/20/16 1851    Bethann Berkshire, MD 09/20/16 [phone]

## 2016-09-21 DIAGNOSIS — L03113 Cellulitis of right upper limb: Secondary | ICD-10-CM

## 2016-09-21 DIAGNOSIS — B49 Unspecified mycosis: Secondary | ICD-10-CM | POA: Diagnosis present

## 2016-09-21 DIAGNOSIS — B182 Chronic viral hepatitis C: Secondary | ICD-10-CM

## 2016-09-21 DIAGNOSIS — F1120 Opioid dependence, uncomplicated: Secondary | ICD-10-CM

## 2016-09-21 DIAGNOSIS — F17210 Nicotine dependence, cigarettes, uncomplicated: Secondary | ICD-10-CM

## 2016-09-21 DIAGNOSIS — Z79899 Other long term (current) drug therapy: Secondary | ICD-10-CM

## 2016-09-21 DIAGNOSIS — M25511 Pain in right shoulder: Secondary | ICD-10-CM

## 2016-09-21 DIAGNOSIS — Z888 Allergy status to other drugs, medicaments and biological substances status: Secondary | ICD-10-CM

## 2016-09-21 DIAGNOSIS — L02414 Cutaneous abscess of left upper limb: Secondary | ICD-10-CM

## 2016-09-21 DIAGNOSIS — M25561 Pain in right knee: Secondary | ICD-10-CM

## 2016-09-21 DIAGNOSIS — R7881 Bacteremia: Secondary | ICD-10-CM | POA: Diagnosis present

## 2016-09-21 DIAGNOSIS — L03114 Cellulitis of left upper limb: Secondary | ICD-10-CM

## 2016-09-21 LAB — BASIC METABOLIC PANEL
Anion gap: 11 (ref 5–15)
Anion gap: 12 (ref 5–15)
BUN: 10 mg/dL (ref 6–20)
BUN: 9 mg/dL (ref 6–20)
CALCIUM: 8.2 mg/dL — AB (ref 8.9–10.3)
CO2: 22 mmol/L (ref 22–32)
CO2: 19 mmol/L — AB (ref 22–32)
CREATININE: 0.97 mg/dL (ref 0.44–1.00)
CREATININE: 0.75 mg/dL (ref 0.44–1.00)
Calcium: 8.7 mg/dL — ABNORMAL LOW (ref 8.9–10.3)
Chloride: 100 mmol/L — ABNORMAL LOW (ref 101–111)
Chloride: 103 mmol/L (ref 101–111)
GFR calc Af Amer: >60 mL/min (ref >60)
GFR calc non Af Amer: >60 mL/min (ref >60)
GFR calc non Af Amer: >60 mL/min (ref >60)
GLUCOSE: 122 mg/dL — AB (ref 65–99)
Glucose, Bld: 173 mg/dL — ABNORMAL HIGH (ref 65–99)
Potassium: 2.8 mmol/L — ABNORMAL LOW (ref 3.5–5.1)
Potassium: 3.8 mmol/L (ref 3.5–5.1)
Sodium: 133 mmol/L — ABNORMAL LOW (ref 135–145)
Sodium: 134 mmol/L — ABNORMAL LOW (ref 135–145)

## 2016-09-21 LAB — CBC
HCT: 25.8 % — ABNORMAL LOW (ref 36.0–46.0)
Hemoglobin: 8.6 g/dL — ABNORMAL LOW (ref 12.0–15.0)
MCH: 28.9 pg (ref 26.0–34.0)
MCHC: 33.3 g/dL (ref 30.0–36.0)
MCV: 86.6 fL (ref 78.0–100.0)
PLATELETS: 253 10*3/uL (ref 150–400)
RBC: 2.98 MIL/uL — AB (ref 3.87–5.11)
RDW: 16.9 % — ABNORMAL HIGH (ref 11.5–15.5)
WBC: 9.4 10*3/uL (ref 4.0–10.5)

## 2016-09-21 LAB — BLOOD CULTURE ID PANEL (REFLEXED)
Acinetobacter baumannii: NOT DETECTED
Candida albicans: NOT DETECTED
Candida glabrata: NOT DETECTED
Candida krusei: DETECTED — AB
Candida parapsilosis: NOT DETECTED
Candida tropicalis: NOT DETECTED
ENTEROCOCCUS SPECIES: NOT DETECTED
ESCHERICHIA COLI: NOT DETECTED
Enterobacter cloacae complex: NOT DETECTED
Enterobacteriaceae species: NOT DETECTED
HAEMOPHILUS INFLUENZAE: NOT DETECTED
Klebsiella oxytoca: NOT DETECTED
Klebsiella pneumoniae: NOT DETECTED
LISTERIA MONOCYTOGENES: NOT DETECTED
Neisseria meningitidis: NOT DETECTED
PSEUDOMONAS AERUGINOSA: NOT DETECTED
Proteus species: NOT DETECTED
SERRATIA MARCESCENS: NOT DETECTED
STAPHYLOCOCCUS AUREUS BCID: NOT DETECTED
STREPTOCOCCUS AGALACTIAE: NOT DETECTED
STREPTOCOCCUS PNEUMONIAE: NOT DETECTED
STREPTOCOCCUS PYOGENES: DETECTED — AB
STREPTOCOCCUS SPECIES: DETECTED — AB
Staphylococcus species: NOT DETECTED

## 2016-09-21 MED ORDER — POTASSIUM CHLORIDE 10 MEQ/100ML IV SOLN
10.0000 meq | INTRAVENOUS | Status: DC
  Filled 2016-09-21 (×6): qty 100

## 2016-09-21 MED ORDER — POTASSIUM CHLORIDE CRYS ER 20 MEQ PO TBCR
40.0000 meq | EXTENDED_RELEASE_TABLET | Freq: Once | ORAL | Status: AC
  Administered 2016-09-21: 40 meq via ORAL
  Filled 2016-09-21: qty 2

## 2016-09-21 MED ORDER — ANIDULAFUNGIN 100 MG IV SOLR
200.0000 mg | Freq: Once | INTRAVENOUS | Status: AC
  Administered 2016-09-21: 200 mg via INTRAVENOUS
  Filled 2016-09-21: qty 200

## 2016-09-21 MED ORDER — CEFAZOLIN SODIUM-DEXTROSE 2-4 GM/100ML-% IV SOLN
2.0000 g | Freq: Three times a day (TID) | INTRAVENOUS | Status: DC
  Administered 2016-09-21 – 2016-09-23 (×7): 2 g via INTRAVENOUS
  Filled 2016-09-21 (×7): qty 100

## 2016-09-21 MED ORDER — SODIUM CHLORIDE 0.9 % IV SOLN
2.0000 g | Freq: Once | INTRAVENOUS | Status: AC
  Administered 2016-09-21: 2 g via INTRAVENOUS
  Filled 2016-09-21: qty 20

## 2016-09-21 MED ORDER — POTASSIUM CHLORIDE CRYS ER 20 MEQ PO TBCR: 40.0000 meq | EXTENDED_RELEASE_TABLET | Freq: Once | ORAL | Status: DC

## 2016-09-21 MED ORDER — NICOTINE 14 MG/24HR TD PT24
14.0000 mg | MEDICATED_PATCH | Freq: Every day | TRANSDERMAL | Status: DC
  Administered 2016-09-21 – 2016-09-29 (×9): 14 mg via TRANSDERMAL
  Filled 2016-09-21 (×10): qty 1

## 2016-09-21 MED ORDER — SODIUM CHLORIDE 0.9 % IV SOLN
100.0000 mg | INTRAVENOUS | Status: DC
  Administered 2016-09-22 – 2016-09-25 (×4): 100 mg via INTRAVENOUS
  Filled 2016-09-21 (×6): qty 100

## 2016-09-21 MED ORDER — DEXTROSE 5 % IV SOLN
1.0000 g | Freq: Once | INTRAVENOUS | Status: DC
  Filled 2016-09-21: qty 10

## 2016-09-21 NOTE — Evaluation (Signed)
Physical Therapy Evaluation Patient Details Name: Shannon SnareMary Mozingo MRN: [id number] DOB: 1976/01/23 Today's Date: 09/21/2016   History of Present Illness  41 y.o female with a PMHx significant for IV drug abuse who presented to the ED with right arm cellulitis and a left arm abscess.   Clinical Impression  Pt admitted with above diagnosis. Pt currently with functional limitations due to the deficits listed below (see PT Problem List). Pt is mod I for bed mobility and transfers and supervision for ambulation of 30 feet with crutches. Pt will benefit from skilled PT to increase their independence and safety with mobility to allow discharge to the venue listed below.       Follow Up Recommendations No PT follow up    Equipment Recommendations  Crutches    Recommendations for Other Services       Precautions / Restrictions Precautions Precautions: None Restrictions Weight Bearing Restrictions: No      Mobility  Bed Mobility Overal bed mobility: Modified Independent             General bed mobility comments: Increased time for sit>supine  Transfers Overall transfer level: Modified independent               General transfer comment: Increased time to perform sit<>stand due to pain  Ambulation/Gait Ambulation/Gait assistance: Supervision Ambulation Distance (Feet): 25 Feet Assistive device: Crutches Gait Pattern/deviations: Step-through pattern;Decreased weight shift to right;Decreased stance time - right Gait velocity: slowed Gait velocity interpretation: Below normal speed for age/gender General Gait Details: supervision for safety, vc for using UE support to decrease weightbearing through R LE pt with safe steady gait with decreased pain with use of crutches   Stairs            Wheelchair Mobility    Modified Rankin (Stroke Patients Only)       Balance Overall balance assessment: No apparent balance deficits (not formally assessed)                                            Pertinent Vitals/Pain Pain Assessment: Faces Faces Pain Scale: Hurts even more Pain Location: R knee Pain Descriptors / Indicators: Discomfort;Grimacing;Guarding Pain Intervention(s): Monitored during session;Limited activity within patient's tolerance    Home Living Family/patient expects to be discharged to:: Private residence                 Additional Comments: Pt reporting that she lives with a friend PTA and won't be able to return to that prior home. Pt stating she doesnt know where she will dc to at this point. Pt reports that she has no DME or AE.    Prior Function Level of Independence: Independent               Hand Dominance   Dominant Hand: Right    Extremity/Trunk Assessment   Upper Extremity Assessment Upper Extremity Assessment: Defer to OT evaluation RUE Deficits / Details: Cellulitis of R forearm. Pt with WFL of shoulder, elbow, wrist, and hand    Lower Extremity Assessment Lower Extremity Assessment: RLE deficits/detail;LLE deficits/detail RLE Deficits / Details: Cellulitis of R knee. Pt reporting significant pain, R hip and knee strength grossly 3+/5, R ankle 4/5, R knee ROM lacking approximately 10 degrees of extension and 20 degrees of flexion RLE: Unable to fully assess due to pain LLE Deficits / Details: ROM WFL, strength grossly  4/5    Cervical / Trunk Assessment Cervical / Trunk Assessment: Normal  Communication   Communication: No difficulties  Cognition Arousal/Alertness: Awake/alert Behavior During Therapy: WFL for tasks assessed/performed Overall Cognitive Status: Within Functional Limits for tasks assessed                                           Exercises General Exercises - Lower Extremity Ankle Circles/Pumps: AROM;Left;10 reps;Supine Hip ABduction/ADduction: AROM;10 reps;Right;Supine Straight Leg Raises: AROM;Right;10 reps;Supine   Assessment/Plan    PT  Assessment Patient needs continued PT services  PT Problem List Decreased strength;Decreased range of motion;Decreased activity tolerance;Decreased mobility;Decreased knowledge of use of DME;Pain       PT Treatment Interventions DME instruction;Gait training;Stair training;Functional mobility training;Therapeutic activities;Therapeutic exercise;Patient/family education    PT Goals (Current goals can be found in the Care Plan section)  Acute Rehab PT Goals Patient Stated Goal: Stop pain PT Goal Formulation: With patient Time For Goal Achievement: 09/28/16 Potential to Achieve Goals: Good    Frequency Min 3X/week    AM-PAC PT "6 Clicks" Daily Activity  Outcome Measure Difficulty turning over in bed (including adjusting bedclothes, sheets and blankets)?: A Lot Difficulty moving from lying on back to sitting on the side of the bed? : A Lot Difficulty sitting down on and standing up from a chair with arms (e.g., wheelchair, bedside commode, etc,.)?: A Little Help needed moving to and from a bed to chair (including a wheelchair)?: A Little Help needed walking in hospital room?: A Little Help needed climbing 3-5 steps with a railing? : A Lot 6 Click Score: 15    End of Session Equipment Utilized During Treatment: Gait belt Activity Tolerance: Patient limited by pain Patient left: in bed;with call bell/phone within reach Nurse Communication: Mobility status PT Visit Diagnosis: Other abnormalities of gait and mobility (R26.89);Pain;Difficulty in walking, not elsewhere classified (R26.2) Pain - Right/Left: Right Pain - part of body: Knee    Time: 1610-9604 PT Time Calculation (min) (ACUTE ONLY): 12 min   Charges:   PT Evaluation $PT Eval Low Complexity: 1 Procedure     PT G Codes:        Carrianne Hyun B. Beverely Risen PT, DPT Acute Rehabilitation  [phone] Pager [phone]    Elon Alas Fleet 09/21/2016, 5:33 PM

## 2016-09-21 NOTE — Progress Notes (Signed)
Subjective: [name] is a 41 y.o female with a PMHx significant for IV drug abuse who presented to the ED with right arm cellulitis and a left arm abscess. Blood cultures positive for Strep pyogenes and Candida krusei.   She is doing well this morning. States that she has some pain and discomfort of her right shoulder. Her right forearm became erythematous approximately 8 days prior to admission. Acknowledges subjective fever and chills at home and over night. Denies any N/V, diarrhea, chest pain, soa, cough, HA, or visual changes.   Acknowledges that she last used IV heroin 8 days ago. Has gone through withdrawals outside the hospital. Interested in quitting, has used methadone in the past.   Discussed the results of her blood cultures, including the prognosis of untreated fungemia. Informed that we have consulted ID, discussed her case with ortho, and will likely need additional imaging of her heart. Currently has no questions or concerns.   Objective:  Vital signs in last 24 hours: Vitals:   09/20/16 1900 09/20/16 2037 09/21/16 0118 09/21/16 0605  BP: 115/67 (!) 96/58 (!) 116/52 103/62  Pulse: 72 68  80  Resp: 19  18   Temp:  98.1 F (36.7 C)  98.7 F (37.1 C)  TempSrc:  Oral  Oral  SpO2: 98% 99%  98%  Weight:      Height:       Physical Exam  Constitutional: She is oriented to person, place, and time and well-developed, well-nourished, and in no distress.  HENT:  Head: Normocephalic and atraumatic.  Eyes: Conjunctivae are normal. Pupils are equal, round, and reactive to light.  Cardiovascular: Normal rate, regular rhythm and intact distal pulses.  Exam reveals no friction rub.   No murmur heard. Pulmonary/Chest: Effort normal and breath sounds normal. She has no wheezes. She has no rales.  Abdominal: Soft. Bowel sounds are normal. She exhibits no distension. There is no tenderness.  Musculoskeletal: She exhibits no edema.  No Osler nodes, splinter hemorrhages.     Neurological: She is alert and oriented to person, place, and time. GCS score is 15.  Skin:  Erythematous lesion on the lateral right forearm outlined. Erythematous lesion on the dorsal lateral surface of the left foot.   Assessment/Plan:  Principal Problem:   Fungemia Active Problems:   Cellulitis   Cellulitis of right arm   Cellulitis of right knee   Abscess of left forearm   Hypokalemia   Fall   Acute pain of right shoulder   Bacteremia  1. Strep Pyogenes and Candida krusei bacteremia.  Patient currently hemodynamically stable, no leukocytosis, splinter hemorrhages, Osler nodes, murmurs on cardiac exam. Does have right forearm cellulitis (documentented and outlined on 09/20/2016) and new erythematous region on the dorsal-lateral side of the left foot (documented and outlined on 09/21/2016).  - ID consulted, appreciate recommendations - Orthopedic surgery notified. Discussed cellulitis overlying right patella which includes hardware.   - Continue Rocephin and Eraxis for now - Daily blood cultures in order to date sterilization   - Will consult cardiology for TEE once antimicrobial therapy is determined and patient remains stable  - Opthalmology consult on 09/23/2016 to evaluate for endophthalmitis  - HIV and Hepatitis panels pending   2. IV Heroin abuse.  - Last used 8 days ago, states she went through withdrawal prior to admission  - Interested in quitting, has been on methadone in the past - Given high mortality of untreated candidal fungemia (60%), we will give her opiates for  pain control, along with Tylenol and NSAIDs. Currently on Morphine 2mg  PRN Q4Hrs.  3. Hypokalemia  - No ECG changes consistent with hypokalemia noted on 09/21/2016  - Currently at 2.8; S/p 40 mEq oral KCl x3 and IV piggyback  - BMP at 1800  Diet: Regular IVF: 100 mL/hr NS VTE ppx: Lovenox  Dispo: Unable to say how long patient may be here. Will wait until more data is collected.     Levora Dredge, MD 09/21/2016, 11:51 AM Pager: My Pager: [phone]

## 2016-09-21 NOTE — Evaluation (Signed)
Occupational Therapy Evaluation Patient Details Name: Shannon Dawson MRN: [id number] DOB: 02-23-1976 Today's Date: 09/21/2016    History of Present Illness 41 y.o female with a PMHx significant for IV drug abuse who presented to the ED with right arm cellulitis and a left arm abscess.    Clinical Impression   Upon arrival, pt up at sink performing grooming tasks. Pt currently performing ADLs and functional mobility at Mod I level with increased time due to pain in R knee. PTA, pt was independent and living with a friend; pt reports that she does not know where she will go at dc stating "I can't go back to my friends". Provided pt with education and handout on energy conservation to increase her independence with ADLs; pt verbalized understanding. Recommend dc once medically stable per physician. All acute OT need met. Will sign off.      Follow Up Recommendations  No OT follow up;Supervision - Intermittent    Equipment Recommendations  None recommended by OT    Recommendations for Other Services       Precautions / Restrictions Restrictions Weight Bearing Restrictions: No      Mobility Bed Mobility Overal bed mobility: Modified Independent             General bed mobility comments: Increased time for sit>supine  Transfers Overall transfer level: Modified independent               General transfer comment: Increased time to perform sit<>stand due to pain    Balance Overall balance assessment: No apparent balance deficits (not formally assessed)                                         ADL either performed or assessed with clinical judgement   ADL Overall ADL's : Modified independent                                       General ADL Comments: Upon arrival, pt up at sink performing grooming tasks. Pt performing ADLs and fucntional mobility at a Mod I level with increased time. Pt demonstrates good ROM to reach feet for performing  LB ADLs. Provided pt information and handout on energy conservation during ADLs to increase her independence. Pt verbalized understanding.      Vision         Perception     Praxis      Pertinent Vitals/Pain Pain Assessment: Faces Faces Pain Scale: Hurts even more Pain Location: R knee Pain Descriptors / Indicators: Discomfort;Grimacing;Guarding Pain Intervention(s): Monitored during session;Repositioned     Hand Dominance Right   Extremity/Trunk Assessment Upper Extremity Assessment Upper Extremity Assessment: RUE deficits/detail RUE Deficits / Details: Cellulitis of R forearm. Pt with WFL of shoulder, elbow, wrist, and hand   Lower Extremity Assessment Lower Extremity Assessment: RLE deficits/detail;Defer to PT evaluation RLE Deficits / Details: Cellulitis of R knee. Pt reporting significant pain   Cervical / Trunk Assessment Cervical / Trunk Assessment: Normal   Communication Communication Communication: No difficulties   Cognition Arousal/Alertness: Awake/alert Behavior During Therapy: WFL for tasks assessed/performed Overall Cognitive Status: Within Functional Limits for tasks assessed  General Comments       Exercises     Shoulder Instructions      Home Living Family/patient expects to be discharged to:: Private residence                                 Additional Comments: Pt reporting that she lives with a friend PTA and won't be able to return to that prior home. Pt stating she doesnt know where she will dc to at this point. Pt reports that she has no DME or AE.      Prior Functioning/Environment Level of Independence: Independent                 OT Problem List: Decreased activity tolerance;Decreased safety awareness;Decreased knowledge of use of DME or AE;Pain      OT Treatment/Interventions:      OT Goals(Current goals can be found in the care plan section) Acute Rehab OT  Goals Patient Stated Goal: Stop pain  OT Frequency:     Barriers to D/C:            Co-evaluation              AM-PAC PT "6 Clicks" Daily Activity     Outcome Measure Help from another person eating meals?: None Help from another person taking care of personal grooming?: None Help from another person toileting, which includes using toliet, bedpan, or urinal?: None Help from another person bathing (including washing, rinsing, drying)?: A Little Help from another person to put on and taking off regular upper body clothing?: None Help from another person to put on and taking off regular lower body clothing?: A Little 6 Click Score: 22   End of Session Nurse Communication: Mobility status  Activity Tolerance: Patient tolerated treatment well Patient left: in bed;with call bell/phone within reach  OT Visit Diagnosis: Other abnormalities of gait and mobility (R26.89);Pain Pain - Right/Left: Right Pain - part of body: Knee                Time: 6701-1003 OT Time Calculation (min): 11 min Charges:  OT General Charges $OT Visit: 1 Procedure OT Evaluation $OT Eval Low Complexity: 1 Procedure G-Codes:     Milford Cilento MSOT, OTR/L Acute Rehab Pager: [phone] Office: [place] 09/21/2016, 4:09 PM

## 2016-09-21 NOTE — Social Work (Signed)
CSW received consult that patient was homeless. Pt indicated that she will take resources hoewever her issue is transportation when she is in community. She indicated that she is on probation and has to report to probation officer next week. CSW encouraged pt to call probation officer and make aware of her physical conditions. CSW offered to call as well and patient indicated she would take care of it. In addition, patient stated she is court [facility]. CSW indicated she could f/u with program and patient indicated she would f/u on her own.  No CSW interventions needed at this time. CSW will f/u as warranted.  Keene BreathPatricia Shiva Karis, LCSW Clinical Social Worker [phone]

## 2016-09-21 NOTE — Progress Notes (Signed)
Patient arrived from ED to the 5N floor.

## 2016-09-21 NOTE — Progress Notes (Signed)
Patient transferred from ED to 5N floor. Received report from Dynegyngela RN

## 2016-09-21 NOTE — Progress Notes (Addendum)
Notified  Internal med MD of critical low value of Potasium 2.5

## 2016-09-21 NOTE — Consult Note (Addendum)
[facility] for Infectious Disease  Total days of antibiotics 2        Day 1 anidulafungin        Day 2 doxy        Day 2 cefazolin       Reason for Consult: fungemia    Referring Physician: klima  Principal Problem:   Fungemia Active Problems:   Cellulitis   Cellulitis of right arm   Cellulitis of right knee   Abscess of left forearm   Hypokalemia   Fall   Acute pain of right shoulder   Bacteremia    HPI: Shannon Dawson is a 41 y.o. female with past med hx of anxiety/depression, recent heroin use disorder, admitted on 7/6 for cellulitis and left forearm abscess. However, she presented to the ED after sustaining MSK injury to right knee and right shoulder after ground level fall. In the ED, her physical exam was remarkable for left arm abscess with surrounding cellulitis as well as cellulitis to right arm. She Had I x D of left forearm cutaneous abscess, infectious work up included blood cx plus abscess fluid sent for culture. BCID + for strep pyogenes and candida krusei on blood cx. She was initially started on cefazolin plus doxy, then changed to ceftriaxone plus anidulafungin. She states that the swelling in her right knee and right forearm is improved as well as left forearm, however still has pain with right shoulder.  Past hx of drug use includes that she previously was on oral opiates from chronic low back pain but started iv heroin use roughly 5-6 years ago. She has tried intermittently attempted to quit. She has more success with methadone rather than suboxone but unable to afford either. She used to use heroin every day up until 1 wk ago. Often re-uses syringes, though denies sharing. Does not prep skin. Injects to hands mainly. She does not recall injecting into forearms. No prior hx of having endocarditis  She is currently unemployed, lives with a friend, the home occ does not have electricity  She was dx with hep C roughly 5 years ago  Past Medical History:    Diagnosis Date  . Anemia    hx  . Anxiety   . Chronic lower back pain   . Chronic pain of right knee    post knee injury 2011, pins and wires, rod  . Depression    hx  . GAD (generalized anxiety disorder)   . Headache(784.0)    otc med prn  . History of alcohol abuse    pt verbalized by pt no alcohol since 2011  . History of heroin abuse    per epic documentation and pt detoxed 2011--  verbalized by pt clean since 2011  . History of kidney stones   . Irregular menstrual cycle   . Nephrolithiasis    bilateral non-obstructive per renal u/s 05-27-2015  . OCD (obsessive compulsive disorder)   . Ovarian cyst    BILATERAL  . Renal cyst, right   . Right ureteral stone   . SVD (spontaneous vaginal delivery)    x 2    Allergies:  Allergies  Allergen Reactions  . Tramadol Hives and Itching    Severe  . Vicodin [Hydrocodone-Acetaminophen] Itching    Itching is severe.     MEDICATIONS: . docusate sodium  100 mg Oral BID  . enoxaparin (LOVENOX) injection  40 mg Subcutaneous Q24H  . nicotine  14 mg Transdermal Daily  . senna  1 tablet Oral BID  . sodium chloride flush  3 mL Intravenous Q12H    Social History  Substance Use Topics  . Smoking status: Current Every Day Smoker    Packs/day: 1.00    Years: 25.00    Types: Cigarettes  . Smokeless tobacco: Never Used  . Alcohol use No     Comment: hx alcohol abuse (per pt no alcohol since 2011)    History reviewed. No pertinent family history. she is adopted. Does not know her family hx   Review of Systems  Constitutional: positive  for fever, chills, diaphoresis, activity change, appetite change, fatigue and unexpected weight change.  HENT: Negative for congestion, sore throat, rhinorrhea, sneezing, trouble swallowing and sinus pressure.  Eyes: Negative for photophobia and visual disturbance.  Respiratory: Negative for cough, chest tightness, shortness of breath, wheezing and stridor.  Cardiovascular: Negative for  chest pain, palpitations and leg swelling.  Gastrointestinal: Negative for nausea, vomiting, abdominal pain, diarrhea, constipation, blood in stool, abdominal distention and anal bleeding.  Genitourinary: Negative for dysuria, hematuria, flank pain and difficulty urinating.  Musculoskeletal: positive for right knee and shoulder pain Skin: positive for redness to arms and right knee Neurological: Negative for dizziness, tremors, weakness and light-headedness.  Hematological: Negative for adenopathy. Does not bruise/bleed easily.  Psychiatric/Behavioral: Negative for behavioral problems, confusion, sleep disturbance, dysphoric mood, decreased concentration and agitation.     OBJECTIVE: Temp:  [98.1 F (36.7 C)-99.6 F (37.6 C)] 98.7 F (37.1 C) (07/07 0605) Pulse Rate:  [68-101] 80 (07/07 0605) Resp:  [17-19] 18 (07/07 0118) BP: (96-143)/(52-93) 103/62 (07/07 0605) SpO2:  [96 %-100 %] 98 % (07/07 0605) Weight:  [145 lb (65.8 kg)] 145 lb (65.8 kg) (07/06 1406) Physical Exam  Constitutional:  oriented to person, place, and time. appears unkept, older than stated age. No distress.  HENT: [place]/AT, PERRLA, no scleral icterus. Mouth/Throat: Oropharynx is clear and moist. No oropharyngeal exudate. poor dentition Cardiovascular: Normal rate, regular rhythm and normal heart sounds. Exam reveals no gallop and no friction rub.  No murmur heard.  Pulmonary/Chest: Effort normal and breath sounds normal. No respiratory distress.  has no wheezes.  Neck = supple, no nuchal rigidity Abdominal: Soft. Bowel sounds are normal.  exhibits no distension. There is no tenderness.  Ext: right forearm has large erythema slightly indurated area that is outline, has erythematous patch, area coming to a head on lateral bony prominence of rigth wrist. Left forearm = exudate on dressing from I x d, still has some indurated. Dorsum of hands have scars from recent puncture marks. Right knee = still warm to touch,  fluctuance? Bursa to top of patella. Scarring from prior sugery is noted. Right shoulder has limited ROM due to pain. Lymphadenopathy: no cervical adenopathy. No axillary adenopathy Neurological: alert and oriented to person, place, and time.  Skin: as noted above. No noteable stigmata of endocarditis other than puncture marks to dorsum of hands Psychiatric: a normal mood and affect. Slightly guarded during discussion   LABS: Results for orders placed or performed during the hospital encounter of 09/20/16 (from the past 48 hour(s))  Rapid urine drug screen (hospital performed)     Status: Abnormal   Collection Time: 09/20/16  3:47 PM  Result Value Ref Range   Opiates NONE DETECTED NONE DETECTED   Cocaine NONE DETECTED NONE DETECTED   Benzodiazepines NONE DETECTED NONE DETECTED   Amphetamines POSITIVE (A) NONE DETECTED   Tetrahydrocannabinol NONE DETECTED NONE DETECTED   Barbiturates NONE DETECTED NONE DETECTED  Comment:        DRUG SCREEN FOR MEDICAL PURPOSES ONLY.  IF CONFIRMATION IS NEEDED FOR ANY PURPOSE, NOTIFY LAB WITHIN 5 DAYS.        LOWEST DETECTABLE LIMITS FOR URINE DRUG SCREEN Drug Class       Cutoff (ng/mL) Amphetamine      1000 Barbiturate      200 Benzodiazepine   716 Tricyclics       967 Opiates          300 Cocaine          300 THC              50   Urinalysis, Routine w reflex microscopic     Status: Abnormal   Collection Time: 09/20/16  3:47 PM  Result Value Ref Range   Color, Urine AMBER (A) YELLOW    Comment: BIOCHEMICALS MAY BE AFFECTED BY COLOR   APPearance CLOUDY (A) CLEAR   Specific Gravity, Urine 1.011 1.005 - 1.030   pH 6.0 5.0 - 8.0   Glucose, UA NEGATIVE NEGATIVE mg/dL   Hgb urine dipstick MODERATE (A) NEGATIVE   Bilirubin Urine NEGATIVE NEGATIVE   Ketones, ur NEGATIVE NEGATIVE mg/dL   Protein, ur 100 (A) NEGATIVE mg/dL   Nitrite NEGATIVE NEGATIVE   Leukocytes, UA LARGE (A) NEGATIVE   RBC / HPF 0-5 0 - 5 RBC/hpf   WBC, UA TOO NUMEROUS TO  COUNT 0 - 5 WBC/hpf   Bacteria, UA MANY (A) NONE SEEN   Squamous Epithelial / LPF 0-5 (A) NONE SEEN   Mucous PRESENT   Blood culture (routine x 2)     Status: None (Preliminary result)   Collection Time: 09/20/16  3:51 PM  Result Value Ref Range   Specimen Description BLOOD LEFT ANTECUBITAL    Special Requests      BOTTLES DRAWN AEROBIC AND ANAEROBIC Blood Culture adequate volume   Culture  Setup Time      GRAM POSITIVE COCCI IN CHAINS IN BOTH AEROBIC AND ANAEROBIC BOTTLES CRITICAL RESULT CALLED TO, READ BACK BY AND VERIFIED WITH: J Hedley,PHARMD AT 8938 09/21/16 BY L BENFIELD    Culture GRAM POSITIVE COCCI    Report Status PENDING   Blood culture (routine x 2)     Status: None (Preliminary result)   Collection Time: 09/20/16  3:59 PM  Result Value Ref Range   Specimen Description BLOOD LEFT ANTECUBITAL    Special Requests      BOTTLES DRAWN AEROBIC AND ANAEROBIC Blood Culture adequate volume   Culture  Setup Time      GRAM POSITIVE COCCI IN CHAINS ANAEROBIC BOTTLE ONLY CRITICAL RESULT CALLED TO, READ BACK BY AND VERIFIED WITH: J [place],PHARMD AT 1017 09/21/16 BY L BENFIELD    Culture GRAM POSITIVE COCCI    Report Status PENDING   CBC with Differential/Platelet     Status: Abnormal   Collection Time: 09/20/16  3:59 PM  Result Value Ref Range   WBC 10.5 4.0 - 10.5 K/uL   RBC 3.95 3.87 - 5.11 MIL/uL   Hemoglobin 11.4 (L) 12.0 - 15.0 g/dL   HCT 33.7 (L) 36.0 - 46.0 %   MCV 85.3 78.0 - 100.0 fL   MCH 28.9 26.0 - 34.0 pg   MCHC 33.8 30.0 - 36.0 g/dL   RDW 16.3 (H) 11.5 - 15.5 %   Platelets 237 150 - 400 K/uL   Neutrophils Relative % 84 %   Neutro Abs 8.7 (H) 1.7 - 7.7 K/uL  Lymphocytes Relative 9 %   Lymphs Abs 1.0 0.7 - 4.0 K/uL   Monocytes Relative 7 %   Monocytes Absolute 0.8 0.1 - 1.0 K/uL   Eosinophils Relative 0 %   Eosinophils Absolute 0.0 0.0 - 0.7 K/uL   Basophils Relative 0 %   Basophils Absolute 0.0 0.0 - 0.1 K/uL  Comprehensive metabolic panel     Status:  Abnormal   Collection Time: 09/20/16  3:59 PM  Result Value Ref Range   Sodium 128 (L) 135 - 145 mmol/L   Potassium 2.3 (LL) 3.5 - 5.1 mmol/L    Comment: CRITICAL RESULT CALLED TO, READ BACK BY AND VERIFIED WITH: A SANDERS,RN 1732 09/20/16 D BRADLEY    Chloride 90 (L) 101 - 111 mmol/L   CO2 28 22 - 32 mmol/L   Glucose, Bld 122 (H) 65 - 99 mg/dL   BUN 7 6 - 20 mg/dL   Creatinine, Ser 0.81 0.44 - 1.00 mg/dL   Calcium 8.4 (L) 8.9 - 10.3 mg/dL   Total Protein 6.9 6.5 - 8.1 g/dL   Albumin 2.5 (L) 3.5 - 5.0 g/dL   AST 29 15 - 41 U/L   ALT 17 14 - 54 U/L   Alkaline Phosphatase 87 38 - 126 U/L   Total Bilirubin 0.9 0.3 - 1.2 mg/dL   GFR calc non Af Amer >60 >60 mL/min   GFR calc Af Amer >60 >60 mL/min    Comment: (NOTE) The eGFR has been calculated using the CKD EPI equation. This calculation has not been validated in all clinical situations. eGFR's persistently <60 mL/min signify possible Chronic Kidney Disease.    Anion gap 10 5 - 15  Blood Culture ID Panel (Reflexed)     Status: Abnormal   Collection Time: 09/20/16  3:59 PM  Result Value Ref Range   Enterococcus species NOT DETECTED NOT DETECTED   Listeria monocytogenes NOT DETECTED NOT DETECTED   Staphylococcus species NOT DETECTED NOT DETECTED   Staphylococcus aureus NOT DETECTED NOT DETECTED   Streptococcus species DETECTED (A) NOT DETECTED    Comment: CRITICAL RESULT CALLED TO, READ BACK BY AND VERIFIED WITH: J [place],PHARMD AT 3474 09/21/16 BY L BENFIELD    Streptococcus agalactiae NOT DETECTED NOT DETECTED   Streptococcus pneumoniae NOT DETECTED NOT DETECTED   Streptococcus pyogenes DETECTED (A) NOT DETECTED    Comment: CRITICAL RESULT CALLED TO, READ BACK BY AND VERIFIED WITH: J [place],PHARMD AT 2595 09/21/16 BY L BENFIELD    Acinetobacter baumannii NOT DETECTED NOT DETECTED   Enterobacteriaceae species NOT DETECTED NOT DETECTED   Enterobacter cloacae complex NOT DETECTED NOT DETECTED   Escherichia coli NOT DETECTED NOT  DETECTED   Klebsiella oxytoca NOT DETECTED NOT DETECTED   Klebsiella pneumoniae NOT DETECTED NOT DETECTED   Proteus species NOT DETECTED NOT DETECTED   Serratia marcescens NOT DETECTED NOT DETECTED   Haemophilus influenzae NOT DETECTED NOT DETECTED   Neisseria meningitidis NOT DETECTED NOT DETECTED   Pseudomonas aeruginosa NOT DETECTED NOT DETECTED   Candida albicans NOT DETECTED NOT DETECTED   Candida glabrata NOT DETECTED NOT DETECTED   Candida krusei DETECTED (A) NOT DETECTED    Comment: CRITICAL RESULT CALLED TO, READ BACK BY AND VERIFIED WITH: J [place],PHARMD AT 6387 09/21/16 BY L BENFIELD    Candida parapsilosis NOT DETECTED NOT DETECTED   Candida tropicalis NOT DETECTED NOT DETECTED  Wound or Superficial Culture     Status: None (Preliminary result)   Collection Time: 09/20/16  6:26 PM  Result Value  Ref Range   Specimen Description ABSCESS LEFT FOREARM    Special Requests NONE    Gram Stain      ABUNDANT WBC PRESENT,BOTH PMN AND MONONUCLEAR RARE GRAM POSITIVE COCCI IN PAIRS    Culture CULTURE REINCUBATED FOR BETTER GROWTH    Report Status PENDING   Basic metabolic panel     Status: Abnormal   Collection Time: 09/20/16  8:55 PM  Result Value Ref Range   Sodium 131 (L) 135 - 145 mmol/L   Potassium 2.5 (LL) 3.5 - 5.1 mmol/L    Comment: CRITICAL RESULT CALLED TO, READ BACK BY AND VERIFIED WITH: VANDYKE S,RN 09/20/16 2139 WAYK    Chloride 93 (L) 101 - 111 mmol/L   CO2 30 22 - 32 mmol/L   Glucose, Bld 142 (H) 65 - 99 mg/dL   BUN 7 6 - 20 mg/dL   Creatinine, Ser 0.84 0.44 - 1.00 mg/dL   Calcium 8.3 (L) 8.9 - 10.3 mg/dL   GFR calc non Af Amer >60 >60 mL/min   GFR calc Af Amer >60 >60 mL/min    Comment: (NOTE) The eGFR has been calculated using the CKD EPI equation. This calculation has not been validated in all clinical situations. eGFR's persistently <60 mL/min signify possible Chronic Kidney Disease.    Anion gap 8 5 - 15  Magnesium     Status: None   Collection  Time: 09/20/16  8:55 PM  Result Value Ref Range   Magnesium 2.0 1.7 - 2.4 mg/dL  Basic metabolic panel     Status: Abnormal   Collection Time: 09/21/16  8:42 AM  Result Value Ref Range   Sodium 133 (L) 135 - 145 mmol/L   Potassium 2.8 (L) 3.5 - 5.1 mmol/L   Chloride 100 (L) 101 - 111 mmol/L   CO2 22 22 - 32 mmol/L   Glucose, Bld 173 (H) 65 - 99 mg/dL   BUN 10 6 - 20 mg/dL   Creatinine, Ser 0.97 0.44 - 1.00 mg/dL   Calcium 8.2 (L) 8.9 - 10.3 mg/dL   GFR calc non Af Amer >60 >60 mL/min   GFR calc Af Amer >60 >60 mL/min    Comment: (NOTE) The eGFR has been calculated using the CKD EPI equation. This calculation has not been validated in all clinical situations. eGFR's persistently <60 mL/min signify possible Chronic Kidney Disease.    Anion gap 11 5 - 15  CBC     Status: Abnormal   Collection Time: 09/21/16  8:42 AM  Result Value Ref Range   WBC 9.4 4.0 - 10.5 K/uL   RBC 2.98 (L) 3.87 - 5.11 MIL/uL   Hemoglobin 8.6 (L) 12.0 - 15.0 g/dL    Comment: DELTA CHECK NOTED RESULT REPEATED AND VERIFIED    HCT 25.8 (L) 36.0 - 46.0 %   MCV 86.6 78.0 - 100.0 fL   MCH 28.9 26.0 - 34.0 pg   MCHC 33.3 30.0 - 36.0 g/dL   RDW 16.9 (H) 11.5 - 15.5 %   Platelets 253 150 - 400 K/uL    MICRO:  IMAGING: Dg Chest 2 View  Result Date: 09/20/2016 CLINICAL DATA:  Right upper chest pain after fall last night. EXAM: CHEST  2 VIEW COMPARISON:  07/19/2009 FINDINGS: AP and lateral views of the chest show no focal airspace consolidation. No pulmonary edema or pleural effusion. No pneumothorax. The cardiopericardial silhouette is within normal limits for size. The visualized bony structures of the thorax are intact. IMPRESSION: No active cardiopulmonary  disease. Electronically Signed   By: Misty Stanley M.D.   On: 09/20/2016 15:21   Dg Shoulder Right  Result Date: 09/20/2016 CLINICAL DATA:  Acute right shoulder pain following fall. Initial encounter. EXAM: RIGHT SHOULDER - 2+ VIEW COMPARISON:  None.  FINDINGS: There is no evidence of fracture or dislocation. There is no evidence of arthropathy or other focal bone abnormality. Soft tissues are unremarkable. IMPRESSION: Negative. Electronically Signed   By: Margarette Canada M.D.   On: 09/20/2016 22:11   Dg Knee Complete 4 Views Right  Result Date: 09/20/2016 CLINICAL DATA:  Right knee inflammation after fall onto anterior patella. Initial encounter. EXAM: RIGHT KNEE - COMPLETE 4+ VIEW COMPARISON:  Tibia radiography 07/18/2009 FINDINGS: Prepatellar soft tissue swelling without opaque foreign body or acute fracture. Status post screw and cerclage wire fixation of the patella. Previous tibial nail fixation of a healed fracture. Healed displaced fibular shaft fracture. Negative for joint effusion or acute fracture. IMPRESSION: 1. Prepatellar swelling without acute osseous finding or opaque foreign body. 2. Remote patella and tibial ORIF. Electronically Signed   By: Monte Fantasia M.D.   On: 09/20/2016 15:16    Assessment/Plan:  41yo F hx of chronic hepatitis c and heroin misuse disorder presents with right knee and shoulder pain from fall found to have skin soft tissue infection and bacteremia/fungemia concerning for disseminated infection such as subacute endocarditis, possibly septic bursitis of knee and shoulder   Polymicrobial (strep pyogenes bacteremia + c.krusei fungemia) = likely from injection drug use and improper disinfection of skin or syringe. -Recommend to get repeat blood cx tomorrow -Continue on cefazolin 2gm IV Q 8hr plus anidulafungin -Recommend to get TEE -Length of treatment will depend on TEE.  Unfortunate that she has c.krusei - since this species is azole resistant and will need echinocandin for minimum of 2 wk  Agree with primary team to get ophtho evaluation on Monday to rule out endophtholmitis. Presently asymptomatic  SSTI = would be treated with cefazolin, thought to be all due to strep pyogenes  heroin misuse disorder =  further along her hospitalization when she is out of acute pain from recent I x D and knee injury, consider starting methadone during her hospitalization to treat opiate dependence while management of her underlying infection.  Right knee swelling = likely due to mechanical injury but would still monitor closely to see if anything needs to be aspirated  Chronic hep c without hepatic coma = recommend to check hep C VL

## 2016-09-21 NOTE — Progress Notes (Signed)
PHARMACY - PHYSICIAN COMMUNICATION CRITICAL VALUE ALERT - BLOOD CULTURE IDENTIFICATION (BCID)  Results for orders placed or performed during the hospital encounter of 09/20/16  Blood Culture ID Panel (Reflexed) (Collected: 09/20/2016  3:59 PM)  Result Value Ref Range   Enterococcus species NOT DETECTED NOT DETECTED   Listeria monocytogenes NOT DETECTED NOT DETECTED   Staphylococcus species NOT DETECTED NOT DETECTED   Staphylococcus aureus NOT DETECTED NOT DETECTED   Streptococcus species DETECTED (A) NOT DETECTED   Streptococcus agalactiae NOT DETECTED NOT DETECTED   Streptococcus pneumoniae NOT DETECTED NOT DETECTED   Streptococcus pyogenes DETECTED (A) NOT DETECTED   Acinetobacter baumannii NOT DETECTED NOT DETECTED   Enterobacteriaceae species NOT DETECTED NOT DETECTED   Enterobacter cloacae complex NOT DETECTED NOT DETECTED   Escherichia coli NOT DETECTED NOT DETECTED   Klebsiella oxytoca NOT DETECTED NOT DETECTED   Klebsiella pneumoniae NOT DETECTED NOT DETECTED   Proteus species NOT DETECTED NOT DETECTED   Serratia marcescens NOT DETECTED NOT DETECTED   Haemophilus influenzae NOT DETECTED NOT DETECTED   Neisseria meningitidis NOT DETECTED NOT DETECTED   Pseudomonas aeruginosa NOT DETECTED NOT DETECTED   Candida albicans NOT DETECTED NOT DETECTED   Candida glabrata NOT DETECTED NOT DETECTED   Candida krusei DETECTED (A) NOT DETECTED   Candida parapsilosis NOT DETECTED NOT DETECTED   Candida tropicalis NOT DETECTED NOT DETECTED    Name of physician (or Provider) Contacted: Spoke with Dr. Delma Officerhundi  Changes to prescribed antibiotics required: We discussed adding anidulafungin 200 mg IV x1 then 100 mg IV q24h. Cefazolin will cover group A Strep. Dr. Delma Officerhundi to discuss with her team.  Loura BackJennifer Talala, PharmD, BCPS Clinical Pharmacist Phone for today [phone] Main pharmacy - [phone] 09/21/2016 9:23 AM

## 2016-09-22 LAB — COMPREHENSIVE METABOLIC PANEL
ALT: UNDETERMINED U/L (ref 14–54)
AST: UNDETERMINED U/L (ref 15–41)
Albumin: 1.9 g/dL — ABNORMAL LOW (ref 3.5–5.0)
Alkaline Phosphatase: UNDETERMINED U/L (ref 38–126)
Anion gap: 10 (ref 5–15)
BILIRUBIN TOTAL: UNDETERMINED mg/dL (ref 0.3–1.2)
BUN: 11 mg/dL (ref 6–20)
CHLORIDE: 107 mmol/L (ref 101–111)
CO2: 19 mmol/L — ABNORMAL LOW (ref 22–32)
CREATININE: 0.77 mg/dL (ref 0.44–1.00)
Calcium: 8.8 mg/dL — ABNORMAL LOW (ref 8.9–10.3)
GFR calc Af Amer: >60 mL/min (ref >60)
Glucose, Bld: 91 mg/dL (ref 65–99)
Potassium: 5.2 mmol/L — ABNORMAL HIGH (ref 3.5–5.1)
Sodium: 136 mmol/L (ref 135–145)
Total Protein: 6.2 g/dL — ABNORMAL LOW (ref 6.5–8.1)

## 2016-09-22 LAB — CBC
HCT: 31 % — ABNORMAL LOW (ref 36.0–46.0)
Hemoglobin: 10.4 g/dL — ABNORMAL LOW (ref 12.0–15.0)
MCH: 29.4 pg (ref 26.0–34.0)
MCHC: 33.5 g/dL (ref 30.0–36.0)
MCV: 87.6 fL (ref 78.0–100.0)
PLATELETS: 317 10*3/uL (ref 150–400)
RBC: 3.54 MIL/uL — ABNORMAL LOW (ref 3.87–5.11)
RDW: 17.5 % — AB (ref 11.5–15.5)
WBC: 8.3 10*3/uL (ref 4.0–10.5)

## 2016-09-22 LAB — HIV ANTIBODY (ROUTINE TESTING W REFLEX): HIV SCREEN 4TH GENERATION: NONREACTIVE

## 2016-09-22 LAB — URINE CULTURE: Culture: >=100000 — AB

## 2016-09-22 LAB — HCV RNA QUANT: HCV QUANT: NOT DETECTED [IU]/mL (ref >50)

## 2016-09-22 MED ORDER — DIPHENHYDRAMINE HCL 25 MG PO CAPS
25.0000 mg | ORAL_CAPSULE | Freq: Four times a day (QID) | ORAL | Status: DC | PRN
  Administered 2016-09-22 – 2016-09-28 (×6): 25 mg via ORAL
  Filled 2016-09-22 (×7): qty 1

## 2016-09-22 MED ORDER — SODIUM POLYSTYRENE SULFONATE 15 GM/60ML PO SUSP
15.0000 g | Freq: Once | ORAL | Status: AC
  Administered 2016-09-22: 15 g via ORAL
  Filled 2016-09-22: qty 60

## 2016-09-22 MED ORDER — VANCOMYCIN HCL 10 G IV SOLR
1250.0000 mg | Freq: Two times a day (BID) | INTRAVENOUS | Status: DC
  Administered 2016-09-22 – 2016-09-25 (×6): 1250 mg via INTRAVENOUS
  Filled 2016-09-22 (×7): qty 1250

## 2016-09-22 NOTE — Progress Notes (Signed)
Pharmacy Antibiotic Note  Shannon SnareMary Dawson is a 41 y.o. female admitted on 09/20/2016 with bacteremia.  Pharmacy has been consulted for vanc dosing.  Shannon Dawson has multitude of infectious issue with GAS bacteremia and candidemia. She is currently on ancef and anidulafungin. Dr Drue SecondSnider saw her yesterday and will be seeing today also. She asked to place pt on IV vanc for now due to the MRSA in wound until she sees.   Plan:  Vanc 1.25g IV q12 until Dr. Drue SecondSnider sees Cont Eraxis and Ancef for now  Height: 5\' 3"  (160 cm) Weight: 145 lb (65.8 kg) IBW/kg (Calculated) : 52.4  Temp (24hrs), Avg:98.5 F (36.9 C), Min:97.7 F (36.5 C), Max:99.9 F (37.7 C)   Recent Labs Lab 09/20/16 1559 09/20/16 2055 09/21/16 0842 09/21/16 1814 09/22/16 0544 09/22/16 0734  WBC 10.5  --  9.4  --   --  8.3  CREATININE 0.81 0.84 0.97 0.75 0.77  --     Estimated Creatinine Clearance: 84.4 mL/min (by C-G formula based on SCr of 0.77 mg/dL).    Allergies  Allergen Reactions  . Tramadol Hives and Itching    Severe  . Vicodin [Hydrocodone-Acetaminophen] Itching    Itching is severe.     Antimicrobials this admission: 7/7 Ancef >>  7/7 Anidulafungin >>  7/8 Vanc>> 7/6 Rocephin x1 7/7 Doxycycline>>7/7  Dose adjustments this admission:   Microbiology results: 7/6 BCx: GAS 7/6 UCx: enterobacter pan sens except ancef Sputum:  7/6 wound>>MRSA  Shannon Dawson, PharmD, BCPS, AAHIVP, CPP Infectious Disease Pharmacist Pager: [phone] 09/22/2016 3:34 PM

## 2016-09-22 NOTE — Progress Notes (Signed)
Subjective: She continues to complain of right shoulder/collarbone pain this morning. States she has been eating well and having regular bowel movements. Reports feeling well otherwise.  Objective:  Vital signs in last 24 hours: Vitals:   09/21/16 1900 09/22/16 0300 09/22/16 0738 09/22/16 0749  BP: 94/62 113/76 107/63   Pulse: 64 81 76   Resp: 18 18    Temp: 98.6 F (37 C) 99.9 F (37.7 C)  98.3 F (36.8 C)  TempSrc: Tympanic Oral  Oral  SpO2: 100% 100% 99%   Weight:      Height:       Physical Exam  Constitutional: She is oriented to person, place, and time and well-developed, well-nourished, and in no distress.  HENT:  Head: Normocephalic and atraumatic.  Eyes: Right eye exhibits no discharge. Left eye exhibits no discharge.  Cardiovascular: Normal rate, regular rhythm and intact distal pulses.  Exam reveals no gallop and no friction rub.   No murmur heard. Pulmonary/Chest: Effort normal and breath sounds normal. No respiratory distress. She has no wheezes. She has no rales.  Abdominal: Soft. Bowel sounds are normal. She exhibits no distension. There is no tenderness.  Musculoskeletal: She exhibits no edema.  Right shoulder: No erythema, increased warmth, or swelling noted. Slightly decreased active range of motion.  Neurological: She is alert and oriented to person, place, and time.  Skin: Skin is warm and dry.  No Osler nodes, splinter hemorrhages.  Area of erythema and increased warmth noted on the right forearm and dorsal lateral surface of the left foot. Erythema has not extended beyond the areas of demarcation. The area of drained abscess on her left forearm appears to be healing well - mild serous drainage, no pus or blood noted. Area of erythema and increased warmth over the right patella.   Assessment/Plan:  Principal Problem:   Fungemia Active Problems:   Cellulitis   Cellulitis of right arm   Cellulitis of right knee   Abscess of left forearm    Hypokalemia   Fall   Acute pain of right shoulder   Bacteremia  Strep Pyogenes and Candida krusei bacteremia.  Likely from injection drug use. Patient currently hemodynamically stable, no leukocytosis, splinter hemorrhages, Osler nodes, murmurs on cardiac exam. The areas of cellulitis on her right forearm and left foot appear to be improving. The area of drained abscess on her left forearm appears to be healing well.  - ID consulted, appreciate recommendations - Orthopedic surgery notified. Discussed cellulitis overlying right patella which includes hardware.    -Continue Cefazolin 2 gm IV q8 hrs  - Continue Anidulafungin for a minimum of 2 weeks - Daily blood cultures in order to date sterilization   - Will consult cardiology for TEE on 7/9 Monday - Opthalmology consult on 07/09 to evaluate for endophthalmitis  - HIV and Hepatitis panels pending   Asymptomatic Bacteriuria: Urine cx gorwing >100,000 CFUs of enterobacter aerogenes.  - Continue cefazolin as above   Right shoulder pain: X-ray of right shoulder was negative on admission. Chest x-ray did not reveal any rib fractures. 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 Interested in quitting, has been on methadone in the past.  - Consider starting methadone after patient's pain resolves.  Hyperkalemia K mikdly elevated at 5.2 this morning.  -kayexalate 15  gm once   Diet: Regular IVF: 125 mL/hr NS VTE ppx: Lovenox  Dispo: Unable to say how long patient may be here. Will wait until more data is collected.    John Giovanniathore, Anavi Branscum, MD 09/22/2016, 7:51 AM Pager: My Pager: [phone]

## 2016-09-23 ENCOUNTER — Inpatient Hospital Stay (HOSPITAL_COMMUNITY): Payer: Self-pay

## 2016-09-23 ENCOUNTER — Other Ambulatory Visit (HOSPITAL_COMMUNITY): Payer: Self-pay

## 2016-09-23 DIAGNOSIS — M7041 Prepatellar bursitis, right knee: Secondary | ICD-10-CM

## 2016-09-23 DIAGNOSIS — L02413 Cutaneous abscess of right upper limb: Secondary | ICD-10-CM

## 2016-09-23 LAB — CULTURE, BLOOD (ROUTINE X 2)
SPECIAL REQUESTS: ADEQUATE
Special Requests: ADEQUATE

## 2016-09-23 LAB — BASIC METABOLIC PANEL
ANION GAP: 8 (ref 5–15)
BUN: 7 mg/dL (ref 6–20)
CALCIUM: 8.2 mg/dL — AB (ref 8.9–10.3)
CO2: 24 mmol/L (ref 22–32)
CREATININE: 0.69 mg/dL (ref 0.44–1.00)
Chloride: 109 mmol/L (ref 101–111)
GFR calc Af Amer: >60 mL/min (ref >60)
GLUCOSE: 95 mg/dL (ref 65–99)
Potassium: 3.2 mmol/L — ABNORMAL LOW (ref 3.5–5.1)
Sodium: 141 mmol/L (ref 135–145)

## 2016-09-23 LAB — HEPATITIS B SURFACE ANTIBODY,QUALITATIVE: Hep B S Ab: NONREACTIVE

## 2016-09-23 LAB — HEPATITIS B CORE ANTIBODY, TOTAL: Hep B Core Total Ab: NEGATIVE

## 2016-09-23 LAB — AEROBIC CULTURE W GRAM STAIN (SUPERFICIAL SPECIMEN)

## 2016-09-23 LAB — AEROBIC CULTURE  (SUPERFICIAL SPECIMEN)

## 2016-09-23 LAB — HEPATITIS B SURFACE ANTIGEN: Hepatitis B Surface Ag: NEGATIVE

## 2016-09-23 MED ORDER — HEPARIN SODIUM (PORCINE) 5000 UNIT/ML IJ SOLN
5000.0000 [IU] | Freq: Three times a day (TID) | INTRAMUSCULAR | Status: DC
  Filled 2016-09-23 (×7): qty 1

## 2016-09-23 MED ORDER — SODIUM CHLORIDE 0.9 % IV SOLN: INTRAVENOUS | Status: DC

## 2016-09-23 NOTE — Progress Notes (Signed)
[facility]  [facility] following pt with ID team to support pharmacy for home IV Antifungal upon DC if needed/ordered.  If patient discharges after hours, please call [phone].   Shannon Dawson 09/23/2016, 9:43 AM

## 2016-09-23 NOTE — Progress Notes (Signed)
Physical Therapy Discharge Patient Details Name: Shannon Dawson MRN: [id number] DOB: [birth year] Today's Date: 09/23/2016 Time: 8022-1798 PT Time Calculation (min) (ACUTE ONLY): 19 min  Patient discharged from PT services secondary to goals met and no further PT needs identified.  Please see latest therapy progress note for current level of functioning and progress toward goals.    Progress and discharge plan discussed with patient and/or caregiver: Patient agrees with plan  GP     Shannon Dawson [name] 09/23/2016, 12:06 PM

## 2016-09-23 NOTE — Progress Notes (Signed)
    [facility] has been requested to perform a transesophageal echocardiogram on 07/10 for fungemia.  After careful review of history and examination, the risks and benefits of transesophageal echocardiogram have been explained including risks of esophageal damage, perforation (1:10,000 risk), bleeding, pharyngeal hematoma as well as other potential complications associated with conscious sedation including aspiration, arrhythmia, respiratory failure and death. Alternatives to treatment were discussed, questions were answered. Patient is willing to proceed.   Leanna BattlesBarrett, Rhonda, PA-C 09/23/2016 5:39 PM

## 2016-09-23 NOTE — Progress Notes (Signed)
Patient ID: Shannon Dawson, female   DOB: Mar 10, 1976, 41 y.o.   MRN: [id number]          [facility]  Date of Admission:  09/20/2016           Day 4 cephalexin        Day 3 anidulafungin        Day 2 vancomycin ASSESSMENT: She has disseminated infection with cellulitis, soft tissue abscesses probable right prepatellar bursitis bacteremia and fungemia. We can stop cefazolin and continue anidulafungin and vancomycin for now. I would recommend aspiration of the right prepatellar bursa. Although antibiotics alone can treat septic bursitis it usually requires drainage for optimal control. I would consider aspiration/drainage especially since she has previous hardware fixation of a patellar fracture.  PLAN: 1. Continue anidulafungin and vancomycin 2. Discontinue cefazolin 3. Recommend aspiration of right prepatellar bursa 4. Agree with ultrasound to look for evidence of drainable right forearm abscesses 5. Await results of repeat blood culture and echocardiogram.  Principal Problem:   Fungemia Active Problems:   Cellulitis   Cellulitis of right arm   Cellulitis of right knee   Abscess of left forearm   Hypokalemia   Fall   Acute pain of right shoulder   Bacteremia   . docusate sodium  100 mg Oral BID  . heparin subcutaneous  5,000 Units Subcutaneous Q8H  . nicotine  14 mg Transdermal Daily  . senna  1 tablet Oral BID  . sodium chloride flush  3 mL Intravenous Q12H    SUBJECTIVE: She is still having right shoulder and right knee pain. She has a history of open reduction and internal fixation of right patellar fracture. She says her left forearm abscess is improving after incision and drainage. She feels like her right forearm cellulitis and abscesses are getting worse.  Review of Systems: Review of Systems  Constitutional: Negative for chills, diaphoresis and fever.  Respiratory: Negative for cough, sputum production and shortness of breath.     Cardiovascular: Negative for chest pain.  Gastrointestinal: Negative for abdominal pain, diarrhea, nausea and vomiting.  Genitourinary: Negative for dysuria.  Musculoskeletal: Positive for back pain and joint pain.  Psychiatric/Behavioral: Positive for substance abuse.    Allergies  Allergen Reactions  . Tramadol Hives and Itching    Severe  . Vicodin [Hydrocodone-Acetaminophen] Itching    Itching is severe.     OBJECTIVE: Vitals:   09/22/16 1158 09/22/16 1826 09/22/16 2122 09/23/16 0646  BP: 106/66 112/77 125/73 108/66  Pulse: 62 62 63 (!) 59  Resp: 18     Temp: 98.1 F (36.7 C)  (!) 97.4 F (36.3 C) 98.4 F (36.9 C)  TempSrc: Oral  Axillary Oral  SpO2: 99% 100% 100% 100%  Weight:      Height:       Body mass index is 25.69 kg/m.  Physical Exam  Constitutional: She is oriented to person, place, and time.  She is resting quietly in bed.  HENT:  Mouth/Throat: No oropharyngeal exudate.  Cardiovascular: Normal rate and regular rhythm.   No murmur heard. Pulmonary/Chest: Effort normal. She has no wheezes. She has no rales.  Abdominal: Soft. She exhibits no distension.  Musculoskeletal:  There is a bandage over her left forearm abscess incision site. She has multiple areas of cellulitis some with fluctuance tracking up her right forearm. She has tenderness to palpation of her left shoulder without erythema, warmth or swelling. She has a healed incision over her right knee. She  has swelling, redness and tenderness over the prepatellar bursa.  Neurological: She is alert and oriented to person, place, and time.  Skin: No rash noted.  Psychiatric: Mood and affect normal.    Lab Results Lab Results  Component Value Date   WBC 8.3 09/22/2016   HGB 10.4 (L) 09/22/2016   HCT 31.0 (L) 09/22/2016   MCV 87.6 09/22/2016   PLT 317 09/22/2016    Lab Results  Component Value Date   CREATININE 0.69 09/23/2016   BUN 7 09/23/2016   NA 141 09/23/2016   K 3.2 (L) 09/23/2016    CL 109 09/23/2016   CO2 24 09/23/2016    Lab Results  Component Value Date   ALT QUANTITY NOT SUFFICIENT, UNABLE TO PERFORM TEST 09/22/2016   AST QUANTITY NOT SUFFICIENT, UNABLE TO PERFORM TEST 09/22/2016   ALKPHOS QUANTITY NOT SUFFICIENT, UNABLE TO PERFORM TEST 09/22/2016   BILITOT QUANTITY NOT SUFFICIENT, UNABLE TO PERFORM TEST 09/22/2016     Microbiology: Recent Results (from the past 240 hour(s))  Urine Culture     Status: Abnormal   Collection Time: 09/20/16  3:47 PM  Result Value Ref Range Status   Specimen Description URINE, RANDOM  Final   Special Requests NONE  Final   Culture >=100,000 COLONIES/mL ENTEROBACTER AEROGENES (A)  Final   Report Status 09/22/2016 FINAL  Final   Organism ID, Bacteria ENTEROBACTER AEROGENES (A)  Final      Susceptibility   Enterobacter aerogenes - MIC*    CEFAZOLIN RESISTANT Resistant     CEFTRIAXONE <=1 SENSITIVE Sensitive     CIPROFLOXACIN <=0.25 SENSITIVE Sensitive     GENTAMICIN <=1 SENSITIVE Sensitive     IMIPENEM 0.5 SENSITIVE Sensitive     NITROFURANTOIN 32 SENSITIVE Sensitive     TRIMETH/SULFA <=20 SENSITIVE Sensitive     PIP/TAZO <=4 SENSITIVE Sensitive     * >=100,000 COLONIES/mL ENTEROBACTER AEROGENES  Blood culture (routine x 2)     Status: Abnormal   Collection Time: 09/20/16  3:51 PM  Result Value Ref Range Status   Specimen Description BLOOD LEFT ANTECUBITAL  Final   Special Requests   Final    BOTTLES DRAWN AEROBIC AND ANAEROBIC Blood Culture adequate volume   Culture  Setup Time   Final    GRAM POSITIVE COCCI IN CHAINS IN BOTH AEROBIC AND ANAEROBIC BOTTLES CRITICAL RESULT CALLED TO, READ BACK BY AND VERIFIED WITH: J Tampico,PHARMD AT 1610 09/21/16 BY L BENFIELD    Culture (A)  Final    GROUP A STREP (S.PYOGENES) ISOLATED HEALTH DEPARTMENT NOTIFIED SUSCEPTIBILITIES PERFORMED ON PREVIOUS CULTURE WITHIN THE LAST 5 DAYS.    Report Status 09/23/2016 FINAL  Final  Blood culture (routine x 2)     Status: Abnormal    Collection Time: 09/20/16  3:59 PM  Result Value Ref Range Status   Specimen Description BLOOD LEFT ANTECUBITAL  Final   Special Requests   Final    BOTTLES DRAWN AEROBIC AND ANAEROBIC Blood Culture adequate volume   Culture  Setup Time   Final    GRAM POSITIVE COCCI IN CHAINS IN BOTH AEROBIC AND ANAEROBIC BOTTLES CRITICAL RESULT CALLED TO, READ BACK BY AND VERIFIED WITH: J Pomeroy,PHARMD AT 9604 09/21/16 BY L BENFIELD    Culture (A)  Final    GROUP A STREP (S.PYOGENES) ISOLATED HEALTH DEPARTMENT NOTIFIED CANDIDA KRUSEI DETECTED ON BCID. NOT RECOVERED IN CULTURE    Report Status 09/23/2016 FINAL  Final   Organism ID, Bacteria GROUP A STREP (S.PYOGENES) ISOLATED  Final      Susceptibility   Group a strep (s.pyogenes) isolated - MIC*    PENICILLIN <=0.06 SENSITIVE Sensitive     CEFTRIAXONE <=0.12 SENSITIVE Sensitive     ERYTHROMYCIN <=0.12 SENSITIVE Sensitive     LEVOFLOXACIN 0.5 SENSITIVE Sensitive     VANCOMYCIN 0.5 SENSITIVE Sensitive     * GROUP A STREP (S.PYOGENES) ISOLATED  Blood Culture ID Panel (Reflexed)     Status: Abnormal   Collection Time: 09/20/16  3:59 PM  Result Value Ref Range Status   Enterococcus species NOT DETECTED NOT DETECTED Final   Listeria monocytogenes NOT DETECTED NOT DETECTED Final   Staphylococcus species NOT DETECTED NOT DETECTED Final   Staphylococcus aureus NOT DETECTED NOT DETECTED Final   Streptococcus species DETECTED (A) NOT DETECTED Final    Comment: CRITICAL RESULT CALLED TO, READ BACK BY AND VERIFIED WITH: J Triumph,PHARMD AT 4098 09/21/16 BY L BENFIELD    Streptococcus agalactiae NOT DETECTED NOT DETECTED Final   Streptococcus pneumoniae NOT DETECTED NOT DETECTED Final   Streptococcus pyogenes DETECTED (A) NOT DETECTED Final    Comment: CRITICAL RESULT CALLED TO, READ BACK BY AND VERIFIED WITH: J Davy,PHARMD AT 1191 09/21/16 BY L BENFIELD    Acinetobacter baumannii NOT DETECTED NOT DETECTED Final   Enterobacteriaceae species NOT DETECTED NOT  DETECTED Final   Enterobacter cloacae complex NOT DETECTED NOT DETECTED Final   Escherichia coli NOT DETECTED NOT DETECTED Final   Klebsiella oxytoca NOT DETECTED NOT DETECTED Final   Klebsiella pneumoniae NOT DETECTED NOT DETECTED Final   Proteus species NOT DETECTED NOT DETECTED Final   Serratia marcescens NOT DETECTED NOT DETECTED Final   Haemophilus influenzae NOT DETECTED NOT DETECTED Final   Neisseria meningitidis NOT DETECTED NOT DETECTED Final   Pseudomonas aeruginosa NOT DETECTED NOT DETECTED Final   Candida albicans NOT DETECTED NOT DETECTED Final   Candida glabrata NOT DETECTED NOT DETECTED Final   Candida krusei DETECTED (A) NOT DETECTED Final    Comment: CRITICAL RESULT CALLED TO, READ BACK BY AND VERIFIED WITH: J [place],PHARMD AT 4782 09/21/16 BY L BENFIELD    Candida parapsilosis NOT DETECTED NOT DETECTED Final   Candida tropicalis NOT DETECTED NOT DETECTED Final  Wound or Superficial Culture     Status: None   Collection Time: 09/20/16  6:26 PM  Result Value Ref Range Status   Specimen Description ABSCESS LEFT FOREARM  Final   Special Requests NONE  Final   Gram Stain   Final    ABUNDANT WBC PRESENT,BOTH PMN AND MONONUCLEAR RARE GRAM POSITIVE COCCI IN PAIRS    Culture   Final    ABUNDANT METHICILLIN RESISTANT STAPHYLOCOCCUS AUREUS ABUNDANT GROUP A STREP (S.PYOGENES) ISOLATED    Report Status 09/23/2016 FINAL  Final   Organism ID, Bacteria METHICILLIN RESISTANT STAPHYLOCOCCUS AUREUS  Final      Susceptibility   Methicillin resistant staphylococcus aureus - MIC*    CIPROFLOXACIN <=0.5 SENSITIVE Sensitive     ERYTHROMYCIN >=8 RESISTANT Resistant     GENTAMICIN <=0.5 SENSITIVE Sensitive     OXACILLIN >=4 RESISTANT Resistant     TETRACYCLINE <=1 SENSITIVE Sensitive     VANCOMYCIN <=0.5 SENSITIVE Sensitive     TRIMETH/SULFA <=10 SENSITIVE Sensitive     CLINDAMYCIN <=0.25 SENSITIVE Sensitive     RIFAMPIN <=0.5 SENSITIVE Sensitive     Inducible Clindamycin NEGATIVE  Sensitive     * ABUNDANT METHICILLIN RESISTANT STAPHYLOCOCCUS AUREUS  Culture, blood (routine x 2)     Status: None (Preliminary result)  Collection Time: 09/22/16  5:25 AM  Result Value Ref Range Status   Specimen Description BLOOD LEFT HAND  Final   Special Requests IN PEDIATRIC BOTTLE Blood Culture adequate volume  Final   Culture NO GROWTH 1 DAY  Final   Report Status PENDING  Incomplete    Cliffton Asters, MD [facility] [phone] pager   [phone] cell 09/23/2016, 4:16 PM

## 2016-09-23 NOTE — Progress Notes (Addendum)
Subjective: Shannon Dawson is doing well this morning. She is still experiencing shoulder pain and her left foot has become progressively more painful and swollen. She has noticed a change on her right forearm with the appearance of "bumps" that are also painful. She currently acknowledges subjective fever and chills. Denies any blurry vision/visual changes, N/V, abdominal pain, soa, chest pain, palpitations/abnormal heart beats.   ROS negative, except what is mentioned above.    Objective:  Vital signs in last 24 hours: Vitals:   09/22/16 1158 09/22/16 1826 09/22/16 2122 09/23/16 0646  BP: 106/66 112/77 125/73 108/66  Pulse: 62 62 63 (!) 59  Resp: 18     Temp: 98.1 F (36.7 C)  (!) 97.4 F (36.3 C) 98.4 F (36.9 C)  TempSrc: Oral  Axillary Oral  SpO2: 99% 100% 100% 100%  Weight:      Height:       Physical Exam  Constitutional: She is oriented to person, place, and time and well-developed, well-nourished, and in no distress.  HENT:  Head: Normocephalic and atraumatic.  Eyes: Conjunctivae are normal. Pupils are equal, round, and reactive to light.  Cardiovascular: Normal rate, regular rhythm, normal heart sounds and intact distal pulses.  Exam reveals no friction rub.   No murmur heard. Pulmonary/Chest: Effort normal and breath sounds normal. No respiratory distress. She has no wheezes. She has no rales.  Abdominal: Soft. Bowel sounds are normal. She exhibits no distension. There is no tenderness. There is no rebound.  Musculoskeletal:  Erythema and swelling on the right forearm has improved from prior exams; however, now has 4 fluctuant masses of varying sizes on her right forearm. Erythema and swelling of the left foot has progressed since prior exam extending outside the demarcated region on 09/21/2016. Erythema and swelling overlying the right patella has increased beyond the demarcated region on 09/20/2016.  Left forearm at site of I&D appears to be healing well. No other abnormalities  noted including, splinter hemorrhages or osler nodes.   Neurological: She is alert and oriented to person, place, and time.  Psychiatric: Memory, affect and judgment normal.   Assessment/Plan:  Principal Problem:   Fungemia Active Problems:   Cellulitis   Cellulitis of right arm   Cellulitis of right knee   Abscess of left forearm   Hypokalemia   Fall   Acute pain of right shoulder   Bacteremia  1. Fungemia and Bacteremia. 2/2 Strep pyogenes and Candida krusei  - ID consulted. Appreciate the recommendations; continue Cefazolin and Eraxis. Vancomycin added due to wound cultures growing MRSA - TEE to be schedule within the next day  - Ortho consulted due to possible abscesses on the right forearm and cellulitis overlying hardware in the right patella. NPO at midnight for possible I&D and TEE - Opthalmology consulted for eye exam to rule out endophthalmitis   2. Asymptomatic Bacteriuria. Enterobacter aerogenes - Continue Cefazolin   3. IV Drug Abuse. Heroin  - HIV and Hepatitis B negative/non-reactive  - Hepatitis C quant not detectable, prior positive reaction on 08/29/2011  4. Right shoulder pain.  - Continue Tylenol PRN  - Toradol 30 mg IV q6hrs  - Oxycodone IR 5 mg q4hrs  - Will switch to methadone once out of acute pain.   Diet: Regular IVF: 125 mL/hr NS VTE ppx: Lovenox  Dispo: Unable to say how long patient may be here. Will wait until more data is collected.    Shannon Dredge, MD 09/23/2016, 10:34 AM Pager: My Pager: [phone]  Internal Medicine Attending  Date: 09/23/2016  Patient name: Shannon Dawson Medical record number: [id number] Date of birth: 03/12/1976 Age: 41 y.o. Gender: female  I saw and evaluated the patient. I reviewed the resident's note by Dr. Caron PresumeHelberg and I agree with the resident's findings and plans as documented in his progress note.  I appreciate the input of orthopedic surgery and infectious diseases. As recommended we will discontinue the  cefazolin and continue the vancomycin and Eraxis. The echocardiogram is pending as is the ophthalmology examination. We will try to arrange aspiration of the prepatellar bursa as recommended by infectious disease. Yesterday's blood cultures show no growth thus far and we have also obtain blood cultures from today for which we will also follow-up. With this information we should be better able to determine how long she will require IV antifungals and antibiotics before switching to an oral course.

## 2016-09-23 NOTE — Progress Notes (Signed)
Orthopedic Tech Progress Note Patient Details:  Shannon SnareMary Dawson 22-Jul-1975 [id number]  Ortho Devices Type of Ortho Device: Shoulder immobilizer Ortho Device/Splint Location: applied sling/shoulder immobilizer to pt right arm.  pt tolerated application very well.  however pt stated she prefer to move arm to help with pain.  Right Arm.  Ortho Device/Splint Interventions: Application, Adjustment   Alvina ChouWilliams, Deedra Pro C 09/23/2016, 12:47 PM

## 2016-09-23 NOTE — Progress Notes (Signed)
Physical Therapy Treatment Patient Details Name: Shannon Dawson MRN: [id number] DOB: Dec 03, 1975 Today's Date: 09/23/2016    History of Present Illness 41 y.o female who reported to the ED after falling down and embankment and hitting her R knee.  Pt reports R knee and R shoulder pain from fall.  Upon further evaluation: Fungemia, R knee cellulitis, R forearm cellulitis (from admitted recent IV drug use), L forearm abcess ( now s/p I/D), and hypokalemia with a PMHx significant for IV drug use, anemia, anxiety, chronic low back pain, chronic R knee pain, depression, alcohol abuse and kidney stones.     PT Comments    Pt presents in bed and agitated when asked to participate in PT tx.  Pt performing remarkably better than initial eval and at the time she has met all her goals.  Will inform supervising PT of patient's current level of function and need for d/c from PT services.  Pt educated to continue ambulation and ROM exercises to R knee.     Follow Up Recommendations  No PT follow up     Equipment Recommendations   (none.  )    Recommendations for Other Services       Precautions / Restrictions Precautions Precautions: None Restrictions Weight Bearing Restrictions: No    Mobility  Bed Mobility Overal bed mobility: Needs Assistance;Independent Bed Mobility: Supine to Sit;Sit to Supine     Supine to sit: Modified independent (Device/Increase time) Sit to supine: Modified independent (Device/Increase time)   General bed mobility comments: No assistance needed.  Pt able to perform independently.    Transfers Overall transfer level: Modified independent Equipment used: None Transfers: Sit to/from Stand Sit to Stand: Modified independent (Device/Increase time)         General transfer comment: No assistance needed.  Good technique performed.    Ambulation/Gait Ambulation/Gait assistance: Independent Ambulation Distance (Feet): 600 Feet Assistive device: None Gait  Pattern/deviations: Step-through pattern;WFL(Within Functional Limits)   Gait velocity interpretation: at or above normal speed for age/gender General Gait Details: NO assistance needed.  Pt with decreased reciprocal armswing on R due to holding her gown together despite it being closed.  Pt appears to be functioning at baseline.     Stairs Stairs:  (refused stair training, reports the home she is staying in has a level entry.  )          Wheelchair Mobility    Modified Rankin (Stroke Patients Only)       Balance                                            Cognition Arousal/Alertness: Awake/alert Behavior During Therapy: WFL for tasks assessed/performed Overall Cognitive Status: Within Functional Limits for tasks assessed                                        Exercises General Exercises - Lower Extremity Quad Sets: AROM;Right;10 reps;Supine Heel Slides: AROM;Right;10 reps;Supine    General Comments        Pertinent Vitals/Pain Pain Assessment: 0-10 Pain Score: 5  Pain Location: R knee and shoulder.   Pain Descriptors / Indicators: Discomfort Pain Intervention(s): Monitored during session;Repositioned    Home Living  Prior Function            PT Goals (current goals can now be found in the care plan section) Acute Rehab PT Goals Patient Stated Goal: To get the hell out of the hospital Additional Goals Additional Goal #1: Pt will increase knee extension to 5 degrees and knee flexion to 100 degrees Progress towards PT goals: Goals met/education completed, patient discharged from PT    Frequency    Min 3X/week      PT Plan Current plan remains appropriate    Co-evaluation              AM-PAC PT "6 Clicks" Daily Activity  Outcome Measure  Difficulty turning over in bed (including adjusting bedclothes, sheets and blankets)?: None Difficulty moving from lying on back to sitting on  the side of the bed? : None Difficulty sitting down on and standing up from a chair with arms (e.g., wheelchair, bedside commode, etc,.)?: None Help needed moving to and from a bed to chair (including a wheelchair)?: None Help needed walking in hospital room?: None Help needed climbing 3-5 steps with a railing? : None 6 Click Score: 24    End of Session Equipment Utilized During Treatment: Gait belt Activity Tolerance: Patient tolerated treatment well Patient left: in bed;with call bell/phone within reach Nurse Communication: Mobility status;Patient requests pain meds (Iv beeping) PT Visit Diagnosis: Other abnormalities of gait and mobility (R26.89);Pain;Difficulty in walking, not elsewhere classified (R26.2) Pain - Right/Left: Right Pain - part of body: Knee     Time: 2725-3664 PT Time Calculation (min) (ACUTE ONLY): 19 min  Charges:  $Gait Training: 8-22 mins                    G Codes:       Governor Rooks, PTA pager [facility] 09/23/2016, 12:05 PM

## 2016-09-23 NOTE — Progress Notes (Signed)
Spoke with case management, requesting PICC line placement. Will hold off until we know the needed duration of treatment after the TEE is complete.

## 2016-09-23 NOTE — Consult Note (Signed)
Reason for Consult:Knee pain/forearm abscess Referring Physician: L Braylynn Shannon Dawson is an 41 y.o. female.  HPI: Shannon Shannon Dawson. She has been using regularly up until about a week ago. She generally injects her hands equally. She is RHD. She fell on 7/6, taking the brunt of the force on her knee. She also tumbled and somehow hurt her shoulder in the process as well. She came to the ED where x-rays were normal. However, she was noted to have some upper extremity abscesses and cellulitis in addition to the red knee and was admitted by the medical service. Since admission she says her knee has gotten much better. The forearm has gotten a little worse and she's developed some cellulitis in her left foot as well. She has been diagnosed with bacteremia/fungemia and ID is involved.  Past Medical History:  Diagnosis Date  . Anemia    hx  . Anxiety   . Chronic lower back pain   . Chronic pain of right knee    post knee injury 2011, pins and wires, rod  . Depression    hx  . GAD (generalized anxiety disorder)   . Headache(784.0)    otc med prn  . History of alcohol Shannon Dawson    pt verbalized by pt no alcohol since 2011  . History of heroin Shannon Dawson    per epic documentation and pt detoxed 2011--  verbalized by pt clean since 2011  . History of kidney stones   . Irregular menstrual cycle   . Nephrolithiasis    bilateral non-obstructive per renal u/s 05-27-2015  . OCD (obsessive compulsive disorder)   . Ovarian cyst    BILATERAL  . Renal cyst, right   . Right ureteral stone   . SVD (spontaneous vaginal delivery)    x 2    Past Surgical History:  Procedure Laterality Date  . CYSTO/  RIGHT URETEROSCOPIC LASER LITHOTRIPSY STONE EXTRACTION  03-14-2008  . CYSTOSCOPY WITH RETROGRADE PYELOGRAM, URETEROSCOPY AND STENT PLACEMENT Right 05/19/2015   Procedure: CYSTOSCOPY WITH RETROGRADE PYELOGRAM, URETEROSCOPY AND STENT PLACEMENT;  Surgeon: Cleon Gustin, MD;  Location: [facility];   Service: Urology;  Laterality: Right;  . CYSTOSCOPY WITH RETROGRADE PYELOGRAM, URETEROSCOPY AND STENT PLACEMENT Right 06/12/2015   Procedure: CYSTOSCOPY WITH RETROGRADE PYELOGRAM, URETEROSCOPY, STONE EXTRACTION AND STENT EXCHANGE;  Surgeon: Cleon Gustin, MD;  Location: [facility];  Service: Urology;  Laterality: Right;  . HOLMIUM LASER APPLICATION Right 09/04/5091   Procedure: HOLMIUM LASER APPLICATION;  Surgeon: Cleon Gustin, MD;  Location: [facility];  Service: Urology;  Laterality: Right;  . I & D OPEN RIGHT KNEE TIBIA/ FIBULA FX'S/  IM NAILING TIBIA/  CLOSURE TRAUMATIC WOUNDS  07-18-2009  . LAPAROSCOPIC CHOLECYSTECTOMY  05-13-2009  . LAPAROTOMY Right 11/21/2015   Procedure: LAPAROTOMY, RIght Salpingo Oophrectomy, Right Ovarian Cystectomy;  Surgeon: Lavonia Drafts, MD;  Location: [facility];  Service: Gynecology;  Laterality: Right;  . MULTIPLE TOOTH EXTRACTIONS     cracked and chipped lower teeth, no upper teeth  . ORIF RIGHT PATELLA FX  2013  . WISDOM TOOTH EXTRACTION      History reviewed. No pertinent family history.  Social History:  reports that she has been smoking Cigarettes.  She has a 25.00 pack-year smoking history. She has never used smokeless tobacco. She reports that she uses drugs, including Heroin, Cocaine, Marijuana, and IV, about 7 times per week. She reports that she does not drink alcohol.  Allergies:  Allergies  Allergen Reactions  . Tramadol Hives and Itching    Severe  . Vicodin [Hydrocodone-Acetaminophen] Itching    Itching is severe.     Medications: I have reviewed the patient's current medications.  Results for orders placed or performed during the hospital encounter of 09/20/16 (from the past 48 hour(s))  Basic metabolic panel     Status: Abnormal   Collection Time: 09/21/16  6:14 PM  Result Value Ref Range   Sodium 134 (Shannon) 135 - 145 mmol/Shannon   Potassium 3.8 3.5 - 5.1 mmol/Shannon    Comment: SLIGHT HEMOLYSIS    Chloride 103 101 - 111 mmol/Shannon   CO2 19 (Shannon) 22 - 32 mmol/Shannon   Glucose, Bld 122 (H) 65 - 99 mg/dL   BUN 9 6 - 20 mg/dL   Creatinine, Ser 0.75 0.44 - 1.00 mg/dL   Calcium 8.7 (Shannon) 8.9 - 10.3 mg/dL   GFR calc non Af Amer >60 >60 mL/min   GFR calc Af Amer >60 >60 mL/min    Comment: (NOTE) The eGFR has been calculated using the CKD EPI equation. This calculation has not been validated in all clinical situations. eGFR's persistently <60 mL/min signify possible Chronic Kidney Disease.    Anion gap 12 5 - 15  Comprehensive metabolic panel     Status: Abnormal   Collection Time: 09/22/16  5:44 AM  Result Value Ref Range   Sodium 136 135 - 145 mmol/Shannon   Potassium 5.2 (H) 3.5 - 5.1 mmol/Shannon    Comment: HEMOLYSIS AT THIS LEVEL MAY AFFECT RESULT   Chloride 107 101 - 111 mmol/Shannon   CO2 19 (Shannon) 22 - 32 mmol/Shannon   Glucose, Bld 91 65 - 99 mg/dL   BUN 11 6 - 20 mg/dL   Creatinine, Ser 0.77 0.44 - 1.00 mg/dL   Calcium 8.8 (Shannon) 8.9 - 10.3 mg/dL   Total Protein 6.2 (Shannon) 6.5 - 8.1 g/dL   Albumin 1.9 (Shannon) 3.5 - 5.0 g/dL   AST QUANTITY NOT SUFFICIENT, UNABLE TO PERFORM TEST 15 - 41 U/Shannon   ALT QUANTITY NOT SUFFICIENT, UNABLE TO PERFORM TEST 14 - 54 U/Shannon   Alkaline Phosphatase QUANTITY NOT SUFFICIENT, UNABLE TO PERFORM TEST 38 - 126 U/Shannon   Total Bilirubin QUANTITY NOT SUFFICIENT, UNABLE TO PERFORM TEST 0.3 - 1.2 mg/dL   GFR calc non Af Amer >60 >60 mL/min   GFR calc Af Amer >60 >60 mL/min    Comment: (NOTE) The eGFR has been calculated using the CKD EPI equation. This calculation has not been validated in all clinical situations. eGFR's persistently <60 mL/min signify possible Chronic Kidney Disease.    Anion gap 10 5 - 15  CBC     Status: Abnormal   Collection Time: 09/22/16  7:34 AM  Result Value Ref Range   WBC 8.3 4.0 - 10.5 K/uL   RBC 3.54 (Shannon) 3.87 - 5.11 MIL/uL   Hemoglobin 10.4 (Shannon) 12.0 - 15.0 g/dL   HCT 31.0 (Shannon) 36.0 - 46.0 %   MCV 87.6 78.0 - 100.0 fL   MCH 29.4 26.0 - 34.0 pg   MCHC 33.5 30.0 -  36.0 g/dL   RDW 17.5 (H) 11.5 - 15.5 %   Platelets 317 150 - 400 K/uL  Basic metabolic panel     Status: Abnormal   Collection Time: 09/23/16  5:58 AM  Result Value Ref Range   Sodium 141 135 - 145 mmol/Shannon   Potassium 3.2 (Shannon) 3.5 - 5.1 mmol/Shannon   Chloride  109 101 - 111 mmol/Shannon   CO2 24 22 - 32 mmol/Shannon   Glucose, Bld 95 65 - 99 mg/dL   BUN 7 6 - 20 mg/dL   Creatinine, Ser 0.69 0.44 - 1.00 mg/dL   Calcium 8.2 (Shannon) 8.9 - 10.3 mg/dL   GFR calc non Af Amer >60 >60 mL/min   GFR calc Af Amer >60 >60 mL/min    Comment: (NOTE) The eGFR has been calculated using the CKD EPI equation. This calculation has not been validated in all clinical situations. eGFR's persistently <60 mL/min signify possible Chronic Kidney Disease.    Anion gap 8 5 - 15    No results found.  Review of Systems  Constitutional: Negative for weight loss.  HENT: Negative for ear discharge, ear pain, hearing loss and tinnitus.   Eyes: Negative for blurred vision, double vision, photophobia and pain.  Respiratory: Negative for cough, sputum production and shortness of breath.   Cardiovascular: Negative for chest pain.  Gastrointestinal: Negative for abdominal pain, nausea and vomiting.  Genitourinary: Negative for dysuria, flank pain, frequency and urgency.  Musculoskeletal: Positive for joint pain (Right shoulder, knee) and myalgias (Right forearm). Negative for back pain, falls and neck pain.  Neurological: Positive for tingling (BLE). Negative for dizziness, sensory change, focal weakness, loss of consciousness and headaches.  Endo/Heme/Allergies: Does not bruise/bleed easily.  Psychiatric/Behavioral: Negative for depression, memory loss and substance Shannon Dawson. The patient is not nervous/anxious.    Blood pressure 108/66, pulse (!) 59, temperature 98.4 F (36.9 C), temperature source Oral, resp. rate 18, height _0  (1.6 m), weight 65.8 kg (145 lb), last menstrual period 09/18/2016, SpO2 100 %. Physical Exam   Constitutional: She appears well-developed and well-nourished. No distress.  HENT:  Head: Normocephalic.  Eyes: Conjunctivae are normal. Right eye exhibits no discharge. Left eye exhibits no discharge. No scleral icterus.  Cardiovascular: Normal rate and regular rhythm.   Respiratory: Effort normal. No respiratory distress.  Musculoskeletal:  Right shoulder, elbow, wrist, digits- mod TTP along clav and AC joint, minimal TTP ant/lat shoulder. Volar forearm with confluent erythema and induration, mild TTP, no definitive fluctuance  Sens  Ax/R/M/U intact  Mot   Ax/ R/ PIN/ M/ AIN/ U intact  Rad 2+  Left shoulder, elbow, wrist, digits- small would volar wrist macerated, nontender, no instability, no blocks to motion  Sens  Ax/R/M/U intact  Mot   Ax/ R/ PIN/ M/ AIN/ U intact  Rad 2+  RLE No traumatic wounds, ecchymosis, or rash  Ant knee TTP, erythematous, mild warmth, swelling  No effusions  Sens DPN, SPN, TN intact  Motor EHL, ext, flex, evers 5/5  DP 2+, PT 2+, No significant edema   LLE No traumatic wounds, ecchymosis, or rash  Nontender  No effusions  Knee stable to varus/ valgus and anterior/posterior stress  Sens DPN paresthetic, SPN, TN intact  Motor EHL, ext, flex, evers 5/5  DP 2+, PT 2+, 2+ NP edema, erythema over mid/forefoot  Neurological: She is alert.  Skin: Skin is warm and dry. She is not diaphoretic.  Psychiatric: She has a normal mood and affect. Her behavior is normal.    Assessment/Plan: Fall Right shoulder pain -- X-rays reviewed, no abnormalities. Suspect contusion vs mild AC injury vs occult clav fx (unlikely). WBAT, ROM as tolerated. Can use sling for comfort. Right forearm cellulitis -- Will get [place] to see if there's any drainable collection, only really suspicious about one of the areas though there could be some deep. Right  knee pain -- Traumatic vs septic bursitis most likely. As this is improving would not recommend any intervention at this point. May  need aspiration if it fails to improve completely. Should it worsen would be concerned about septic joint; please call if this occurs. She notes no problems with pain or function of knee prior to this fall so do not feel hardware removal indicated. Left foot cellulitis -- Monitor for progression  Heroin Shannon Dawson [place], PA-C Orthopedic Surgery [phone] 09/23/2016, 10:50 AM

## 2016-09-23 NOTE — Progress Notes (Signed)
Patient transferred to 5W-32 per doctor's order

## 2016-09-24 ENCOUNTER — Encounter (HOSPITAL_COMMUNITY): Payer: Self-pay

## 2016-09-24 ENCOUNTER — Encounter (HOSPITAL_COMMUNITY)
Admission: EM | Disposition: A | Discharge status: Home / Self Care | Payer: Self-pay | Source: Home / Self Care | Attending: Internal Medicine

## 2016-09-24 ENCOUNTER — Inpatient Hospital Stay (HOSPITAL_COMMUNITY): Payer: Self-pay | Admitting: Certified Registered Nurse Anesthetist

## 2016-09-24 ENCOUNTER — Inpatient Hospital Stay (HOSPITAL_COMMUNITY): Payer: Self-pay | Admitting: Anesthesiology

## 2016-09-24 ENCOUNTER — Inpatient Hospital Stay (HOSPITAL_COMMUNITY)
Admit: 2016-09-24 | Discharge: 2016-09-24 | Disposition: A | Discharge status: Home / Self Care | Payer: Self-pay | Attending: Physician Assistant | Admitting: Physician Assistant

## 2016-09-24 DIAGNOSIS — R7881 Bacteremia: Secondary | ICD-10-CM

## 2016-09-24 HISTORY — PX: TEE WITHOUT CARDIOVERSION: SHX5443

## 2016-09-24 HISTORY — PX: INCISION AND DRAINAGE ABSCESS: SHX5864

## 2016-09-24 LAB — GLUCOSE, CAPILLARY: Glucose-Capillary: 81 mg/dL (ref 65–99)

## 2016-09-24 LAB — COMPREHENSIVE METABOLIC PANEL
ALBUMIN: 1.7 g/dL — AB (ref 3.5–5.0)
ALT: 10 U/L — ABNORMAL LOW (ref 14–54)
ANION GAP: 8 (ref 5–15)
AST: 17 U/L (ref 15–41)
Alkaline Phosphatase: 67 U/L (ref 38–126)
BUN: <5 mg/dL — ABNORMAL LOW (ref 6–20)
CHLORIDE: 110 mmol/L (ref 101–111)
CO2: 23 mmol/L (ref 22–32)
Calcium: 8.1 mg/dL — ABNORMAL LOW (ref 8.9–10.3)
Creatinine, Ser: 0.62 mg/dL (ref 0.44–1.00)
GFR calc Af Amer: >60 mL/min (ref >60)
GFR calc non Af Amer: >60 mL/min (ref >60)
GLUCOSE: 91 mg/dL (ref 65–99)
POTASSIUM: 3.3 mmol/L — AB (ref 3.5–5.1)
SODIUM: 141 mmol/L (ref 135–145)
Total Bilirubin: 0.3 mg/dL (ref 0.3–1.2)
Total Protein: 6.2 g/dL — ABNORMAL LOW (ref 6.5–8.1)

## 2016-09-24 LAB — CBC
HEMATOCRIT: 29.4 % — AB (ref 36.0–46.0)
Hemoglobin: 9.4 g/dL — ABNORMAL LOW (ref 12.0–15.0)
MCH: 28.4 pg (ref 26.0–34.0)
MCHC: 32 g/dL (ref 30.0–36.0)
MCV: 88.8 fL (ref 78.0–100.0)
Platelets: 438 10*3/uL — ABNORMAL HIGH (ref 150–400)
RBC: 3.31 MIL/uL — ABNORMAL LOW (ref 3.87–5.11)
RDW: 18.3 % — ABNORMAL HIGH (ref 11.5–15.5)
WBC: 8.6 10*3/uL (ref 4.0–10.5)

## 2016-09-24 SURGERY — ECHOCARDIOGRAM, TRANSESOPHAGEAL
Anesthesia: Monitor Anesthesia Care

## 2016-09-24 SURGERY — INCISION AND DRAINAGE, ABSCESS
Anesthesia: General | Site: Arm Lower | Laterality: Right

## 2016-09-24 MED ORDER — SODIUM CHLORIDE 0.9 % IR SOLN
Status: DC | PRN
  Administered 2016-09-24: 1000 mL

## 2016-09-24 MED ORDER — EPHEDRINE SULFATE 50 MG/ML IJ SOLN
INTRAMUSCULAR | Status: DC | PRN
  Administered 2016-09-24: 10 mg via INTRAVENOUS

## 2016-09-24 MED ORDER — PROPOFOL 10 MG/ML IV BOLUS
INTRAVENOUS | Status: AC
  Filled 2016-09-24: qty 20

## 2016-09-24 MED ORDER — TROPICAMIDE 1 % OP SOLN
1.0000 [drp] | Freq: Once | OPHTHALMIC | Status: AC
  Administered 2016-09-24: 1 [drp] via OPHTHALMIC
  Filled 2016-09-24: qty 2

## 2016-09-24 MED ORDER — LIDOCAINE 2% (20 MG/ML) 5 ML SYRINGE
INTRAMUSCULAR | Status: DC | PRN
  Administered 2016-09-24: 100 mg via INTRAVENOUS

## 2016-09-24 MED ORDER — MORPHINE SULFATE (PF) 4 MG/ML IV SOLN
2.0000 mg | INTRAVENOUS | Status: DC | PRN
  Administered 2016-09-24 – 2016-09-27 (×18): 2 mg via INTRAVENOUS
  Filled 2016-09-24 (×18): qty 1

## 2016-09-24 MED ORDER — FENTANYL CITRATE (PF) 100 MCG/2ML IJ SOLN
25.0000 ug | INTRAMUSCULAR | Status: DC | PRN
  Administered 2016-09-24 (×2): 50 ug via INTRAVENOUS

## 2016-09-24 MED ORDER — PROPOFOL 10 MG/ML IV BOLUS
INTRAVENOUS | Status: DC | PRN
  Administered 2016-09-24 (×2): 30 mg via INTRAVENOUS
  Administered 2016-09-24: 40 mg via INTRAVENOUS
  Administered 2016-09-24: 30 mg via INTRAVENOUS
  Administered 2016-09-24: 40 mg via INTRAVENOUS

## 2016-09-24 MED ORDER — FENTANYL CITRATE (PF) 100 MCG/2ML IJ SOLN
INTRAMUSCULAR | Status: AC
  Administered 2016-09-24: 50 ug via INTRAVENOUS
  Filled 2016-09-24: qty 2

## 2016-09-24 MED ORDER — MIDAZOLAM HCL 2 MG/2ML IJ SOLN
INTRAMUSCULAR | Status: AC
  Filled 2016-09-24: qty 2

## 2016-09-24 MED ORDER — KETAMINE HCL-SODIUM CHLORIDE 100-0.9 MG/10ML-% IV SOSY
PREFILLED_SYRINGE | INTRAVENOUS | Status: AC
  Filled 2016-09-24: qty 10

## 2016-09-24 MED ORDER — FENTANYL CITRATE (PF) 250 MCG/5ML IJ SOLN
INTRAMUSCULAR | Status: AC
  Filled 2016-09-24: qty 5

## 2016-09-24 MED ORDER — LACTATED RINGERS IV SOLN
INTRAVENOUS | Status: DC | PRN
  Administered 2016-09-24: 13:00:00 via INTRAVENOUS

## 2016-09-24 MED ORDER — KETOROLAC TROMETHAMINE 30 MG/ML IJ SOLN
30.0000 mg | Freq: Once | INTRAMUSCULAR | Status: DC | PRN
  Administered 2016-09-24: 30 mg via INTRAVENOUS

## 2016-09-24 MED ORDER — FENTANYL CITRATE (PF) 100 MCG/2ML IJ SOLN
INTRAMUSCULAR | Status: DC | PRN
  Administered 2016-09-24 (×5): 50 ug via INTRAVENOUS

## 2016-09-24 MED ORDER — MIDAZOLAM HCL 5 MG/5ML IJ SOLN
INTRAMUSCULAR | Status: DC | PRN
  Administered 2016-09-24: 2 mg via INTRAVENOUS

## 2016-09-24 MED ORDER — LIDOCAINE HCL (CARDIAC) 20 MG/ML IV SOLN
INTRAVENOUS | Status: DC | PRN
  Administered 2016-09-24: 100 mg via INTRAVENOUS

## 2016-09-24 MED ORDER — KETOROLAC TROMETHAMINE 30 MG/ML IJ SOLN
INTRAMUSCULAR | Status: AC
  Administered 2016-09-24: 30 mg via INTRAVENOUS
  Filled 2016-09-24: qty 1

## 2016-09-24 MED ORDER — PROPOFOL 500 MG/50ML IV EMUL
INTRAVENOUS | Status: DC | PRN
  Administered 2016-09-24: 100 ug/kg/min via INTRAVENOUS

## 2016-09-24 MED ORDER — PROMETHAZINE HCL 25 MG/ML IJ SOLN: 6.2500 mg | INTRAMUSCULAR | Status: DC | PRN

## 2016-09-24 MED ORDER — BUTAMBEN-TETRACAINE-BENZOCAINE 2-2-14 % EX AERO
INHALATION_SPRAY | CUTANEOUS | Status: DC | PRN
  Administered 2016-09-24: 1 via TOPICAL

## 2016-09-24 MED ORDER — DEXMEDETOMIDINE HCL IN NACL 80 MCG/20ML IV SOLN
INTRAVENOUS | Status: DC
  Filled 2016-09-24: qty 20

## 2016-09-24 MED ORDER — KETAMINE HCL 10 MG/ML IJ SOLN
INTRAMUSCULAR | Status: DC | PRN
  Administered 2016-09-24: 20 mg via INTRAVENOUS
  Administered 2016-09-24: 50 mg via INTRAVENOUS

## 2016-09-24 MED ORDER — EPHEDRINE 5 MG/ML INJ
INTRAVENOUS | Status: AC
  Filled 2016-09-24: qty 10

## 2016-09-24 MED ORDER — PROPOFOL 10 MG/ML IV BOLUS
INTRAVENOUS | Status: DC | PRN
  Administered 2016-09-24: 200 mg via INTRAVENOUS

## 2016-09-24 MED ORDER — ONDANSETRON HCL 4 MG/2ML IJ SOLN
INTRAMUSCULAR | Status: DC | PRN
  Administered 2016-09-24: 4 mg via INTRAVENOUS

## 2016-09-24 SURGICAL SUPPLY — 56 items
BANDAGE ACE 4X5 VEL STRL LF (GAUZE/BANDAGES/DRESSINGS) ×3 IMPLANT
BANDAGE ACE 6X5 VEL STRL LF (GAUZE/BANDAGES/DRESSINGS) ×3 IMPLANT
BANDAGE ESMARK 6X9 LF (GAUZE/BANDAGES/DRESSINGS) IMPLANT
BLADE SURG 10 STRL SS (BLADE) ×3 IMPLANT
BNDG CMPR 9X4 STRL LF SNTH (GAUZE/BANDAGES/DRESSINGS)
BNDG CMPR 9X6 STRL LF SNTH (GAUZE/BANDAGES/DRESSINGS)
BNDG COHESIVE 4X5 TAN STRL (GAUZE/BANDAGES/DRESSINGS) ×3 IMPLANT
BNDG ESMARK 4X9 LF (GAUZE/BANDAGES/DRESSINGS) IMPLANT
BNDG ESMARK 6X9 LF (GAUZE/BANDAGES/DRESSINGS)
BNDG GAUZE ELAST 4 BULKY (GAUZE/BANDAGES/DRESSINGS) ×3 IMPLANT
CONT SPEC 4OZ CLIKSEAL STRL BL (MISCELLANEOUS) ×2 IMPLANT
COVER SURGICAL LIGHT HANDLE (MISCELLANEOUS) ×3 IMPLANT
CUFF TOURN SGL LL 12 NO SLV (MISCELLANEOUS) IMPLANT
DRAPE SURG 17X23 STRL (DRAPES) IMPLANT
DRAPE U-SHAPE 47X51 STRL (DRAPES) IMPLANT
DRSG ADAPTIC 3X8 NADH LF (GAUZE/BANDAGES/DRESSINGS) ×2 IMPLANT
DRSG PAD ABDOMINAL 8X10 ST (GAUZE/BANDAGES/DRESSINGS) ×3 IMPLANT
DURAPREP 26ML APPLICATOR (WOUND CARE) ×3 IMPLANT
ELECT REM PT RETURN 9FT ADLT (ELECTROSURGICAL)
ELECTRODE REM PT RTRN 9FT ADLT (ELECTROSURGICAL) IMPLANT
EVACUATOR 1/8 PVC DRAIN (DRAIN) IMPLANT
FACESHIELD WRAPAROUND (MASK) IMPLANT
FACESHIELD WRAPAROUND OR TEAM (MASK) IMPLANT
GAUZE SPONGE 4X4 12PLY STRL (GAUZE/BANDAGES/DRESSINGS) ×3 IMPLANT
GAUZE SPONGE 4X4 12PLY STRL LF (GAUZE/BANDAGES/DRESSINGS) ×2 IMPLANT
GAUZE XEROFORM 1X8 LF (GAUZE/BANDAGES/DRESSINGS) ×1 IMPLANT
GLOVE BIO SURGEON STRL SZ7.5 (GLOVE) ×6 IMPLANT
GLOVE BIOGEL PI IND STRL 8 (GLOVE) ×2 IMPLANT
GLOVE BIOGEL PI INDICATOR 8 (GLOVE) ×4
GOWN STRL REUS W/ TWL LRG LVL3 (GOWN DISPOSABLE) ×3 IMPLANT
GOWN STRL REUS W/TWL LRG LVL3 (GOWN DISPOSABLE) ×9
HANDPIECE INTERPULSE COAX TIP (DISPOSABLE)
KIT BASIN OR (CUSTOM PROCEDURE TRAY) ×3 IMPLANT
KIT ROOM TURNOVER OR (KITS) ×3 IMPLANT
MANIFOLD NEPTUNE II (INSTRUMENTS) ×3 IMPLANT
NEEDLE 25GAX1.5 (MISCELLANEOUS) IMPLANT
NS IRRIG 1000ML POUR BTL (IV SOLUTION) ×3 IMPLANT
PACK ORTHO EXTREMITY (CUSTOM PROCEDURE TRAY) ×3 IMPLANT
PAD ABD 8X10 STRL (GAUZE/BANDAGES/DRESSINGS) ×2 IMPLANT
PAD ARMBOARD 7.5X6 YLW CONV (MISCELLANEOUS) ×6 IMPLANT
PADDING CAST ABS 4INX4YD NS (CAST SUPPLIES) ×2
PADDING CAST ABS COTTON 4X4 ST (CAST SUPPLIES) IMPLANT
SET HNDPC FAN SPRY TIP SCT (DISPOSABLE) IMPLANT
SPONGE LAP 18X18 X RAY DECT (DISPOSABLE) IMPLANT
STOCKINETTE IMPERVIOUS 9X36 MD (GAUZE/BANDAGES/DRESSINGS) ×3 IMPLANT
SUT ETHILON 3 0 PS 1 (SUTURE) ×4 IMPLANT
SUT PDS AB 2-0 CT1 27 (SUTURE) IMPLANT
SWAB CULTURE ESWAB REG 1ML (MISCELLANEOUS) IMPLANT
SYR CONTROL 10ML LL (SYRINGE) IMPLANT
TOWEL OR 17X24 6PK STRL BLUE (TOWEL DISPOSABLE) ×3 IMPLANT
TOWEL OR 17X26 10 PK STRL BLUE (TOWEL DISPOSABLE) ×3 IMPLANT
TUBE CONNECTING 12'X1/4 (SUCTIONS) ×1
TUBE CONNECTING 12X1/4 (SUCTIONS) ×2 IMPLANT
TUBING CYSTO DISP (UROLOGICAL SUPPLIES) ×2 IMPLANT
UNDERPAD 30X30 (UNDERPADS AND DIAPERS) ×3 IMPLANT
YANKAUER SUCT BULB TIP NO VENT (SUCTIONS) ×3 IMPLANT

## 2016-09-24 NOTE — CV Procedure (Signed)
   TRANSESOPHAGEAL ECHOCARDIOGRAM (TEE) NOTE  INDICATIONS: fungemia, bacteremia  PROCEDURE:   Informed consent was obtained prior to the procedure. The risks, benefits and alternatives for the procedure were discussed and the patient comprehended these risks.  Risks include, but are not limited to, cough, sore throat, vomiting, nausea, somnolence, esophageal and stomach trauma or perforation, bleeding, low blood pressure, aspiration, pneumonia, infection, trauma to the teeth and death.    After a procedural time-out, the patient was given Propofol for moderate sedation.  The patient's heart rate, blood pressure, and oxygen saturation are monitored continuously during the procedure.The oropharynx was anesthetized 1 cetacaine spray.  The transesophageal probe was inserted in the esophagus and stomach without difficulty and multiple views were obtained.  The patient was kept under observation until the patient left the procedure room. The patient left the procedure room in stable condition.   Agitated microbubble saline contrast was administered.  COMPLICATIONS:    There were no immediate complications.  Findings:  1. LEFT VENTRICLE: The left ventricular wall thickness is normal.  The left ventricular cavity is normal in size. Wall motion is normal.  LVEF is 55-60%.  2. RIGHT VENTRICLE:  The right ventricle is normal in structure and function without any thrombus or masses.    3. LEFT ATRIUM:  The left atrium is normal in size without any thrombus or masses.  There is not spontaneous echo contrast ("smoke") in the left atrium consistent with a low flow state.  4. LEFT ATRIAL APPENDAGE:  The left atrial appendage is free of any thrombus or masses. The appendage has single lobes. Pulse doppler indicates high flow in the appendage.  5. ATRIAL SEPTUM:  The atrial septum appears intact and is free of thrombus and/or masses.  There is no evidence for interatrial shunting by color doppler and  saline microbubble.  6. RIGHT ATRIUM:  The right atrium is normal in size and function without any thrombus or masses.  7. MITRAL VALVE:  The mitral valve is normal in structure and function with trivial regurgitation.  There were no vegetations or stenosis.  8. AORTIC VALVE:  The aortic valve is trileaflet, normal in structure and function with no regurgitation.  There were no vegetations or stenosis  9. TRICUSPID VALVE:  The tricuspid valve is normal in structure and function with no regurgitation.  There were no vegetations or stenosis  10.  PULMONIC VALVE:  The pulmonic valve is normal in structure and function with no regurgitation.  There were no vegetations or stenosis.   11. AORTIC ARCH, ASCENDING AND DESCENDING AORTA:  There was no Myrtis Ser(Katz et. Al, 1992) atherosclerosis of the ascending aorta, aortic arch, or proximal descending aorta.  12. PULMONARY VEINS: Anomalous pulmonary venous return was not noted.  13. PERICARDIUM: The pericardium appeared normal and non-thickened.  There is no pericardial effusion.  IMPRESSION:   1. No evidence for endocarditis. 2. Negative for PFO 3. No LAA thrombus 4. Normal LVEF 55-60%  RECOMMENDATIONS:    1.  Antibiotics/antifungals per primary service and ID recommendations.  Time Spent Directly with the Patient:  35 minutes   Chrystie NoseKenneth C. Jaysa Kise, MD, [facility] CenterFACC  [place]  [facility]  Attending Cardiologist  Direct Dial: [phone]  Fax: [phone]  Website:  [email or web address]  Dene Landsberg C Tillman Kazmierski 09/24/2016, 10:16 AM

## 2016-09-24 NOTE — Progress Notes (Signed)
   Subjective: Shannon Dawson is doing well over the interval. Still having significant shoulder pain. Denies fever/chills, N/V, abdominal pain, chest pain, soa, changes in vision, weakness/numbness, or persistent headaches. Discussed the plan for TEE, Right forearm I&D, and antibiotic selection. She is agreeable, with no questions or concerns.   Objective:  Vital signs in last 24 hours: Vitals:   09/23/16 2250 09/24/16 0627 09/24/16 0800 09/24/16 0858  BP:  113/65 135/75 128/74  Pulse:  (!) 53 (!) 51 (!) 56  Resp:  20 18 16   Temp:  98 F (36.7 C) 98.3 F (36.8 C) 98.7 F (37.1 C)  TempSrc:   Oral Oral  SpO2: 98% 98% 99% 99%  Weight:      Height:       Physical Exam  Constitutional: She is oriented to person, place, and time and well-developed, well-nourished, and in no distress.  HENT:  Head: Normocephalic and atraumatic.  Eyes: Conjunctivae are normal. Pupils are equal, round, and reactive to light.  Cardiovascular: Normal rate, regular rhythm, normal heart sounds and intact distal pulses.  Exam reveals no friction rub.   No murmur heard. Pulmonary/Chest: Effort normal and breath sounds normal. No respiratory distress. She has no wheezes. She has no rales. She exhibits no tenderness.  Abdominal: Soft. Bowel sounds are normal. She exhibits no distension. There is no tenderness. There is no rebound.  Musculoskeletal:  Erythema and swelling of the right patella improved over the interval, not extending beyond the demarcated region. Erythema and selling of the left dorso-lateral foot appears stable over the interval. Right forearm erythema and swelling decreased; however, increase number of fluctuating masses from 4 to 5 when compared to 09/23/2016. Left forearm I&D site has bandage with no notable surrounding erythema.   Neurological: She is alert and oriented to person, place, and time.  Psychiatric: Mood, memory, affect and judgment normal.   Assessment/Plan:  Principal Problem:  Fungemia Active Problems:   Cellulitis   Cellulitis of right arm   Cellulitis of right knee   Abscess of left forearm   Hypokalemia   Fall   Acute pain of right shoulder   Bacteremia   Abscess of forearm, right   Prepatellar bursitis, right knee  1. Fungemia and Bacteremia. 2/2 Strep pyogenes and Candida krusei  - ID consulted. Appreciate the recommendations; continue Vancomycin and Eraxis. Cefazolin D/C on 09/23/2016 - Ortho consulted. Appreciate the recommendations; will take for right forearm I&D today  - TEE to be done today; patient NPO - Opthalmology consulted for eye exam to rule out endophthalmitis. Awaiting recommendations   - Blood cultures on 09/22/2016 have shown NGTD  2. Asymptomatic Bacteriuria. Enterobacter aerogenes - Currently not treating   3. IV Drug Abuse. Heroin  - HIV and Hepatitis B negative/non-reactive  - Hepatitis C quant not detectable, prior positive reaction on 08/29/2011 - Expresses desire to quit. Outpatient methadone.   4. Right shoulder pain.  - Continue Tylenol PRN  - Toradol 30 mg IV q6hrs  - Oxycodone IR 5 mg q4hrs  - Changed Morphine 2 mg q4hr to Morphine 2 mg q3hrs  5. Right Prepatellar Bursitis. - Bedside ultrasound illustrated fluid with possible debris  - Pt declined aspiration today - Will discuss again tomorrow     Diet: NPO IVF: 14425mL/hr NS VTE ppx: Lovenox  Dispo: Unable to say how long patient may be here. Will wait until more data is collected.    Shannon Dawson, Shannon [place]oral, MD 09/24/2016, 9:01 AM Pager: My Pager: [phone]

## 2016-09-24 NOTE — Consult Note (Signed)
Shannon Dawson                                                                               09/24/2016                              Ophthalmology Consultation                                         Consult requested by: Dr. Josem KaufmannKlima  Reason for consultation:  Candidemia  HPI: Consult for eval of candidemia given systemic infection and I&D of cellulitis wounds  Pertinent Medical History:   Active Ambulatory Problems    Diagnosis Date Noted  . Opioid dependence (HCC) 08/29/2011  . Alcohol dependence (HCC) 08/29/2011  . OCD (obsessive compulsive disorder) 09/05/2011  . Bipolar disorder (HCC) 09/05/2011  . Generalized anxiety disorder 09/05/2011  . Nephrolithiasis 05/19/2015  . Ovarian cyst 11/21/2015  . Post-operative state 11/21/2015   Resolved Ambulatory Problems    Diagnosis Date Noted  . No Resolved Ambulatory Problems   Past Medical History:  Diagnosis Date  . Anemia   . Anxiety   . Chronic lower back pain   . Chronic pain of right knee   . Depression   . GAD (generalized anxiety disorder)   . Headache(784.0)   . History of alcohol abuse   . History of heroin abuse   . History of kidney stones   . Irregular menstrual cycle   . Nephrolithiasis   . OCD (obsessive compulsive disorder)   . Ovarian cyst   . Renal cyst, right   . Right ureteral stone   . SVD (spontaneous vaginal delivery)       Pertinent Ophthalmic History: None     Current Eye Medications:   Systemic medications on admission:   Medications Prior to Admission  Medication Sig Dispense Refill  . ibuprofen (ADVIL,MOTRIN) 200 MG tablet Take 200 mg by mouth every 6 (six) hours as needed for moderate pain.    Marland Kitchen. ibuprofen (ADVIL,MOTRIN) 600 MG tablet Take 1 tablet (600 mg total) by mouth every 6 (six) hours as needed. 30 tablet 0  . oxyCODONE-acetaminophen (PERCOCET/ROXICET) 5-325 MG tablet Take 1-2 tablets by mouth every 6 (six) hours as needed for severe pain. 6 tablet 0        ROS:  noncontributory  Visual Fields: FTC OU       Pupils:  Pharmacologically dilated at my direction before exam   Equal, brisk, no APD      Near acuity:   [place]   20/25    OD        [place]   20/25    OS       Dilation:  both eyes        [place]   External:   OD:  Normal       OS:  Normal       Anterior segment exam:  By penlight    Conjunctiva:  OD:  Quiet      OS:  Quiet  Cornea:    OD: Clear   OS: Clear  Anterior Chamber:   OD:  Deep/quiet      OS:  Deep/quiet     Iris:    OD:  Normal       OS:  Normal      Lens:    OD:  Clear         OS:  Clear          Optic disc:  OD:  Flat, sharp, pink, healthy      OS:  Flat, sharp, pink, healthy      Central retina--examined with indirect ophthalmoscope:  OD:  Macula and vessels normal; media clear      OS:  Macula and vessels normal; media clear      Peripheral retina--examined with indirect ophthalmoscope   OD:  Normal to ora 360 degrees    - no evidence of fungal balls or retinal/choroidal candidemia  OS:  Normal to ora 360 degrees    - no evidence of fungal balls or retinal/choroidal candidemia   Impression:  Candidemia without evidence of fungal balls or retinal/choroidal candidemia   Recommendations/Plan:  Continue current Abx regimen  I've discussed these findings with the nurse and/or resident. Please contact our office with any questions or concerns at [phone]   [facility]

## 2016-09-24 NOTE — Anesthesia Preprocedure Evaluation (Addendum)
Anesthesia Evaluation  Patient identified by MRN, date of birth, ID band Patient awake    Reviewed: Allergy & Precautions, NPO status , Patient's Chart, lab work & pertinent test results  Airway Mallampati: II  TM Distance: >3 FB Neck ROM: Full    Dental no notable dental hx.    Pulmonary Current Smoker,    Pulmonary exam normal breath sounds clear to auscultation       Cardiovascular negative cardio ROS Normal cardiovascular exam Rhythm:Regular Rate:Normal     Neuro/Psych Bipolar Disorder negative neurological ROS     GI/Hepatic negative GI ROS, Neg liver ROS,   Endo/Other  negative endocrine ROS  Renal/GU negative Renal ROS  negative genitourinary   Musculoskeletal negative musculoskeletal ROS (+)   Abdominal   Peds negative pediatric ROS (+)  Hematology negative hematology ROS (+)   Anesthesia Other Findings   Reproductive/Obstetrics negative OB ROS                            Anesthesia Physical Anesthesia Plan  ASA: II  Anesthesia Plan: General   Post-op Pain Management:    Induction: Intravenous  PONV Risk Score and Plan: 1 and Ondansetron, Dexamethasone and Treatment may vary due to age or medical condition  Airway Management Planned: LMA  Additional Equipment:   Intra-op Plan:   Post-operative Plan:   Informed Consent: I have reviewed the patients History and Physical, chart, labs and discussed the procedure including the risks, benefits and alternatives for the proposed anesthesia with the patient or authorized representative who has indicated his/her understanding and acceptance.   Dental advisory given  Plan Discussed with: CRNA and Surgeon  Anesthesia Plan Comments:         Anesthesia Quick Evaluation

## 2016-09-24 NOTE — Anesthesia Procedure Notes (Signed)
Procedure Name: LMA Insertion Date/Time: 09/24/2016 1:04 PM Performed by: Rosiland OzMEYERS, Namiko Pritts Pre-anesthesia Checklist: Patient identified, Emergency Drugs available, Timeout performed, Patient being monitored and Suction available Patient Re-evaluated:Patient Re-evaluated prior to inductionOxygen Delivery Method: Circle system utilized Preoxygenation: Pre-oxygenation with 100% oxygen Intubation Type: IV induction Ventilation: Mask ventilation without difficulty LMA: LMA inserted LMA Size: 4.0 Number of attempts: 1 Placement Confirmation: positive ETCO2 and breath sounds checked- equal and bilateral Tube secured with: Tape Dental Injury: Teeth and Oropharynx as per pre-operative assessment

## 2016-09-24 NOTE — Anesthesia Procedure Notes (Signed)
Procedure Name: MAC Date/Time: 09/24/2016 9:50 AM Performed by: Mervyn Gay Pre-anesthesia Checklist: Patient identified, Patient being monitored, Timeout performed, Emergency Drugs available and Suction available Patient Re-evaluated:Patient Re-evaluated prior to inductionOxygen Delivery Method: Nasal cannula Number of attempts: 1 Placement Confirmation: positive ETCO2 Dental Injury: Teeth and Oropharynx as per pre-operative assessment

## 2016-09-24 NOTE — H&P (Signed)
   INTERVAL PROCEDURE H&P  History and Physical Interval Note:  09/24/2016 9:33 AM  Shannon Dawson has presented today for their planned procedure. The various methods of treatment have been discussed with the patient and family. After consideration of risks, benefits and other options for treatment, the patient has consented to the procedure.  The patients' outpatient history has been reviewed, patient examined, and no change in status from most recent office note within the past 30 days. I have reviewed the patients' chart and labs and will proceed as planned. Questions were answered to the patient's satisfaction.   Shannon NoseKenneth C. Hilty, MD, Capital Medical CenterFACC  [place]  [facility]  Attending Cardiologist  Direct Dial: [phone]  Fax: [phone]  Website:  [email or web address]  Shannon Dawson 09/24/2016, 9:33 AM

## 2016-09-24 NOTE — Op Note (Signed)
09/20/2016 - 09/24/2016  2:47 PM  PATIENT:  Shannon Dawson    PRE-OPERATIVE DIAGNOSIS:  RIGHT ARM ABCESS  POST-OPERATIVE DIAGNOSIS:  Same  PROCEDURE:  INCISION AND DRAINAGE ABSCESS RIGHT ARM  SURGEON:  Solomon Skowronek D, MD  ASSISTANT: Aquilla HackerHenry Martensen, PA-C, he was present and scrubbed throughout the case, critical for completion in a timely fashion, and for retraction, instrumentation, and closure.   ANESTHESIA:   gen  PREOPERATIVE INDICATIONS:  Shannon Dawson is a  41 y.o. female with a diagnosis of RIGHT ARM ABCESS who failed conservative measures and elected for surgical management.    The risks benefits and alternatives were discussed with the patient preoperatively including but not limited to the risks of infection, bleeding, nerve injury, cardiopulmonary complications, the need for revision surgery, among others, and the patient was willing to proceed.  OPERATIVE IMPLANTS: none  OPERATIVE FINDINGS: purulent fluid at 4 sites  BLOOD LOSS: 50  COMPLICATIONS: none  TOURNIQUET TIME: 20min  OPERATIVE PROCEDURE:  Patient was identified in the preoperative holding area and site was marked by me She was transported to the operating theater and placed on the table in supine position taking care to pad all bony prominences. After a preincinduction time out anesthesia was induced. The right upper extremity was prepped and draped in normal sterile fashion and a pre-incision timeout was performed. She received vanc for preoperative antibiotics.   She had 4 major areas of purulence. I made an incision at each spot 2 were roughly 4 cm 2 of 8 cm. Immediately express purulent fluid from each of these and performed a debridement of necrotic tissue using scissors this was an excisional debridement.  I did perform a volar fasciotomy to help I would room for swelling.  I protect all neurovascular structures.  At her distal wound I performed a Tina lysis of her for extensor tendons visible here.  There are other 4 sites were additional extensor tendons are performed for additional Tina lysis here of ECRL ECRB.  I then performed a complex closure of each incision sites 4 cm 4 cm 8 cm 8 cm  Sterile dressings were applied she was awoken and taken the PACU in stable condition  POST OPERATIVE PLAN: Continue care per primary team with antibiotics mobilized for DVT prophylaxis

## 2016-09-24 NOTE — Interval H&P Note (Signed)
History and Physical Interval Note:  09/24/2016 11:51 AM  [name]  has presented today for surgery, with the diagnosis of RIGHT ARM ABCESS  The various methods of treatment have been discussed with the patient and family. After consideration of risks, benefits and other options for treatment, the patient has consented to  Procedure(s): INCISION AND DRAINAGE ABSCESS RIGHT ARM (Right) as a surgical intervention .  The patient's history has been reviewed, patient examined, no change in status, stable for surgery.  I have reviewed the patient's chart and labs.  Questions were answered to the patient's satisfaction.     Nisaiah Bechtol D

## 2016-09-24 NOTE — Anesthesia Preprocedure Evaluation (Signed)
Anesthesia Evaluation  Patient identified by MRN, date of birth, ID band Patient awake    Reviewed: Allergy & Precautions, NPO status , Patient's Chart, lab work & pertinent test results  History of Anesthesia Complications Negative for: history of anesthetic complications  Airway Mallampati: II  TM Distance: >3 FB Neck ROM: Full    Dental  (+) Edentulous Upper, Poor Dentition   Pulmonary Current Smoker,    breath sounds clear to auscultation       Cardiovascular  Rhythm:Regular     Neuro/Psych  Headaches, PSYCHIATRIC DISORDERS Anxiety Depression Bipolar Disorder    GI/Hepatic (+)       cocaine use and IV drug use,   Endo/Other    Renal/GU Renal disease     Musculoskeletal  (+) Arthritis ,   Abdominal   Peds  Hematology  (+) anemia ,   Anesthesia Other Findings   Reproductive/Obstetrics                             Anesthesia Physical Anesthesia Plan  ASA: III  Anesthesia Plan: MAC   Post-op Pain Management:    Induction: Intravenous  PONV Risk Score and Plan: 1 and Ondansetron  Airway Management Planned: Natural Airway, Nasal Cannula and Simple Face Mask  Additional Equipment: None  Intra-op Plan:   Post-operative Plan:   Informed Consent: I have reviewed the patients History and Physical, chart, labs and discussed the procedure including the risks, benefits and alternatives for the proposed anesthesia with the patient or authorized representative who has indicated his/her understanding and acceptance.   Dental advisory given  Plan Discussed with: CRNA and Surgeon  Anesthesia Plan Comments:         Anesthesia Quick Evaluation

## 2016-09-24 NOTE — H&P (View-Only) (Signed)
Reason for Consult:Shannon Dawson: L [name] is an 41 y.o. female.  HPI: Shannon Dawson has a history of heroin abuse. She has been using regularly up until about a week ago. She generally injects her hands equally. She is RHD. She fell on 7/6, taking the brunt of the force on her Shannon. She also tumbled and somehow hurt her shoulder in the process as well. She came to the ED where x-rays were normal. However, she was noted to have some upper extremity abscesses and cellulitis in addition to the red Shannon and was admitted by the medical service. Since admission she says her Shannon has gotten much better. The forearm has gotten a little worse and she's developed some cellulitis in her left foot as well. She has been diagnosed with bacteremia/fungemia and ID is involved.  Past Medical History:  Diagnosis Date  . Anemia    hx  . Anxiety   . Chronic lower back pain   . Chronic pain of right Shannon    post Shannon injury 2011, pins and wires, rod  . Depression    hx  . GAD (generalized anxiety disorder)   . Headache(784.0)    otc med prn  . History of alcohol abuse    pt verbalized by pt no alcohol since 2011  . History of heroin abuse    per epic documentation and pt detoxed 2011--  verbalized by pt clean since 2011  . History of kidney stones   . Irregular menstrual cycle   . Nephrolithiasis    bilateral non-obstructive per renal u/s 05-27-2015  . OCD (obsessive compulsive disorder)   . Ovarian cyst    BILATERAL  . Renal cyst, right   . Right ureteral stone   . SVD (spontaneous vaginal delivery)    x 2    Past Surgical History:  Procedure Laterality Date  . CYSTO/  RIGHT URETEROSCOPIC LASER LITHOTRIPSY STONE EXTRACTION  03-14-2008  . CYSTOSCOPY WITH RETROGRADE PYELOGRAM, URETEROSCOPY AND STENT PLACEMENT Right 05/19/2015   Procedure: CYSTOSCOPY WITH RETROGRADE PYELOGRAM, URETEROSCOPY AND STENT PLACEMENT;  Surgeon: Cleon Gustin, MD;  Location: [facility];   Service: Urology;  Laterality: Right;  . CYSTOSCOPY WITH RETROGRADE PYELOGRAM, URETEROSCOPY AND STENT PLACEMENT Right 06/12/2015   Procedure: CYSTOSCOPY WITH RETROGRADE PYELOGRAM, URETEROSCOPY, STONE EXTRACTION AND STENT EXCHANGE;  Surgeon: Cleon Gustin, MD;  Location: [facility];  Service: Urology;  Laterality: Right;  . HOLMIUM LASER APPLICATION Right 09/04/5091   Procedure: HOLMIUM LASER APPLICATION;  Surgeon: Cleon Gustin, MD;  Location: [facility];  Service: Urology;  Laterality: Right;  . I & D OPEN RIGHT Shannon TIBIA/ FIBULA FX'S/  IM NAILING TIBIA/  CLOSURE TRAUMATIC WOUNDS  07-18-2009  . LAPAROSCOPIC CHOLECYSTECTOMY  05-13-2009  . LAPAROTOMY Right 11/21/2015   Procedure: LAPAROTOMY, RIght Salpingo Oophrectomy, Right Ovarian Cystectomy;  Surgeon: Lavonia Drafts, MD;  Location: [facility];  Service: Gynecology;  Laterality: Right;  . MULTIPLE TOOTH EXTRACTIONS     cracked and chipped lower teeth, no upper teeth  . ORIF RIGHT PATELLA FX  2013  . WISDOM TOOTH EXTRACTION      History reviewed. No pertinent family history.  Social History:  reports that she has been smoking Cigarettes.  She has a 25.00 pack-year smoking history. She has never used smokeless tobacco. She reports that she uses drugs, including Heroin, Cocaine, Marijuana, and IV, about 7 times per week. She reports that she does not drink alcohol.  Allergies:  Allergies  Allergen Reactions  . Tramadol Hives and Itching    Severe  . Vicodin [Hydrocodone-Acetaminophen] Itching    Itching is severe.     Medications: I have reviewed the patient's current medications.  Results for orders placed or performed during the hospital encounter of 09/20/16 (from the past 48 hour(s))  Basic metabolic panel     Status: Abnormal   Collection Time: 09/21/16  6:14 PM  Result Value Ref Range   Sodium 134 (L) 135 - 145 mmol/L   Potassium 3.8 3.5 - 5.1 mmol/L    Comment: SLIGHT HEMOLYSIS    Chloride 103 101 - 111 mmol/L   CO2 19 (L) 22 - 32 mmol/L   Glucose, Bld 122 (H) 65 - 99 mg/dL   BUN 9 6 - 20 mg/dL   Creatinine, Ser 0.75 0.44 - 1.00 mg/dL   Calcium 8.7 (L) 8.9 - 10.3 mg/dL   GFR calc non Af Amer >60 >60 mL/min   GFR calc Af Amer >60 >60 mL/min    Comment: (NOTE) The eGFR has been calculated using the CKD EPI equation. This calculation has not been validated in all clinical situations. eGFR's persistently <60 mL/min signify possible Chronic Kidney Disease.    Anion gap 12 5 - 15  Comprehensive metabolic panel     Status: Abnormal   Collection Time: 09/22/16  5:44 AM  Result Value Ref Range   Sodium 136 135 - 145 mmol/L   Potassium 5.2 (H) 3.5 - 5.1 mmol/L    Comment: HEMOLYSIS AT THIS LEVEL MAY AFFECT RESULT   Chloride 107 101 - 111 mmol/L   CO2 19 (L) 22 - 32 mmol/L   Glucose, Bld 91 65 - 99 mg/dL   BUN 11 6 - 20 mg/dL   Creatinine, Ser 0.77 0.44 - 1.00 mg/dL   Calcium 8.8 (L) 8.9 - 10.3 mg/dL   Total Protein 6.2 (L) 6.5 - 8.1 g/dL   Albumin 1.9 (L) 3.5 - 5.0 g/dL   AST QUANTITY NOT SUFFICIENT, UNABLE TO PERFORM TEST 15 - 41 U/L   ALT QUANTITY NOT SUFFICIENT, UNABLE TO PERFORM TEST 14 - 54 U/L   Alkaline Phosphatase QUANTITY NOT SUFFICIENT, UNABLE TO PERFORM TEST 38 - 126 U/L   Total Bilirubin QUANTITY NOT SUFFICIENT, UNABLE TO PERFORM TEST 0.3 - 1.2 mg/dL   GFR calc non Af Amer >60 >60 mL/min   GFR calc Af Amer >60 >60 mL/min    Comment: (NOTE) The eGFR has been calculated using the CKD EPI equation. This calculation has not been validated in all clinical situations. eGFR's persistently <60 mL/min signify possible Chronic Kidney Disease.    Anion gap 10 5 - 15  CBC     Status: Abnormal   Collection Time: 09/22/16  7:34 AM  Result Value Ref Range   WBC 8.3 4.0 - 10.5 K/uL   RBC 3.54 (L) 3.87 - 5.11 MIL/uL   Hemoglobin 10.4 (L) 12.0 - 15.0 g/dL   HCT 31.0 (L) 36.0 - 46.0 %   MCV 87.6 78.0 - 100.0 fL   MCH 29.4 26.0 - 34.0 pg   MCHC 33.5 30.0 -  36.0 g/dL   RDW 17.5 (H) 11.5 - 15.5 %   Platelets 317 150 - 400 K/uL  Basic metabolic panel     Status: Abnormal   Collection Time: 09/23/16  5:58 AM  Result Value Ref Range   Sodium 141 135 - 145 mmol/L   Potassium 3.2 (L) 3.5 - 5.1 mmol/L   Chloride  109 101 - 111 mmol/L   CO2 24 22 - 32 mmol/L   Glucose, Bld 95 65 - 99 mg/dL   BUN 7 6 - 20 mg/dL   Creatinine, Ser 0.69 0.44 - 1.00 mg/dL   Calcium 8.2 (L) 8.9 - 10.3 mg/dL   GFR calc non Af Amer >60 >60 mL/min   GFR calc Af Amer >60 >60 mL/min    Comment: (NOTE) The eGFR has been calculated using the CKD EPI equation. This calculation has not been validated in all clinical situations. eGFR's persistently <60 mL/min signify possible Chronic Kidney Disease.    Anion gap 8 5 - 15    No results found.  Review of Systems  Constitutional: Negative for weight loss.  HENT: Negative for ear discharge, ear pain, hearing loss and tinnitus.   Eyes: Negative for blurred vision, double vision, photophobia and pain.  Respiratory: Negative for cough, sputum production and shortness of breath.   Cardiovascular: Negative for chest pain.  Gastrointestinal: Negative for abdominal pain, nausea and vomiting.  Genitourinary: Negative for dysuria, flank pain, frequency and urgency.  Musculoskeletal: Positive for joint pain (Right shoulder, Shannon) and myalgias (Right forearm). Negative for back pain, falls and neck pain.  Neurological: Positive for tingling (BLE). Negative for dizziness, sensory change, focal weakness, loss of consciousness and headaches.  Endo/Heme/Allergies: Does not bruise/bleed easily.  Psychiatric/Behavioral: Negative for depression, memory loss and substance abuse. The patient is not nervous/anxious.    Blood pressure 108/66, pulse (!) 59, temperature 98.4 F (36.9 C), temperature source Oral, resp. rate 18, height _0  (1.6 m), weight 65.8 kg (145 lb), last menstrual period 09/18/2016, SpO2 100 %. Physical Exam   Constitutional: She appears well-developed and well-nourished. No distress.  HENT:  Head: Normocephalic.  Eyes: Conjunctivae are normal. Right eye exhibits no discharge. Left eye exhibits no discharge. No scleral icterus.  Cardiovascular: Normal rate and regular rhythm.   Respiratory: Effort normal. No respiratory distress.  Musculoskeletal:  Right shoulder, elbow, wrist, digits- mod TTP along clav and AC joint, minimal TTP ant/lat shoulder. Volar forearm with confluent erythema and induration, mild TTP, no definitive fluctuance  Sens  Ax/R/M/U intact  Mot   Ax/ R/ PIN/ M/ AIN/ U intact  Rad 2+  Left shoulder, elbow, wrist, digits- small would volar wrist macerated, nontender, no instability, no blocks to motion  Sens  Ax/R/M/U intact  Mot   Ax/ R/ PIN/ M/ AIN/ U intact  Rad 2+  RLE No traumatic wounds, ecchymosis, or rash  Ant Shannon TTP, erythematous, mild warmth, swelling  No effusions  Sens DPN, SPN, TN intact  Motor EHL, ext, flex, evers 5/5  DP 2+, PT 2+, No significant edema   LLE No traumatic wounds, ecchymosis, or rash  Nontender  No effusions  Shannon stable to varus/ valgus and anterior/posterior stress  Sens DPN paresthetic, SPN, TN intact  Motor EHL, ext, flex, evers 5/5  DP 2+, PT 2+, 2+ NP edema, erythema over mid/forefoot  Neurological: She is alert.  Skin: Skin is warm and dry. She is not diaphoretic.  Psychiatric: She has a normal mood and affect. Her behavior is normal.    Assessment/Plan: Fall Right shoulder pain -- X-rays reviewed, no abnormalities. Suspect contusion vs mild AC injury vs occult clav fx (unlikely). WBAT, ROM as tolerated. Can use sling for comfort. Right forearm cellulitis -- Will get [place] to see if there's any drainable collection, only really suspicious about one of the areas though there could be some deep. Right  Shannon pain -- Traumatic vs septic bursitis most likely. As this is improving would not recommend any intervention at this point. May  need aspiration if it fails to improve completely. Should it worsen would be concerned about septic joint; please call if this occurs. She notes no problems with pain or function of Shannon prior to this fall so do not feel hardware removal indicated. Left foot cellulitis -- Monitor for progression  Heroin abuse [place], PA-C Orthopedic Surgery [phone] 09/23/2016, 10:50 AM

## 2016-09-24 NOTE — Anesthesia Postprocedure Evaluation (Signed)
Anesthesia Post Note  Patient: Shannon Dawson  Procedure(s) Performed: Procedure(s) (LRB): INCISION AND DRAINAGE ABSCESS RIGHT ARM (Right)     Patient location during evaluation: PACU Anesthesia Type: General Level of consciousness: awake and alert Pain management: pain level controlled Vital Signs Assessment: post-procedure vital signs reviewed and stable Respiratory status: spontaneous breathing, nonlabored ventilation, respiratory function stable and patient connected to nasal cannula oxygen Cardiovascular status: blood pressure returned to baseline and stable Postop Assessment: no signs of nausea or vomiting Anesthetic complications: no    Last Vitals:  Vitals:   09/24/16 1448 09/24/16 1556  BP: 119/72 105/78  Pulse: 62 70  Resp:  17  Temp: 36.5 C (!) 36.3 C    Last Pain:  Vitals:   09/24/16 1556  TempSrc: Axillary  PainSc:                  Bayron Dalto S

## 2016-09-24 NOTE — Transfer of Care (Signed)
Immediate Anesthesia Transfer of Care Note  Patient: Shannon SnareMary Dawson  Procedure(s) Performed: Procedure(s): INCISION AND DRAINAGE ABSCESS RIGHT ARM (Right)  Patient Location: PACU  Anesthesia Type:General  Level of Consciousness: awake  Airway & Oxygen Therapy: Patient Spontanous Breathing  Post-op Assessment: Report given to RN and Post -op Vital signs reviewed and stable  Post vital signs: Reviewed and stable  Last Vitals:  Vitals:   09/24/16 1035 09/24/16 1100  BP: (!) 145/86 124/71  Pulse: (!) 51 (!) 51  Resp: 14 16  Temp:  36.8 C    Last Pain:  Vitals:   09/24/16 1130  TempSrc:   PainSc: 7       Patients Stated Pain Goal: 3 (09/24/16 1130)  Complications: No apparent anesthesia complications

## 2016-09-24 NOTE — Transfer of Care (Signed)
Immediate Anesthesia Transfer of Care Note  Patient: Grier Rocher  Procedure(s) Performed: Procedure(s): TRANSESOPHAGEAL ECHOCARDIOGRAM (TEE) (N/A)  Patient Location: Endoscopy Unit  Anesthesia Type:MAC  Level of Consciousness: drowsy and patient cooperative  Airway & Oxygen Therapy: Patient Spontanous Breathing and Patient connected to nasal cannula oxygen  Post-op Assessment: Report given to RN, Post -op Vital signs reviewed and stable and Patient moving all extremities X 4  Post vital signs: Reviewed and stable  Last Vitals:  Vitals:   09/24/16 0858 09/24/16 0922  BP: 128/74 (!) 151/92  Pulse: (!) 56 61  Resp: 16 12  Temp: 37.1 C 37.2 C    Last Pain:  Vitals:   09/24/16 0922  TempSrc: Oral  PainSc: 9       Patients Stated Pain Goal: 4 (14/97/02 6378)  Complications: No apparent anesthesia complications

## 2016-09-24 NOTE — Progress Notes (Signed)
Patient ID: Servando SnareMary Raider, female   DOB: 01/02/1976, 41 y.o.   MRN: [id number]          [facility]    Date of Admission:  09/20/2016   Day 5 antibiotics         Ms. Ewell PoeKestler is currently in the OR for drainage of her right forearm abscesses. She has grown group A strep and MRSA from her left forearm abscess. She has group A strep and Candida krusei in her blood cultures. She also has right prepatellar bursitis and refused aspiration/drainage this morning. Repeat blood cultures are negative so far. TEE did not reveal any evidence of endocarditis. He has no other pathogens are isolated I will consider giving her a dose of long-acting IV oritavancin before discharge. This will cover group A strep and MRSA and treat her cellulitis and abscesses. I will also see if we can get her a supply of oral voriconazole. If repeat blood cultures remain negative she will need another 2 weeks of therapy for candidemia.         Cliffton AstersJohn Muscab Brenneman, MD [facility] [phone] pager   [phone] cell 09/24/2016, 1:58 PM

## 2016-09-25 ENCOUNTER — Encounter (HOSPITAL_COMMUNITY): Payer: Self-pay | Admitting: Orthopedic Surgery

## 2016-09-25 DIAGNOSIS — Y939 Activity, unspecified: Secondary | ICD-10-CM

## 2016-09-25 DIAGNOSIS — Z9889 Other specified postprocedural states: Secondary | ICD-10-CM

## 2016-09-25 DIAGNOSIS — R8271 Bacteriuria: Secondary | ICD-10-CM

## 2016-09-25 DIAGNOSIS — F1910 Other psychoactive substance abuse, uncomplicated: Secondary | ICD-10-CM

## 2016-09-25 LAB — BASIC METABOLIC PANEL
ANION GAP: 6 (ref 5–15)
BUN: 5 mg/dL — AB (ref 6–20)
CO2: 27 mmol/L (ref 22–32)
Calcium: 8.2 mg/dL — ABNORMAL LOW (ref 8.9–10.3)
Chloride: 108 mmol/L (ref 101–111)
Creatinine, Ser: 0.63 mg/dL (ref 0.44–1.00)
GFR calc Af Amer: >60 mL/min (ref >60)
GFR calc non Af Amer: >60 mL/min (ref >60)
GLUCOSE: 108 mg/dL — AB (ref 65–99)
POTASSIUM: 3.2 mmol/L — AB (ref 3.5–5.1)
Sodium: 141 mmol/L (ref 135–145)

## 2016-09-25 LAB — VANCOMYCIN, TROUGH: Vancomycin Tr: 15 ug/mL (ref 15–20)

## 2016-09-25 LAB — CBC
HEMATOCRIT: 30.9 % — AB (ref 36.0–46.0)
Hemoglobin: 9.7 g/dL — ABNORMAL LOW (ref 12.0–15.0)
MCH: 28.4 pg (ref 26.0–34.0)
MCHC: 31.4 g/dL (ref 30.0–36.0)
MCV: 90.6 fL (ref 78.0–100.0)
Platelets: 475 10*3/uL — ABNORMAL HIGH (ref 150–400)
RBC: 3.41 MIL/uL — AB (ref 3.87–5.11)
RDW: 18.2 % — ABNORMAL HIGH (ref 11.5–15.5)
WBC: 7.3 10*3/uL (ref 4.0–10.5)

## 2016-09-25 MED ORDER — VORICONAZOLE 200 MG PO TABS: 200.0000 mg | ORAL_TABLET | Freq: Two times a day (BID) | ORAL | Status: DC

## 2016-09-25 MED ORDER — SODIUM CHLORIDE 0.9 % IV SOLN
100.0000 mg | Freq: Once | INTRAVENOUS | Status: AC
  Administered 2016-09-26: 100 mg via INTRAVENOUS
  Filled 2016-09-25: qty 100

## 2016-09-25 MED ORDER — ORITAVANCIN DIPHOSPHATE 400 MG IV SOLR
1200.0000 mg | Freq: Once | INTRAVENOUS | Status: AC
  Administered 2016-09-25: 1200 mg via INTRAVENOUS
  Filled 2016-09-25: qty 120

## 2016-09-25 MED ORDER — VORICONAZOLE 200 MG PO TABS
200.0000 mg | ORAL_TABLET | Freq: Two times a day (BID) | ORAL | Status: DC
  Administered 2016-09-27 – 2016-09-29 (×5): 200 mg via ORAL
  Filled 2016-09-25 (×5): qty 1

## 2016-09-25 NOTE — Progress Notes (Signed)
   Assessment / Plan: 1 Day Post-Op  S/P Procedure(s) (LRB): INCISION AND DRAINAGE ABSCESS RIGHT ARM (Right) by Dr. Jewel Baizeimothy D. Murphy on 09/24/16  Principal Problem:   Fungemia Active Problems:   Cellulitis   Cellulitis of right arm   Cellulitis of right knee   Abscess of left forearm   Hypokalemia   Fall   Acute pain of right shoulder   Bacteremia   Abscess of forearm, right   Prepatellar bursitis, right knee   Status post I/D right forearm abscess Doing well POD1.  Soreness in forearm significantly improved.   Continue antibiotic therapy per ID. Dressing change 09/27/2016. Remove sutures in about 2 weeks. Incentive Spirometry Elevate and apply ice  Right prepatellar bursitis History of ORIF/cerclage remotely. Recent fall onto knee. Erythema, swelling, and discomfort significantly improving. Compression, ice, limit trauma to the area and follow for signs of infection.  Weight Bearing: Weight Bearing as Tolerated (WBAT) RUE Dressings: Change on 09/27/16. Dispo: per primary  Will follow along at a distance.  Please call with questions or if right prepatellar bursitis shows signs of infection.  Subjective: Patient reports pain as mild. Right Shoulder soreness also improving.    Objective:   VITALS:   Vitals:   09/24/16 1648 09/24/16 1747 09/24/16 2234 09/25/16 0701  BP: 111/68 107/68 110/66 117/69  Pulse: (!) 59 (!) 59 63 (!) 55  Resp: 17 16 18 18   Temp: 98.2 F (36.8 C) 97.7 F (36.5 C)  98.6 F (37 C)  TempSrc: Oral Oral    SpO2: 99% 95% 93% 97%  Weight:      Height:       CBC Latest Ref Rng & Units 09/25/2016 09/24/2016 09/22/2016  WBC 4.0 - 10.5 K/uL 7.3 8.6 8.3  Hemoglobin 12.0 - 15.0 g/dL 6.0(A9.7(L) 5.4(U9.4(L) 10.4(L)  Hematocrit 36.0 - 46.0 % 30.9(L) 29.4(L) 31.0(L)  Platelets 150 - 400 K/uL 475(H) 438(H) 317   BMP Latest Ref Rng & Units 09/25/2016 09/24/2016 09/23/2016  Glucose 65 - 99 mg/dL 981(X108(H) 91 95  BUN 6 - 20 mg/dL 5(L) <9(J<5(L) 7  Creatinine 0.44 - 1.00  mg/dL 4.780.63 2.950.62 6.210.69  Sodium 135 - 145 mmol/L 141 141 141  Potassium 3.5 - 5.1 mmol/L 3.2(L) 3.3(L) 3.2(L)  Chloride 101 - 111 mmol/L 108 110 109  CO2 22 - 32 mmol/L 27 23 24   Calcium 8.9 - 10.3 mg/dL 8.2(L) 8.1(L) 8.2(L)   Intake/Output      07/10 0701 - 07/11 0700 07/11 0701 - 07/12 0700   P.O. 300    I.V. (mL/kg) 700 (10.3)    Total Intake(mL/kg) 1000 (14.7)    Urine (mL/kg/hr) 350 (0.2)    Blood 25 (0)    Total Output 375     Net +625          Urine Occurrence 1 x      Physical Exam: General: NAD.  Upright in bed. Calm, conversant. No increased work of breathing. MSK Right upper extremity: Neurovascularly intact Sensation intact distally Hand warm with moderate expected swelling. Incision: dressing C/D/I   Shannon BilletHenry Calvin Martensen III, PA-C 09/25/2016, 8:23 AM

## 2016-09-25 NOTE — Progress Notes (Signed)
CM received consult: MATCH program. Will need voriconazole at discharge please. Thanks! CM spoke with MD regarding specifics(dosage, frequency,) needed to determine if Match program can be offered. MD to speak with ID MD and f/u with CM  sp regarding medication specifics. Gae Gallopngela Amoni Scallan RN,BSN,CM [phone]

## 2016-09-25 NOTE — Progress Notes (Signed)
Patient ID: Shannon Dawson, female   DOB: 09-26-1975, 41 y.o.   MRN: [id number]          [facility]  Date of Admission:  09/20/2016           Day 5 anidulafungin        Day 4 vancomycin ASSESSMENT: She has disseminated infection with cellulitis, soft tissue abscesses probable right prepatellar bursitis. bacteremia and fungemia. Her blood cultures on 09/22/2016 were negative. She needs 2 weeks of therapy for candidemia starting on that date. Her social worker is trying to get her access to oral voriconazole through the match program. In order to treat her group A strep and MRSA infections we will complete therapy by giving her 1 dose of long-acting IV oritavancin before discharge.  PLAN: 1. Change anidulafungin to oral voriconazole and treat for 11 more days through 10/06/2016 2. Administer 1 dose of IV oritavancin before discharge  Principal Problem:   Fungemia Active Problems:   Cellulitis   Cellulitis of right arm   Cellulitis of right knee   Abscess of left forearm   Hypokalemia   Fall   Acute pain of right shoulder   Bacteremia   Abscess of forearm, right   Prepatellar bursitis, right knee   . docusate sodium  100 mg Oral BID  . heparin subcutaneous  5,000 Units Subcutaneous Q8H  . nicotine  14 mg Transdermal Daily  . senna  1 tablet Oral BID  . sodium chloride flush  3 mL Intravenous Q12H    SUBJECTIVE: She is still having some pain in her arms and right knee but is feeling much better. She is homeless. She has been staying with a variety of different friends recently. She is not sure where she will go upon discharge. She has been in drug treatment in the past and would like to get back on methadone. She refused aspiration of her right prepatellar bursa again this morning.  Review of Systems: Review of Systems  Constitutional: Negative for chills, diaphoresis and fever.  Respiratory: Negative for cough, sputum production and shortness of  breath.   Cardiovascular: Negative for chest pain.  Gastrointestinal: Negative for abdominal pain, diarrhea, nausea and vomiting.  Genitourinary: Negative for dysuria.  Musculoskeletal: Positive for back pain and joint pain.  Psychiatric/Behavioral: Positive for substance abuse.    Allergies  Allergen Reactions  . Tramadol Hives and Itching    Severe  . Vicodin [Hydrocodone-Acetaminophen] Itching    Itching is severe.     OBJECTIVE: Vitals:   09/24/16 1747 09/24/16 2234 09/25/16 0701 09/25/16 1027  BP: 107/68 110/66 117/69 121/71  Pulse: (!) 59 63 (!) 55 61  Resp: 16 18 18 16   Temp: 97.7 F (36.5 C)  98.6 F (37 C) 98.4 F (36.9 C)  TempSrc: Oral     SpO2: 95% 93% 97% 98%  Weight:      Height:       Body mass index is 26.57 kg/m.  Physical Exam  Constitutional: She is oriented to person, place, and time.  She is in better spirits today.  HENT:  Mouth/Throat: No oropharyngeal exudate.  Cardiovascular: Normal rate and regular rhythm.   No murmur heard. Pulmonary/Chest: Effort normal. She has no wheezes. She has no rales.  Abdominal: Soft. She exhibits no distension.  Musculoskeletal:  She has a bandage over her left forearm abscess site. Her right arm is dressed after drainage of 4 abscesses yesterday. Her prepatellar bursa on the right remains fluctuant  but the redness has diminished.  Neurological: She is alert and oriented to person, place, and time.  Skin: No rash noted.  Psychiatric: Mood and affect normal.    Lab Results Lab Results  Component Value Date   WBC 7.3 09/25/2016   HGB 9.7 (L) 09/25/2016   HCT 30.9 (L) 09/25/2016   MCV 90.6 09/25/2016   PLT 475 (H) 09/25/2016    Lab Results  Component Value Date   CREATININE 0.63 09/25/2016   BUN 5 (L) 09/25/2016   NA 141 09/25/2016   K 3.2 (L) 09/25/2016   CL 108 09/25/2016   CO2 27 09/25/2016    Lab Results  Component Value Date   ALT 10 (L) 09/24/2016   AST 17 09/24/2016   ALKPHOS 67  09/24/2016   BILITOT 0.3 09/24/2016     Microbiology: Recent Results (from the past 240 hour(s))  Urine Culture     Status: Abnormal   Collection Time: 09/20/16  3:47 PM  Result Value Ref Range Status   Specimen Description URINE, RANDOM  Final   Special Requests NONE  Final   Culture >=100,000 COLONIES/mL ENTEROBACTER AEROGENES (A)  Final   Report Status 09/22/2016 FINAL  Final   Organism ID, Bacteria ENTEROBACTER AEROGENES (A)  Final      Susceptibility   Enterobacter aerogenes - MIC*    CEFAZOLIN RESISTANT Resistant     CEFTRIAXONE <=1 SENSITIVE Sensitive     CIPROFLOXACIN <=0.25 SENSITIVE Sensitive     GENTAMICIN <=1 SENSITIVE Sensitive     IMIPENEM 0.5 SENSITIVE Sensitive     NITROFURANTOIN 32 SENSITIVE Sensitive     TRIMETH/SULFA <=20 SENSITIVE Sensitive     PIP/TAZO <=4 SENSITIVE Sensitive     * >=100,000 COLONIES/mL ENTEROBACTER AEROGENES  Blood culture (routine x 2)     Status: Abnormal   Collection Time: 09/20/16  3:51 PM  Result Value Ref Range Status   Specimen Description BLOOD LEFT ANTECUBITAL  Final   Special Requests   Final    BOTTLES DRAWN AEROBIC AND ANAEROBIC Blood Culture adequate volume   Culture  Setup Time   Final    GRAM POSITIVE COCCI IN CHAINS IN BOTH AEROBIC AND ANAEROBIC BOTTLES CRITICAL RESULT CALLED TO, READ BACK BY AND VERIFIED WITH: J [place],PHARMD AT 1610 09/21/16 BY L BENFIELD    Culture (A)  Final    GROUP A STREP (S.PYOGENES) ISOLATED HEALTH DEPARTMENT NOTIFIED SUSCEPTIBILITIES PERFORMED ON PREVIOUS CULTURE WITHIN THE LAST 5 DAYS.    Report Status 09/23/2016 FINAL  Final  Blood culture (routine x 2)     Status: Abnormal   Collection Time: 09/20/16  3:59 PM  Result Value Ref Range Status   Specimen Description BLOOD LEFT ANTECUBITAL  Final   Special Requests   Final    BOTTLES DRAWN AEROBIC AND ANAEROBIC Blood Culture adequate volume   Culture  Setup Time   Final    GRAM POSITIVE COCCI IN CHAINS IN BOTH AEROBIC AND ANAEROBIC  BOTTLES CRITICAL RESULT CALLED TO, READ BACK BY AND VERIFIED WITH: J [place],PHARMD AT 9604 09/21/16 BY L BENFIELD    Culture (A)  Final    GROUP A STREP (S.PYOGENES) ISOLATED HEALTH DEPARTMENT NOTIFIED CANDIDA KRUSEI DETECTED ON BCID. NOT RECOVERED IN CULTURE    Report Status 09/23/2016 FINAL  Final   Organism ID, Bacteria GROUP A STREP (S.PYOGENES) ISOLATED  Final      Susceptibility   Group a strep (s.pyogenes) isolated - MIC*    PENICILLIN <=0.06 SENSITIVE Sensitive  CEFTRIAXONE <=0.12 SENSITIVE Sensitive     ERYTHROMYCIN <=0.12 SENSITIVE Sensitive     LEVOFLOXACIN 0.5 SENSITIVE Sensitive     VANCOMYCIN 0.5 SENSITIVE Sensitive     * GROUP A STREP (S.PYOGENES) ISOLATED  Blood Culture ID Panel (Reflexed)     Status: Abnormal   Collection Time: 09/20/16  3:59 PM  Result Value Ref Range Status   Enterococcus species NOT DETECTED NOT DETECTED Final   Listeria monocytogenes NOT DETECTED NOT DETECTED Final   Staphylococcus species NOT DETECTED NOT DETECTED Final   Staphylococcus aureus NOT DETECTED NOT DETECTED Final   Streptococcus species DETECTED (A) NOT DETECTED Final    Comment: CRITICAL RESULT CALLED TO, READ BACK BY AND VERIFIED WITH: J [place],PHARMD AT 4098 09/21/16 BY L BENFIELD    Streptococcus agalactiae NOT DETECTED NOT DETECTED Final   Streptococcus pneumoniae NOT DETECTED NOT DETECTED Final   Streptococcus pyogenes DETECTED (A) NOT DETECTED Final    Comment: CRITICAL RESULT CALLED TO, READ BACK BY AND VERIFIED WITH: J [place],PHARMD AT 1191 09/21/16 BY L BENFIELD    Acinetobacter baumannii NOT DETECTED NOT DETECTED Final   Enterobacteriaceae species NOT DETECTED NOT DETECTED Final   Enterobacter cloacae complex NOT DETECTED NOT DETECTED Final   Escherichia coli NOT DETECTED NOT DETECTED Final   Klebsiella oxytoca NOT DETECTED NOT DETECTED Final   Klebsiella pneumoniae NOT DETECTED NOT DETECTED Final   Proteus species NOT DETECTED NOT DETECTED Final   Serratia  marcescens NOT DETECTED NOT DETECTED Final   Haemophilus influenzae NOT DETECTED NOT DETECTED Final   Neisseria meningitidis NOT DETECTED NOT DETECTED Final   Pseudomonas aeruginosa NOT DETECTED NOT DETECTED Final   Candida albicans NOT DETECTED NOT DETECTED Final   Candida glabrata NOT DETECTED NOT DETECTED Final   Candida krusei DETECTED (A) NOT DETECTED Final    Comment: CRITICAL RESULT CALLED TO, READ BACK BY AND VERIFIED WITH: J [place],PHARMD AT 4782 09/21/16 BY L BENFIELD    Candida parapsilosis NOT DETECTED NOT DETECTED Final   Candida tropicalis NOT DETECTED NOT DETECTED Final  Wound or Superficial Culture     Status: None   Collection Time: 09/20/16  6:26 PM  Result Value Ref Range Status   Specimen Description ABSCESS LEFT FOREARM  Final   Special Requests NONE  Final   Gram Stain   Final    ABUNDANT WBC PRESENT,BOTH PMN AND MONONUCLEAR RARE GRAM POSITIVE COCCI IN PAIRS    Culture   Final    ABUNDANT METHICILLIN RESISTANT STAPHYLOCOCCUS AUREUS ABUNDANT GROUP A STREP (S.PYOGENES) ISOLATED    Report Status 09/23/2016 FINAL  Final   Organism ID, Bacteria METHICILLIN RESISTANT STAPHYLOCOCCUS AUREUS  Final      Susceptibility   Methicillin resistant staphylococcus aureus - MIC*    CIPROFLOXACIN <=0.5 SENSITIVE Sensitive     ERYTHROMYCIN >=8 RESISTANT Resistant     GENTAMICIN <=0.5 SENSITIVE Sensitive     OXACILLIN >=4 RESISTANT Resistant     TETRACYCLINE <=1 SENSITIVE Sensitive     VANCOMYCIN <=0.5 SENSITIVE Sensitive     TRIMETH/SULFA <=10 SENSITIVE Sensitive     CLINDAMYCIN <=0.25 SENSITIVE Sensitive     RIFAMPIN <=0.5 SENSITIVE Sensitive     Inducible Clindamycin NEGATIVE Sensitive     * ABUNDANT METHICILLIN RESISTANT STAPHYLOCOCCUS AUREUS  Culture, blood (routine x 2)     Status: None (Preliminary result)   Collection Time: 09/22/16  5:25 AM  Result Value Ref Range Status   Specimen Description BLOOD LEFT HAND  Final   Special Requests IN  PEDIATRIC BOTTLE Blood  Culture adequate volume  Final   Culture NO GROWTH 3 DAYS  Final   Report Status PENDING  Incomplete  Culture, blood (routine x 2)     Status: None (Preliminary result)   Collection Time: 09/23/16  5:58 AM  Result Value Ref Range Status   Specimen Description BLOOD LEFT ARM  Final   Special Requests IN PEDIATRIC BOTTLE Blood Culture adequate volume  Final   Culture NO GROWTH 2 DAYS  Final   Report Status PENDING  Incomplete  Culture, blood (routine x 2)     Status: None (Preliminary result)   Collection Time: 09/23/16  6:05 AM  Result Value Ref Range Status   Specimen Description BLOOD LEFT ARM  Final   Special Requests IN PEDIATRIC BOTTLE Blood Culture adequate volume  Final   Culture NO GROWTH 2 DAYS  Final   Report Status PENDING  Incomplete  Aerobic/Anaerobic Culture (surgical/deep wound)     Status: None (Preliminary result)   Collection Time: 09/24/16  1:27 PM  Result Value Ref Range Status   Specimen Description ABSCESS RIGHT ARM  Final   Special Requests POF VANC AND ERAXIS  Final   Gram Stain   Final    ABUNDANT WBC PRESENT,BOTH PMN AND MONONUCLEAR RARE GRAM POSITIVE COCCI IN PAIRS    Culture NO GROWTH < 24 HOURS  Final   Report Status PENDING  Incomplete    Cliffton Asters, MD [facility] [phone] pager   [phone] cell 09/25/2016, 1:52 PM

## 2016-09-25 NOTE — Progress Notes (Signed)
   Subjective: Doing well over the interval, s/p I&D of abscesses on the right forearm. Pain well controlled. Discussed the results of the TEE and opthalmology consult. Discussed the need to aspirate and drain the right knee fluid, she denies it at this time. Discussed the risks. Denies fever, chills, HA, N/V, visual changes.   Objective: Vital signs in last 24 hours: Vitals:   09/24/16 1747 09/24/16 2234 09/25/16 0701 09/25/16 1027  BP: 107/68 110/66 117/69 121/71  Pulse: (!) 59 63 (!) 55 61  Resp: 16 18 18 16   Temp: 97.7 F (36.5 C)  98.6 F (37 C) 98.4 F (36.9 C)  TempSrc: Oral     SpO2: 95% 93% 97% 98%  Weight:      Height:       Physical Exam  Constitutional: She is oriented to person, place, and time and well-developed, well-nourished, and in no distress.  HENT:  Head: Normocephalic and atraumatic.  Eyes: Conjunctivae are normal. Pupils are equal, round, and reactive to light.  Cardiovascular: Normal rate, regular rhythm, normal heart sounds and intact distal pulses.  Exam reveals no friction rub.   No murmur heard. Pulmonary/Chest: Effort normal and breath sounds normal. No respiratory distress. She has no wheezes. She has no rales.  Abdominal: Soft. Bowel sounds are normal.  Musculoskeletal:  Right forearm wrapped, dressing clean and dry. Able to move all fingers, warm, and good cap refill. Erythema on the right knee improved but still appears to have fluid. Erythema of the left foot improved from prior exam, still significant swelling. Left forearm appears to be moving well.   Neurological: She is alert and oriented to person, place, and time.  Skin: Skin is warm and dry.  Psychiatric: Affect normal.   Assessment/Plan:  Principal Problem:   Fungemia Active Problems:   Cellulitis   Cellulitis of right arm   Cellulitis of right knee   Abscess of left forearm   Hypokalemia   Fall   Acute pain of right shoulder   Bacteremia   Abscess of forearm, right  Prepatellar bursitis, right knee  1. Fungemia and Bacteremia. 2/2 Strep pyogenes and Candida krusei  - Ortho consulted. Appreciate the recommendations; s/p right forearm I&D on 09/24/2016 follow-up in 2 weeks  - TEE illustrated no vegetations, masses, thrombosis, or valvular damage  - Opthalmology consulted; no fungal ball or retinal involvement  - Blood cultures on 09/22/2016 have shown NGTD - ID consulted. Appreciate the recommendations; will switch anidulafungin to oral voriconazole and treat for 11 more days (10/06/2016), and will administer 1 dose of IV oritavancin before discharge   2. Asymptomatic Bacteriuria. Enterobacter aerogenes - Currently not treating   3. IV Drug Abuse. Heroin  - HIV and Hepatitis B negative/non-reactive  - Hepatitis C quant not detectable, prior positive reaction on 08/29/2011 - Expresses desire to quit. Outpatient methadone. Will have her follow-up with [facility] or [facility]   4. Right shoulder pain.  - Continue Tylenol PRN  - Toradol 30 mg IV q6hrs  - Oxycodone IR 5 mg q4hrs  - Morphine 2 mg q3hrs  5. Right Prepatellar Bursitis. - Bedside ultrasound illustrated fluid with possible debris  - Pt declined aspiration on 07/10 and 07/11. Discussed the risks of not draining the bursitis    Dispo: Anticipated discharge in approximately 1 day(s).   Shannon Dawson, Shannon Cass, MD 09/25/2016, 2:56 PM Pager: My Pager: [phone]

## 2016-09-25 NOTE — Progress Notes (Signed)
[facility] and they currently have 20 voriconazole tablets in stock for a 10 day supply. We will give the patient one last dose of anidulafungin tomorrow morning. She can then start the voriconazole on Friday 7/13 to finish her course through 7/22. Care manager, outpatient pharmacy, and Dr. Orvan Falconerampbell are aware.   Della GooEmily S Korey Prashad, PharmD PGY2 Infectious Diseases Pharmacy Resident Pager: [phone]

## 2016-09-25 NOTE — Progress Notes (Signed)
Pharmacy Antibiotic Note  [name] is a 41 y.o. female admitted on 09/20/2016 with bacteremia.  Pharmacy has been consulted for vanc dosing.  Shannon Dawson has multitude of infectious issue with GAS bacteremia and candidemia. She is currently on ancef and anidulafungin. Dr Shannon Dawson saw her yesterday and will be seeing today also. She asked to place pt on IV vanc for now due to the MRSA in wound until she sees.   VT drawn a little late but therapeutic at 15.   Plan: Continue vancomycin 1,250mg  IV Q12h Continue Eraxis 100mg  IV Q24h Monitor clinical picture, renal function, VT prn F/U abx deescalation / LOT   Height: 5\' 3"  (160 cm) Weight: 150 lb (68 kg) IBW/kg (Calculated) : 52.4  Temp (24hrs), Avg:98 F (36.7 C), Min:97.3 F (36.3 C), Max:99 F (37.2 C)   Recent Labs Lab 09/20/16 1559  09/21/16 0842 09/21/16 1814 09/22/16 0544 09/22/16 0734 09/23/16 0558 09/24/16 0409 09/25/16 0413  WBC 10.5  --  9.4  --   --  8.3  --  8.6 7.3  CREATININE 0.81  < > 0.97 0.75 0.77  --  0.69 0.62 0.63  VANCOTROUGH  --   --   --   --   --   --   --   --  15  < > = values in this interval not displayed.  Estimated Creatinine Clearance: 85.6 mL/min (by C-G formula based on SCr of 0.63 mg/dL).    Allergies  Allergen Reactions  . Tramadol Hives and Itching    Severe  . Vicodin [Hydrocodone-Acetaminophen] Itching    Itching is severe.     Shannon Dawson, PharmD, BCPS Clinical Pharmacist Pager [phone] 09/25/2016 5:01 AM

## 2016-09-26 MED ORDER — ACETAMINOPHEN 500 MG PO TABS: 500.0000 mg | ORAL_TABLET | Freq: Four times a day (QID) | ORAL | 0 refills | Status: AC | PRN

## 2016-09-26 MED ORDER — DIPHENHYDRAMINE HCL 25 MG PO CAPS: 25.0000 mg | ORAL_CAPSULE | Freq: Four times a day (QID) | ORAL | 0 refills | Status: AC | PRN

## 2016-09-26 MED ORDER — VORICONAZOLE 200 MG PO TABS: 200.0000 mg | ORAL_TABLET | Freq: Two times a day (BID) | ORAL | 0 refills | Status: DC

## 2016-09-26 MED ORDER — IBUPROFEN 400 MG PO TABS: 400.0000 mg | ORAL_TABLET | Freq: Four times a day (QID) | ORAL | 0 refills | Status: AC | PRN

## 2016-09-26 MED ORDER — POTASSIUM CHLORIDE CRYS ER 20 MEQ PO TBCR
40.0000 meq | EXTENDED_RELEASE_TABLET | Freq: Once | ORAL | Status: AC
  Administered 2016-09-26: 40 meq via ORAL
  Filled 2016-09-26: qty 2

## 2016-09-26 MED FILL — VORICONAZOLE 200 MG TABLET: 200 | 10 days supply | Qty: 20 | Fill #0

## 2016-09-26 NOTE — Care Management Note (Addendum)
Case Management Note  Patient Details  Name: Servando SnareMary Shifflett MRN: [id number] Date of Birth: [birth year]  Subjective/Objective:           Admitted with Fugemia. States lives with friends.     Action/Plan:  Plan is to d/c after aspiration of knee today.   Pt explained and given Match Letter to cover Rx medication with an override on co pay cost. Pt's plan is to fill Rx @ [facility]  outpatient pharmacy which has medication in stock.  Pt states has transportation to home  once d/c.  Expected Discharge Date:                  Expected Discharge Plan:  Home/Self Care  In-House Referral:     Discharge planning Services  CM Consult  Post Acute Care Choice:    Choice offered to:     DME Arranged:    DME Agency:     HH Arranged:    HH Agency:     Status of Service:  Completed, signed off  If discussed at [employer] Length of Stay Meetings, dates discussed:    Additional Comments:  Epifanio LeschesCole, Cristi Gwynn Hudson, RN 09/26/2016, 10:20 AM

## 2016-09-26 NOTE — Discharge Summary (Signed)
Medicine attending discharge note: I personally examined this patient on the day of planned discharge and I attest to the accuracy of the discharge evaluation and plan as recorded in the final progress note by resident physician Dr. Levora DredgeJustin Helberg. 41 year old heroin abuser admitted on July 6 to be evaluated after she fell down and struck her right knee and shoulder.  On examination she was found to have abscesses on the right and left arms with associated cellulitis with additional areas of cellulitis over the right knee and involving the left foot.  No cardiac murmurs or other clinical signs of endocarditis. Blood cultures grew strep pyogenes and Candida Crusei.  Urine grew Enterobacter.  MRSA culture from the left arm abscess.  She was seen in consultation by orthopedic surgery and infectious disease.  She underwent incision and drainage of the right arm abscess on July 10.  4 separate areas of purulence were drained.  A volar fasciotomy was performed. She was treated with antibacterials and antifungals. Transesophageal echocardiogram did not show any vegetations. She developed a right prepatellar bursitis.  Plan for bedside drainage procedure on day of discharge. She will be changed to oral voriconazole to be taken through July 22. She was changed to a long-acting broad-spectrum antibiotic Oritavancin on July 11 for a single planned dose.Marland Kitchen. Disposition: Condition stable at discharge Survey cultures negative for bacteria and fungi Arrangements have been made for her to have the planned oral antifungal drug in hand at time of discharge. There were no complications.

## 2016-09-26 NOTE — Progress Notes (Signed)
 Assessment / Plan: 2 Days Post-Op  S/P Procedure(s) (LRB): INCISION AND DRAINAGE ABSCESS RIGHT ARM (Right) by Dr. Timothy D. Murphy on 09/24/16  Principal Problem:   Fungemia Active Problems:   Cellulitis   Cellulitis of right arm   Cellulitis of right knee   Abscess of left forearm   Hypokalemia   Fall   Acute pain of right shoulder   Bacteremia   Abscess of forearm, right   Prepatellar bursitis, right knee   Right prepatellar bursitis, infected - Plan for I/D washout, bursectomy, possible hardware removal tomorrow - 09/27/16 in the P.M.  History of ORIF/cerclage remotely (5 years ago dt fall). Recent fall onto knee. Erythema resolved, but pain increased.   4 cc of purulent fluid aspirated today.  The risks benefits and alternatives of the procedure were discussed with the patient.  The patient verbalizes understanding and wishes to proceed.     Status post I/D right forearm abscess Doing well.  Soreness in forearm significantly improved.   Continue antibiotic therapy per ID. Dressing change 09/27/2016. Remove sutures in about 2 weeks. Incentive Spirometry Elevate and apply ice  Weight Bearing: Weight Bearing as Tolerated (WBAT) RUE Dressings: Will plan to change forearm dressings in O.R. on 09/27/16. Dispo: per primary  Subjective: Patient reports pain as mild right forearm, but has been increasing in her knee over the past few days.   Objective:   VITALS:   Vitals:   09/25/16 1513 09/25/16 2216 09/26/16 0513 09/26/16 1404  BP: 140/80 137/75 137/74 138/78  Pulse: (!) 55 (!) 58 (!) 58 (!) 56  Resp: 20 18 18 20  Temp: 98.5 F (36.9 C) 98.6 F (37 C) 98.2 F (36.8 C) 98.4 F (36.9 C)  TempSrc:      SpO2: 99% 98% 96% 98%  Weight:      Height:       CBC Latest Ref Rng & Units 09/25/2016 09/24/2016 09/22/2016  WBC 4.0 - 10.5 K/uL 7.3 8.6 8.3  Hemoglobin 12.0 - 15.0 g/dL 9.7(L) 9.4(L) 10.4(L)  Hematocrit 36.0 - 46.0 % 30.9(L) 29.4(L) 31.0(L)  Platelets 150  - 400 K/uL 475(H) 438(H) 317   BMP Latest Ref Rng & Units 09/25/2016 09/24/2016 09/23/2016  Glucose 65 - 99 mg/dL 108(H) 91 95  BUN 6 - 20 mg/dL 5(L) <5(L) 7  Creatinine 0.44 - 1.00 mg/dL 0.63 0.62 0.69  Sodium 135 - 145 mmol/L 141 141 141  Potassium 3.5 - 5.1 mmol/L 3.2(L) 3.3(L) 3.2(L)  Chloride 101 - 111 mmol/L 108 110 109  CO2 22 - 32 mmol/L 27 23 24  Calcium 8.9 - 10.3 mg/dL 8.2(L) 8.1(L) 8.2(L)   Intake/Output      07/11 0701 - 07/12 0700 07/12 0701 - 07/13 0700   I.V. (mL/kg) 3 (0)    Total Intake(mL/kg) 3 (0)    Urine (mL/kg/hr)  600 (1.1)   Total Output   600   Net +3 -600        Urine Occurrence 12 x      Physical Exam: General: NAD.  Upright in bed. Calm, conversant. No increased work of breathing. MSK  Right Upper extremity: Neurovascularly intact Sensation intact distally Hand warm with moderate expected swelling. Incision: dressing C/D/I  Right Lower Extremity: Knee w/ bogginess over patella and some underlying fluctuance.  Significantly tender to palpation (increased from previous exam). No surrounding erythema / induration. ROM limited by pain 0-90 vs 0-120 on the left. NVI distally  Henry Calvin Martensen III, PA-C 09/26/2016, 3:13   PM

## 2016-09-26 NOTE — Progress Notes (Signed)
MD made aware of lab's unsuccessful attempts at obtaining blood for lab draws.

## 2016-09-26 NOTE — Progress Notes (Signed)
CSW received consult regarding transportation. CSW notes patient has been given homeless resources by previous CSW this admission. This CSW provided bus pass for patient since she has no available transportation.   CSW signing off.  Osborne Cascoadia Roneisha Stern LCSWA [phone]

## 2016-09-26 NOTE — Progress Notes (Signed)
Notified by lab that they are unable to obtain cell count on synovial fluid specimen. MD made aware.

## 2016-09-26 NOTE — Progress Notes (Signed)
Subjective: Shannon Dawson is in pain this morning, specifically relating to her right forearm and right knee. We discussed the option of aspiration today and she has agreed to have her knee drained prior to discharge. We discussed the plan to discharge on oral voriconazole and the end to continue to take the medication until 10/06/2016. Discussed the knee to follow-up with Ortho on the 09/27/2016 for stiches removal and wound inspection. She is agreeable with the plan. Denies fever, chills, soa, changes in vision, persistent HA.   Objective: Vital signs in last 24 hours: Vitals:   09/25/16 1027 09/25/16 1513 09/25/16 2216 09/26/16 0513  BP: 121/71 140/80 137/75 137/74  Pulse: 61 (!) 55 (!) 58 (!) 58  Resp: 16 20 18 18   Temp: 98.4 F (36.9 C) 98.5 F (36.9 C) 98.6 F (37 C) 98.2 F (36.8 C)  TempSrc:      SpO2: 98% 99% 98% 96%  Weight:      Height:       Physical Exam  Constitutional: She is oriented to person, place, and time and well-developed, well-nourished, and in no distress.  HENT:  Head: Normocephalic and atraumatic.  Eyes: Pupils are equal, round, and reactive to light. Conjunctivae are normal.  Cardiovascular: Normal rate, regular rhythm, normal heart sounds and intact distal pulses.  Exam reveals no friction rub.   No murmur heard. Pulmonary/Chest: Effort normal and breath sounds normal. She has no wheezes. She has no rales.  Abdominal: Soft. Bowel sounds are normal. There is no tenderness. There is no rebound and no guarding.  Musculoskeletal:  Right forearm dressing clean and dry. Able to move all fingers. Right knee fluctuating mass above the patella. Swelling of the lower extremities. Erythema of the left dorso-lateral foot improved from days prior. Bandage over left forearm with no surrounding erythema.   Neurological: She is alert and oriented to person, place, and time.  Skin: Skin is warm and dry.  Psychiatric: Memory and judgment normal.   Assessment/Plan:  Principal  Problem:   Fungemia Active Problems:   Cellulitis   Cellulitis of right arm   Cellulitis of right knee   Abscess of left forearm   Hypokalemia   Fall   Acute pain of right shoulder   Bacteremia   Abscess of forearm, right   Prepatellar bursitis, right knee  1. Fungemia and Bacteremia. 2/2 Strep pyogenes and Candida krusei  - Ortho consulted. Appreciate the recommendations; s/p right forearm I&D on 09/24/2016 follow-up on 09/27/2016 - TEE illustrated no vegetations, masses, thrombosis, or valvular damage  - Opthalmology consulted; no fungal ball or retinal involvement  - Blood cultures on 09/22/2016 and 09/23/2016 have shown NGTD - ID consulted. Appreciate the recommendations; will switch anidulafungin to oral voriconazole and treat for 11 more days (10/06/2016), and will administer 1 dose of IV oritavancin today before discharge  2. Asymptomatic Bacteriuria. Enterobacter aerogenes - Currently not treating   3. IV Drug Abuse. Heroin  - HIV and Hepatitis B negative/non-reactive  - Hepatitis C quant not detectable, prior positive reaction on 08/29/2011 - Expresses desire to quit. Outpatient methadone. Will have her follow-up with [facility]   4. Right shoulder pain.  - Continue Tylenol PRN  - Toradol 30 mg IV q6hrs  - Oxycodone IR 5 mg q4hrs  - Morphine 2 mg q3hrs  5. Right Prepatellar Bursitis. - Bedside ultrasound illustrated fluid with possible debris  - Shannon Dawson is agreeable to draining her knee prior to discharge today   Dispo: Anticipated discharge  in approximately 0 day(s).   Levora Dredge, MD 09/26/2016, 10:14 AM Pager: My Pager: [phone]

## 2016-09-26 NOTE — Procedures (Signed)
Prepatellar bursitis of right knee  Informed consent obtained. Ultrasound used to visualize the prepatellar bursa. Area prepared in sterile fashion. Anesthetized with 1% Lidocaine w/out Epi. 18 gauge needle inserted into the bursa with approximately 4cc of pus aspirated. No immediate complications.   Sample sent for gram stain, cultures (bacterial and fungal), and cell count.

## 2016-09-26 NOTE — Discharge Summary (Signed)
Name: Shannon Dawson MRN: [id number] DOB: 1975-03-20 41 y.o. PCP: Patient, No Pcp Per  Date of Admission: 09/20/2016  2:11 PM Date of Discharge: 09/29/2016 Attending Physician: Doneen Poisson, MD  Discharge Diagnosis: 1. Fungemia and Bacteremia. 2/2 Strep pyogenes and Candida krusei  2. Cellulitis  3. Asymptomatic Bacteruria  4. IV Drug Abuse  5. Right Shoulder Pain  6. Right Prepatellar Bursitis   Principal Problem:   Fungemia Active Problems:   Cellulitis of right arm   Cellulitis of right knee   Abscess of left forearm   Acute pain of right shoulder   Bacteremia   Abscess of forearm, right   Prepatellar bursitis, right knee  Discharge Medications: Allergies as of 09/29/2016      Reactions   Tramadol Hives, Itching   Severe   Vicodin [hydrocodone-acetaminophen] Itching   Itching is severe.      Medication List    TAKE these medications   acetaminophen 500 MG tablet Commonly known as:  TYLENOL Take 1 tablet (500 mg total) by mouth every 6 (six) hours as needed (pain).   diphenhydrAMINE 25 mg capsule Commonly known as:  BENADRYL Take 1 capsule (25 mg total) by mouth every 6 (six) hours as needed for itching.   ibuprofen 400 MG tablet Commonly known as:  ADVIL,MOTRIN Take 1 tablet (400 mg total) by mouth every 6 (six) hours as needed for moderate pain. What changed:  medication strength  how much to take   ibuprofen 800 MG tablet Commonly known as:  ADVIL,MOTRIN Take 1 tablet (800 mg total) by mouth every 8 (eight) hours as needed. What changed:  medication strength  how much to take  when to take this   oxyCODONE-acetaminophen 5-325 MG tablet Commonly known as:  PERCOCET/ROXICET Take 1-2 tablets by mouth every 6 (six) hours as needed for severe pain. What changed:  Another medication with the same name was added. Make sure you understand how and when to take each.   oxyCODONE-acetaminophen 5-325 MG tablet Commonly known as:  ROXICET Take 1  tablet by mouth every 6 (six) hours as needed. What changed:  You were already taking a medication with the same name, and this prescription was added. Make sure you understand how and when to take each.   voriconazole 200 MG tablet Commonly known as:  VFEND Take 1 tablet (200 mg total) by mouth 2 (two) times daily.   voriconazole 200 MG tablet Commonly known as:  VFEND Take 1 tablet (200 mg total) by mouth every 12 (twelve) hours.            Durable Medical Equipment        Start     Ordered   09/22/16 1528  For home use only DME Crutches  Once     09/22/16 1527     Disposition and follow-up:   Ms.Shannon Dawson was discharged from [facility] in Good condition.  At the hospital follow up visit please address:  1.   Fungemia and Bacteremia. 2/2 Strep pyogenes and Candida krusei -- Were you able to finish your prescription of vorconazole? -- Have you had any fevers or chills?  IV Drug Abuse -- Were to able to follow-up at [facility] or [facility] to begin Methadone therapy? -- If not, are you still using IV Heroin?  Asymptomatic Bacteruria -- Do you have any dysuria or urinary frequency?   2.  Labs / imaging needed at time of follow-up: Will need blood cultures 4-6 weeks after finishing  treatment on 10/06/2016. Will need CMP since taking Voriconazole.   3.  Pending labs/ test needing follow-up: N/A  Follow-up Appointments: Follow-up Information    Sheral ApleyMurphy, Timothy D, MD Follow up in 1 week(s).   Specialty:  Orthopedic Surgery Contact information: [address] [place] [address] [phone]        [facility] Follow up in 1 week(s).   Why:  Will ned to follow up in 1 week and then again in 5 weeks for repeat blood cultures.  Contact information: [address] [place] [phone]       [facility] Go in 1 day(s).   [facility] Contact information: [address] [place] [address] [phone]        [facility] Go in 1 day(s).   Contact information: [address] [place] [address] [phone]          Hospital Course by problem list: Principal Problem:   Fungemia Active Problems:   Cellulitis of right arm   Cellulitis of right knee   Abscess of left forearm   Acute pain of right shoulder   Bacteremia   Abscess of forearm, right   Prepatellar bursitis, right knee   1. Fungemia and Bacteremia. 2/2 Strep pyogenes and Candida krusei. Shannon Dawson presented on to the ED on 09/20/2016 after sustaining a fall, injuring her right shoulder and right knee. In the ED it was noted that she had right forearm cellulitis and an abscess on the left forearm 4 inches proximal to the radial styloid. The abscess on the left forearm was drained in the ED and subsequently grew MRSA. Blood cultures on admission were positive for Strep pyogenes and Candida krusei. She was started on Cefazolin and Eraxis, ID was consulted. Cefazolin was discontinued and Shannon Dawson was started on Vancomycin for MRSA coverage. Subsequent blood cultures (09/22/2016 and 09/23/2016) were sterile, showing no growth throughout the hospitalization. A TEE was preformed that illustrated no vegetations, no thrombosis, no masses, and valvular damage. An opthalmology consult to rule out endophthalmitis was done and showed no fungal ball or retinal involvement. She was discharged on 09/29/2016 after receiving a loading dose of IV oritavancin on 09/26/2016 to cover for MRSA and Strep pyogenes, and oral voriconazole. She will take the Voriconazole for 2 weeks since sterile cultures, and will stop taking it on 10/06/2016. She will need blood cultures 4-6 weeks after finishing the Voriconazole.   2. Cellulitis. Shannon Dawson presented with cellulitis of the right forearm, an abscess proximal to the left radial styloid, right prepatellar busitis,  and left dorso-lateral foot cellulitis. The abscess on her left radial styloid was I&D in the ED on 09/20/2016, cultures grew MRSA. Her right forearm cellulitis continued to improve; however, did form 5 abscesses. Ortho was consulted an did I&D the abscesses on 09/24/2016, with no immediate post-op complications. On 09/26/2016, a right pre-pateller bursa aspiration was performed due to increasing erythema and a fluctuant mass. Approximately 4cc of pus was aspirated and sent for cultures and gram stain. Gram stain illustrated gram positive cocci in pairs. Ortho was notified and she was taken for a pre-pateller bursectomy of the right knee with hardware removal. There were no immediate complications. Surgical specimen again showed gram positive cocci in chains. No further antibiotics were given aside from the IV oritavacin on 09/26/2016. She will follow-up with orthopedics as an outpatient.   3. Asymptomatic  Bacteruria. UA illustrated signs of a UTI, subsequent cultures grew >100k colonies of enterobacter aerogenes. Shannon Dawson was asymptomatic denying any dysuria, increased frequency or abdominal pain. She was not directly treated for this problem.    4. IV Drug Abuse. Shannon Dawson expressed a desire to quit using IV heroin multiple times while in the hospital. The options were discussed and she elected to do methadone as she felt it worked better that Shannon Dawson. She was given information about 2 clinic that do not require referrals, [facility] and [facility]. She plans to follow-up there upon discharge to begin Methadone therapy.   5. Right Shoulder Pain. Shannon Dawson presented on 09/20/2016 after falling and injuring her right shoulder. Orthopedics did a thurough evaluation including an shoulder x-rays and ultrasound guided exam. No fractures were noted. Suspected contusion or mild AC injury was the differential diagnosis. She was offered a sling but refused. Pain continued to improve over the course of the hospitalization.   6. Right  Prepatellar Bursitis. Shannon Dawson's knee pain continued to progress and a likely abscess was noted by ultrasound. On 09/26/2016 On 09/26/2016, a right pre-pateller bursa aspiration was performed due to increasing erythema and a fluctuant mass. Approximately 4cc of pus was aspirated and sent for cultures and gram stain. Gram stain illustrated gram positive cocci in pairs. Ortho was notified and taken for a pre-pateller bursectomy of the right knee with hardware removal. There were no immediate complications. Surgical specimen again showed gram positive cocci in chains. Treatment is noted above.   Discharge Vitals:   BP 125/75 (BP Location: Left Arm)   Pulse (!) 55   Temp 97.6 F (36.4 C)   Resp 18   Ht 5\' 3"  (1.6 m)   Wt 150 lb (68 kg)   LMP 09/18/2016   SpO2 96%   BMI 26.57 kg/m   Pertinent Labs, Studies, and Procedures:  X-Ray Right Knee 4 views IMPRESSION: 1. Prepatellar swelling without acute osseous finding or opaque foreign body. 2. Remote patella and tibial ORIF.  X-Ray Chest 2 Views IMPRESSION: No active cardiopulmonary disease.  X-Ray Right Shoulder IMPRESSION: Negative.  [place] Right Upper Extremity  IMPRESSION: Two tiny collections in the subcutaneous tissues as described above are most consistent with phlegmon or early abscess.  Discharge Instructions: Discharge Instructions    Diet - low sodium heart healthy    Complete by:  As directed    Increase activity slowly    Complete by:  As directed      Signed: Levora Dredge, MD 09/29/2016, 11:04 AM   Pager: My Pager: [phone]

## 2016-09-27 ENCOUNTER — Encounter (HOSPITAL_COMMUNITY)
Admission: EM | Disposition: A | Discharge status: Home / Self Care | Payer: Self-pay | Source: Home / Self Care | Attending: Internal Medicine

## 2016-09-27 ENCOUNTER — Inpatient Hospital Stay (HOSPITAL_COMMUNITY): Payer: Self-pay | Admitting: Critical Care Medicine

## 2016-09-27 ENCOUNTER — Inpatient Hospital Stay (HOSPITAL_COMMUNITY): Payer: Self-pay

## 2016-09-27 ENCOUNTER — Encounter (HOSPITAL_COMMUNITY): Payer: Self-pay | Admitting: Internal Medicine

## 2016-09-27 HISTORY — PX: HARDWARE REMOVAL: SHX979

## 2016-09-27 HISTORY — PX: IRRIGATION AND DEBRIDEMENT KNEE: SHX5185

## 2016-09-27 LAB — BASIC METABOLIC PANEL
ANION GAP: 6 (ref 5–15)
BUN: 5 mg/dL — ABNORMAL LOW (ref 6–20)
CALCIUM: 9 mg/dL (ref 8.9–10.3)
CO2: 26 mmol/L (ref 22–32)
Chloride: 103 mmol/L (ref 101–111)
Creatinine, Ser: 0.66 mg/dL (ref 0.44–1.00)
GFR calc non Af Amer: >60 mL/min (ref >60)
Glucose, Bld: 86 mg/dL (ref 65–99)
Potassium: 3.5 mmol/L (ref 3.5–5.1)
Sodium: 135 mmol/L (ref 135–145)

## 2016-09-27 LAB — GLUCOSE, CAPILLARY: GLUCOSE-CAPILLARY: 93 mg/dL (ref 65–99)

## 2016-09-27 LAB — CBC
HCT: 32.5 % — ABNORMAL LOW (ref 36.0–46.0)
HEMOGLOBIN: 10.4 g/dL — AB (ref 12.0–15.0)
MCH: 29.1 pg (ref 26.0–34.0)
MCHC: 32 g/dL (ref 30.0–36.0)
MCV: 91 fL (ref 78.0–100.0)
Platelets: 494 10*3/uL — ABNORMAL HIGH (ref 150–400)
RBC: 3.57 MIL/uL — AB (ref 3.87–5.11)
RDW: 17.8 % — ABNORMAL HIGH (ref 11.5–15.5)
WBC: 8.3 10*3/uL (ref 4.0–10.5)

## 2016-09-27 LAB — CULTURE, BLOOD (ROUTINE X 2)
Culture: NO GROWTH
Special Requests: ADEQUATE

## 2016-09-27 LAB — SURGICAL PCR SCREEN
MRSA, PCR: NEGATIVE
Staphylococcus aureus: NEGATIVE

## 2016-09-27 SURGERY — IRRIGATION AND DEBRIDEMENT KNEE
Anesthesia: General | Site: Knee | Laterality: Right

## 2016-09-27 MED ORDER — DEXAMETHASONE SODIUM PHOSPHATE 10 MG/ML IJ SOLN
INTRAMUSCULAR | Status: AC
  Filled 2016-09-27: qty 2

## 2016-09-27 MED ORDER — KETOROLAC TROMETHAMINE 15 MG/ML IJ SOLN
15.0000 mg | Freq: Once | INTRAMUSCULAR | Status: AC
  Administered 2016-09-27: 15 mg via INTRAVENOUS
  Filled 2016-09-27: qty 1

## 2016-09-27 MED ORDER — ONDANSETRON HCL 4 MG/2ML IJ SOLN
INTRAMUSCULAR | Status: AC
  Filled 2016-09-27: qty 4

## 2016-09-27 MED ORDER — CEFAZOLIN SODIUM-DEXTROSE 2-4 GM/100ML-% IV SOLN
2.0000 g | INTRAVENOUS | Status: AC
  Administered 2016-09-27: 2 g via INTRAVENOUS
  Filled 2016-09-27: qty 100

## 2016-09-27 MED ORDER — LACTATED RINGERS IV SOLN
INTRAVENOUS | Status: DC
  Administered 2016-09-27 (×2): via INTRAVENOUS

## 2016-09-27 MED ORDER — CHLORHEXIDINE GLUCONATE 4 % EX LIQD
60.0000 mL | Freq: Once | CUTANEOUS | Status: AC
  Administered 2016-09-27: 4 via TOPICAL
  Filled 2016-09-27: qty 60

## 2016-09-27 MED ORDER — OXYCODONE HCL 5 MG PO TABS
5.0000 mg | ORAL_TABLET | Freq: Once | ORAL | Status: AC | PRN
  Administered 2016-09-27: 5 mg via ORAL

## 2016-09-27 MED ORDER — BUPIVACAINE HCL (PF) 0.25 % IJ SOLN
INTRAMUSCULAR | Status: AC
  Filled 2016-09-27: qty 30

## 2016-09-27 MED ORDER — KETAMINE HCL-SODIUM CHLORIDE 100-0.9 MG/10ML-% IV SOSY
PREFILLED_SYRINGE | INTRAVENOUS | Status: AC
  Filled 2016-09-27: qty 10

## 2016-09-27 MED ORDER — SODIUM CHLORIDE 0.9 % IR SOLN
Status: DC | PRN
  Administered 2016-09-27: 3000 mL

## 2016-09-27 MED ORDER — DEXAMETHASONE SODIUM PHOSPHATE 10 MG/ML IJ SOLN
INTRAMUSCULAR | Status: DC | PRN
Start: 2016-09-27 — End: 2016-09-27
  Administered 2016-09-27: 10 mg via INTRAVENOUS

## 2016-09-27 MED ORDER — PROPOFOL 10 MG/ML IV BOLUS
INTRAVENOUS | Status: DC | PRN
  Administered 2016-09-27: 200 mg via INTRAVENOUS

## 2016-09-27 MED ORDER — FENTANYL CITRATE (PF) 250 MCG/5ML IJ SOLN
INTRAMUSCULAR | Status: DC | PRN
  Administered 2016-09-27: 25 ug via INTRAVENOUS
  Administered 2016-09-27: 50 ug via INTRAVENOUS
  Administered 2016-09-27 (×2): 25 ug via INTRAVENOUS
  Administered 2016-09-27 (×2): 50 ug via INTRAVENOUS
  Administered 2016-09-27: 25 ug via INTRAVENOUS

## 2016-09-27 MED ORDER — MIDAZOLAM HCL 2 MG/2ML IJ SOLN
INTRAMUSCULAR | Status: DC | PRN
  Administered 2016-09-27: 2 mg via INTRAVENOUS

## 2016-09-27 MED ORDER — PROPOFOL 10 MG/ML IV BOLUS
INTRAVENOUS | Status: AC
  Filled 2016-09-27: qty 20

## 2016-09-27 MED ORDER — ONDANSETRON HCL 4 MG/2ML IJ SOLN
INTRAMUSCULAR | Status: DC | PRN
  Administered 2016-09-27: 4 mg via INTRAVENOUS

## 2016-09-27 MED ORDER — OXYCODONE HCL 5 MG PO TABS
ORAL_TABLET | ORAL | Status: AC
  Administered 2016-09-27: 16:00:00
  Filled 2016-09-27: qty 1

## 2016-09-27 MED ORDER — LIDOCAINE 2% (20 MG/ML) 5 ML SYRINGE
INTRAMUSCULAR | Status: DC | PRN
  Administered 2016-09-27: 80 mg via INTRAVENOUS

## 2016-09-27 MED ORDER — MIDAZOLAM HCL 2 MG/2ML IJ SOLN
INTRAMUSCULAR | Status: AC
  Filled 2016-09-27: qty 2

## 2016-09-27 MED ORDER — OXYCODONE HCL 5 MG/5ML PO SOLN: 5.0000 mg | Freq: Once | ORAL | Status: AC | PRN

## 2016-09-27 MED ORDER — HYDROMORPHONE HCL 1 MG/ML IJ SOLN
0.2500 mg | INTRAMUSCULAR | Status: DC | PRN
  Administered 2016-09-27 (×4): 0.5 mg via INTRAVENOUS

## 2016-09-27 MED ORDER — HYDROMORPHONE HCL 1 MG/ML IJ SOLN
0.5000 mg | INTRAMUSCULAR | Status: DC | PRN
  Administered 2016-09-27 – 2016-09-29 (×10): 0.5 mg via INTRAVENOUS
  Filled 2016-09-27 (×10): qty 0.5

## 2016-09-27 MED ORDER — HYDROMORPHONE HCL 1 MG/ML IJ SOLN
INTRAMUSCULAR | Status: AC
  Administered 2016-09-27: 16:00:00
  Filled 2016-09-27: qty 1

## 2016-09-27 MED ORDER — ONDANSETRON HCL 4 MG/2ML IJ SOLN: 4.0000 mg | Freq: Four times a day (QID) | INTRAMUSCULAR | Status: DC | PRN

## 2016-09-27 MED ORDER — KETAMINE HCL 10 MG/ML IJ SOLN
INTRAMUSCULAR | Status: DC | PRN
  Administered 2016-09-27: 30 mg via INTRAVENOUS
  Administered 2016-09-27: 40 mg via INTRAVENOUS
  Administered 2016-09-27: 30 mg via INTRAVENOUS

## 2016-09-27 MED ORDER — FENTANYL CITRATE (PF) 250 MCG/5ML IJ SOLN
INTRAMUSCULAR | Status: AC
  Filled 2016-09-27: qty 5

## 2016-09-27 SURGICAL SUPPLY — 63 items
BANDAGE ACE 4X5 VEL STRL LF (GAUZE/BANDAGES/DRESSINGS) ×4 IMPLANT
BANDAGE ACE 6X5 VEL STRL LF (GAUZE/BANDAGES/DRESSINGS) ×4 IMPLANT
BANDAGE ESMARK 6X9 LF (GAUZE/BANDAGES/DRESSINGS) IMPLANT
BLADE SURG 10 STRL SS (BLADE) ×4 IMPLANT
BNDG CMPR 9X4 STRL LF SNTH (GAUZE/BANDAGES/DRESSINGS)
BNDG CMPR 9X6 STRL LF SNTH (GAUZE/BANDAGES/DRESSINGS)
BNDG CMPR MED 10X6 ELC LF (GAUZE/BANDAGES/DRESSINGS) ×2
BNDG COHESIVE 4X5 TAN STRL (GAUZE/BANDAGES/DRESSINGS) ×1 IMPLANT
BNDG ELASTIC 6X10 VLCR STRL LF (GAUZE/BANDAGES/DRESSINGS) ×3 IMPLANT
BNDG ESMARK 4X9 LF (GAUZE/BANDAGES/DRESSINGS) IMPLANT
BNDG ESMARK 6X9 LF (GAUZE/BANDAGES/DRESSINGS)
BNDG GAUZE ELAST 4 BULKY (GAUZE/BANDAGES/DRESSINGS) ×4 IMPLANT
CNTNR SPEC C3OZ STD GRAD LEK (MISCELLANEOUS) ×1 IMPLANT
CONT SPEC 3OZ W/LID STRL (MISCELLANEOUS) ×4
CONT SPEC 4OZ CLIKSEAL STRL BL (MISCELLANEOUS) ×3 IMPLANT
COVER SURGICAL LIGHT HANDLE (MISCELLANEOUS) ×4 IMPLANT
CUFF TOURN SGL LL 12 NO SLV (MISCELLANEOUS) IMPLANT
CUFF TOURNIQUET SINGLE 34IN LL (TOURNIQUET CUFF) IMPLANT
DRAPE C-ARM 35X43 STRL (DRAPES) ×3 IMPLANT
DRAPE SURG 17X23 STRL (DRAPES) IMPLANT
DRAPE U-SHAPE 47X51 STRL (DRAPES) IMPLANT
DRSG PAD ABDOMINAL 8X10 ST (GAUZE/BANDAGES/DRESSINGS) ×7 IMPLANT
DURAPREP 26ML APPLICATOR (WOUND CARE) ×4 IMPLANT
ELECT REM PT RETURN 9FT ADLT (ELECTROSURGICAL)
ELECTRODE REM PT RTRN 9FT ADLT (ELECTROSURGICAL) IMPLANT
EVACUATOR 1/8 PVC DRAIN (DRAIN) IMPLANT
FACESHIELD WRAPAROUND (MASK) IMPLANT
FACESHIELD WRAPAROUND OR TEAM (MASK) IMPLANT
GAUZE SPONGE 4X4 12PLY STRL (GAUZE/BANDAGES/DRESSINGS) ×4 IMPLANT
GAUZE SPONGE 4X4 12PLY STRL LF (GAUZE/BANDAGES/DRESSINGS) ×3 IMPLANT
GAUZE XEROFORM 1X8 LF (GAUZE/BANDAGES/DRESSINGS) ×7 IMPLANT
GLOVE BIO SURGEON STRL SZ7.5 (GLOVE) ×8 IMPLANT
GLOVE BIOGEL PI IND STRL 7.0 (GLOVE) ×1 IMPLANT
GLOVE BIOGEL PI IND STRL 8 (GLOVE) ×4 IMPLANT
GLOVE BIOGEL PI INDICATOR 7.0 (GLOVE) ×2
GLOVE BIOGEL PI INDICATOR 8 (GLOVE) ×4
GLOVE SURG SS PI 7.0 STRL IVOR (GLOVE) ×3 IMPLANT
GOWN STRL REUS W/ TWL LRG LVL3 (GOWN DISPOSABLE) ×6 IMPLANT
GOWN STRL REUS W/TWL LRG LVL3 (GOWN DISPOSABLE) ×12
HANDPIECE INTERPULSE COAX TIP (DISPOSABLE)
KIT BASIN OR (CUSTOM PROCEDURE TRAY) ×4 IMPLANT
KIT ROOM TURNOVER OR (KITS) ×4 IMPLANT
MANIFOLD NEPTUNE II (INSTRUMENTS) ×4 IMPLANT
NEEDLE 25GAX1.5 (MISCELLANEOUS) IMPLANT
NS IRRIG 1000ML POUR BTL (IV SOLUTION) ×4 IMPLANT
PACK ORTHO EXTREMITY (CUSTOM PROCEDURE TRAY) ×4 IMPLANT
PAD ABD 8X10 STRL (GAUZE/BANDAGES/DRESSINGS) ×3 IMPLANT
PAD ARMBOARD 7.5X6 YLW CONV (MISCELLANEOUS) ×8 IMPLANT
PADDING CAST COTTON 6X4 STRL (CAST SUPPLIES) ×3 IMPLANT
SET HNDPC FAN SPRY TIP SCT (DISPOSABLE) IMPLANT
SPONGE LAP 18X18 X RAY DECT (DISPOSABLE) IMPLANT
STOCKINETTE IMPERVIOUS 9X36 MD (GAUZE/BANDAGES/DRESSINGS) ×4 IMPLANT
SUT ETHILON 3 0 PS 1 (SUTURE) ×6 IMPLANT
SUT PDS AB 2-0 CT1 27 (SUTURE) IMPLANT
SWAB CULTURE ESWAB REG 1ML (MISCELLANEOUS) IMPLANT
SYR CONTROL 10ML LL (SYRINGE) IMPLANT
TOWEL OR 17X24 6PK STRL BLUE (TOWEL DISPOSABLE) ×4 IMPLANT
TOWEL OR 17X26 10 PK STRL BLUE (TOWEL DISPOSABLE) ×4 IMPLANT
TUBE CONNECTING 12'X1/4 (SUCTIONS) ×1
TUBE CONNECTING 12X1/4 (SUCTIONS) ×3 IMPLANT
TUBING CYSTO DISP (UROLOGICAL SUPPLIES) ×3 IMPLANT
UNDERPAD 30X30 (UNDERPADS AND DIAPERS) ×4 IMPLANT
YANKAUER SUCT BULB TIP NO VENT (SUCTIONS) ×4 IMPLANT

## 2016-09-27 NOTE — Anesthesia Preprocedure Evaluation (Signed)
Anesthesia Evaluation  Patient identified by MRN, date of birth, ID band Patient awake    Reviewed: Allergy & Precautions, H&P , NPO status , Patient's Chart, lab work & pertinent test results  Airway Mallampati: II   Neck ROM: full    Dental   Pulmonary Current Smoker,    breath sounds clear to auscultation       Cardiovascular negative cardio ROS   Rhythm:regular Rate:Normal     Neuro/Psych  Headaches, PSYCHIATRIC DISORDERS Anxiety Depression Bipolar Disorder    GI/Hepatic   Endo/Other    Renal/GU      Musculoskeletal  (+) Arthritis ,   Abdominal   Peds  Hematology   Anesthesia Other Findings   Reproductive/Obstetrics                             Anesthesia Physical Anesthesia Plan  ASA: II  Anesthesia Plan: General   Post-op Pain Management:    Induction: Intravenous  PONV Risk Score and Plan: 2 and Ondansetron, Dexamethasone and Treatment may vary due to age or medical condition  Airway Management Planned: LMA  Additional Equipment:   Intra-op Plan:   Post-operative Plan:   Informed Consent: I have reviewed the patients History and Physical, chart, labs and discussed the procedure including the risks, benefits and alternatives for the proposed anesthesia with the patient or authorized representative who has indicated his/her understanding and acceptance.     Plan Discussed with: CRNA, Anesthesiologist and Surgeon  Anesthesia Plan Comments:         Anesthesia Quick Evaluation

## 2016-09-27 NOTE — Progress Notes (Signed)
   Subjective: Corrie DandyMary is doing well this AM. Still having pain in her right forearm but her right knee pain has improved. Discussed the plan for ortho to take her to the OR today. Agreeable with the current plan. Denies fever, chills, visual changes.   Objective: Vital signs in last 24 hours: Vitals:   09/26/16 1404 09/26/16 2106 09/27/16 0215 09/27/16 0537  BP: 138/78 (!) 143/81 139/85 136/80  Pulse: (!) 56 (!) 57 (!) 57 (!) 55  Resp: 20 18 18    Temp: 98.4 F (36.9 C) 98.4 F (36.9 C) 98.2 F (36.8 C) 98.2 F (36.8 C)  TempSrc:  Oral Oral Oral  SpO2: 98% 96% 99% 96%  Weight:      Height:       Physical Exam  Constitutional: She is oriented to person, place, and time. She appears well-developed and well-nourished.  HENT:  Head: Normocephalic and atraumatic.  Eyes: Pupils are equal, round, and reactive to light. Conjunctivae are normal.  Cardiovascular: Normal rate, regular rhythm, normal heart sounds and intact distal pulses.  Exam reveals no friction rub.   No murmur heard. Pulmonary/Chest: Effort normal and breath sounds normal. She has no wheezes. She has no rales.  Abdominal: Soft. Bowel sounds are normal. She exhibits no distension. There is no tenderness. There is no guarding.  Musculoskeletal: She exhibits no edema.  Neurological: She is alert and oriented to person, place, and time.  Skin: Skin is warm and dry.  Psychiatric: She has a normal mood and affect. Her behavior is normal. Thought content normal.   Assessment/Plan: Principal Problem:   Fungemia Active Problems:   Cellulitis   Cellulitis of right arm   Cellulitis of right knee   Abscess of left forearm   Hypokalemia   Fall   Acute pain of right shoulder   Bacteremia   Abscess of forearm, right   Prepatellar bursitis, right knee  1. Fungemia and Bacteremia. 2/2 Strep pyogenes and Candida krusei  - Ortho consulted. Will take Corrie DandyMary to the OR this PM for right prepatellar bursa washing and possible hardware  removal  - Blood cultures on 09/22/2016 and 09/23/2016 have shown NGTD - ID consulted. Appreciate the recommendations; received IV oritavancin on 09/26/2016. Will start PO voriconazole today (09/27/2016)   2. Asymptomatic Bacteriuria. Enterobacter aerogenes - Currently not treating   3. IV Drug Abuse. Heroin  - HIV and Hepatitis B negative/non-reactive  - Hepatitis C quant not detectable, prior positive reaction on 08/29/2011 - Expresses desire to quit. Outpatient methadone. Will have her follow-up with [facility]   4. Right shoulder pain.  - Continue Tylenol PRN  - Toradol 30 mg IV q6hrs  - Oxycodone IR 5 mg q4hrs  - Morphine 2 mg q3hrs  5. Right Prepatellar Bursitis. - Bedside ultrasound illustrated fluid with possible debris  - Aspiration on 09/26/2016, 4mL fluid removed sent for cultures. Gram stain illustrated Gram positive cocci in pairs.   Dispo: Anticipated discharge in approximately 1-2 day(s).   Levora DredgeHelberg, Love [place], MD 09/27/2016, 12:07 PM Pager: My Pager: [phone]

## 2016-09-27 NOTE — Progress Notes (Signed)
Paged by RN earlier tonight. Patient's pain not controlled with Oxy IR 5 mg q4hr prn and morphine 2 mg q3hr prn. Reports neither medication touches her pain. Stopped the morphine order and ordered Dilaudid 0.5 mg q2hr prn. Patient received dose of Dilaudid with drastic improvement in paged.  Paged by RN again, patient reporting rolling waves of muscles pain. RN requesting additional orders. Saw and evaluated patient. She is sitting up in bed, resting comfortably in no distress. She reports that her knee pain is doing much better. Reports previously it was greater than 10/10 but after receiving the dose of dilaudid it is down to 5/10, which she reports is manageable for her. Denies any muscle cramps. Right knee is bandaged from procedure earlier today. Intact distal pulses, normal strength and sensation.  Will continue pain management with above orders. No need for further interventions currently.

## 2016-09-27 NOTE — Interval H&P Note (Signed)
History and Physical Interval Note:  09/27/2016 1:30 PM  Shannon Dawson  has presented today for surgery, with the diagnosis of Bursitis  The various methods of treatment have been discussed with the patient and family. After consideration of risks, benefits and other options for treatment, the patient has consented to  Procedure(s): PRE-PATELLER BURSECTOMY RIGHT W/ HARDWARE REMOVAL (SCREW) (Right) as a surgical intervention .  The patient's history has been reviewed, patient examined, no change in status, stable for surgery.  I have reviewed the patient's chart and labs.  Questions were answered to the patient's satisfaction.     Nonna Renninger D

## 2016-09-27 NOTE — H&P (View-Only) (Signed)
Assessment / Plan: 2 Days Post-Op  S/P Procedure(s) (LRB): INCISION AND DRAINAGE ABSCESS RIGHT ARM (Right) by Dr. Jewel Baizeimothy D. Murphy on 09/24/16  Principal Problem:   Fungemia Active Problems:   Cellulitis   Cellulitis of right arm   Cellulitis of right knee   Abscess of left forearm   Hypokalemia   Fall   Acute pain of right shoulder   Bacteremia   Abscess of forearm, right   Prepatellar bursitis, right knee   Right prepatellar bursitis, infected - Plan for I/D washout, bursectomy, possible hardware removal tomorrow - 09/27/16 in the P.M.  History of ORIF/cerclage remotely (5 years ago dt fall). Recent fall onto knee. Erythema resolved, but pain increased.   4 cc of purulent fluid aspirated today.  The risks benefits and alternatives of the procedure were discussed with the patient.  The patient verbalizes understanding and wishes to proceed.     Status post I/D right forearm abscess Doing well.  Soreness in forearm significantly improved.   Continue antibiotic therapy per ID. Dressing change 09/27/2016. Remove sutures in about 2 weeks. Incentive Spirometry Elevate and apply ice  Weight Bearing: Weight Bearing as Tolerated (WBAT) RUE Dressings: Will plan to change forearm dressings in O.R. on 09/27/16. Dispo: per primary  Subjective: Patient reports pain as mild right forearm, but has been increasing in her knee over the past few days.   Objective:   VITALS:   Vitals:   09/25/16 1513 09/25/16 2216 09/26/16 0513 09/26/16 1404  BP: 140/80 137/75 137/74 138/78  Pulse: (!) 55 (!) 58 (!) 58 (!) 56  Resp: 20 18 18 20   Temp: 98.5 F (36.9 C) 98.6 F (37 C) 98.2 F (36.8 C) 98.4 F (36.9 C)  TempSrc:      SpO2: 99% 98% 96% 98%  Weight:      Height:       CBC Latest Ref Rng & Units 09/25/2016 09/24/2016 09/22/2016  WBC 4.0 - 10.5 K/uL 7.3 8.6 8.3  Hemoglobin 12.0 - 15.0 g/dL 0.9(W9.7(L) 1.1(B9.4(L) 10.4(L)  Hematocrit 36.0 - 46.0 % 30.9(L) 29.4(L) 31.0(L)  Platelets 150  - 400 K/uL 475(H) 438(H) 317   BMP Latest Ref Rng & Units 09/25/2016 09/24/2016 09/23/2016  Glucose 65 - 99 mg/dL 147(W108(H) 91 95  BUN 6 - 20 mg/dL 5(L) <2(N<5(L) 7  Creatinine 0.44 - 1.00 mg/dL 5.620.63 1.300.62 8.650.69  Sodium 135 - 145 mmol/L 141 141 141  Potassium 3.5 - 5.1 mmol/L 3.2(L) 3.3(L) 3.2(L)  Chloride 101 - 111 mmol/L 108 110 109  CO2 22 - 32 mmol/L 27 23 24   Calcium 8.9 - 10.3 mg/dL 8.2(L) 8.1(L) 8.2(L)   Intake/Output      07/11 0701 - 07/12 0700 07/12 0701 - 07/13 0700   I.V. (mL/kg) 3 (0)    Total Intake(mL/kg) 3 (0)    Urine (mL/kg/hr)  600 (1.1)   Total Output   600   Net +3 -600        Urine Occurrence 12 x      Physical Exam: General: NAD.  Upright in bed. Calm, conversant. No increased work of breathing. MSK  Right Upper extremity: Neurovascularly intact Sensation intact distally Hand warm with moderate expected swelling. Incision: dressing C/D/I  Right Lower Extremity: Knee w/ bogginess over patella and some underlying fluctuance.  Significantly tender to palpation (increased from previous exam). No surrounding erythema / induration. ROM limited by pain 0-90 vs 0-120 on the left. NVI distally  Shannon BilletHenry Calvin Martensen III, PA-C 09/26/2016, 3:13  PM

## 2016-09-27 NOTE — Progress Notes (Signed)
Internal Medicine Attending  Date: 09/27/2016  Patient name: Shannon Dawson Medical record number: [id number] Date of birth: 02/06/1976 Age: 41 y.o. Gender: female  I saw and evaluated the patient. I reviewed the resident's note by Dr. Caron PresumeHelberg and I agree with the resident's findings and plans as documented in his progress note.  When seen on rounds this morning [name] continued to have pain over the right knee given the purulent material aspirated out of the prepatellar bursa. She subsequently underwent a prepatellar bursectomy as well as removal of the underlying hardware. Tomorrow morning we will clarify with orthopedic surgery and ID if there is any additional antibiotic needs given the current status of her cultures. If what she has already been treated with is felt to be appropriate she should be able to be discharged home when cleared by orthopedic surgery from their perspective.

## 2016-09-27 NOTE — Op Note (Signed)
09/20/2016 - 09/27/2016  4:50 PM  PATIENT:  Shannon Dawson    PRE-OPERATIVE DIAGNOSIS:  Bursitis  POST-OPERATIVE DIAGNOSIS:  Same  PROCEDURE:  IRRIGATION AND DEBRIDEMENT PRE-PATELLER BURSA, HARDWARE REMOVAL KNEE  SURGEON:  Brittie Whisnant, Jewel BaizeIMOTHY D, MD  ASSISTANT: Aquilla HackerHenry Martensen, PA-C, he was present and scrubbed throughout the case, critical for completion in a timely fashion, and for retraction, instrumentation, and closure.   ANESTHESIA:   gen  PREOPERATIVE INDICATIONS:  Shannon SnareMary Dawson is a  41 y.o. female with a diagnosis of Bursitis who failed conservative measures and elected for surgical management.    The risks benefits and alternatives were discussed with the patient preoperatively including but not limited to the risks of infection, bleeding, nerve injury, cardiopulmonary complications, the need for revision surgery, among others, and the patient was willing to proceed.  OPERATIVE IMPLANTS: none  OPERATIVE FINDINGS: purulent fluid  BLOOD LOSS: min  COMPLICATIONS: none  TOURNIQUET TIME: 30min  OPERATIVE PROCEDURE:  Patient was identified in the preoperative holding area and site was marked by me She was transported to the operating theater and placed on the table in supine position taking care to pad all bony prominences. After a preincinduction time out anesthesia was induced. The right lower extremity was prepped and draped in normal sterile fashion and a pre-incision timeout was performed. She received ancef for preoperative antibiotics.   I made a longitudinal incision through her previous incision and dissected down to her abscess I debrided this abscess as was an excisional debridement a scissors and Ronjair to remove any necrotic tissue. I then performed a complete prepatellar bursectomy again with a Ronjair.  Medially visible was her hardware from her ORIF patella side elected to remove this as it was likely contaminated. I was able to completely remove screws and wires from  this ORIF.  I thoroughly irrigated her wound after this excisional debridement then performed a complex closure.  Sterile dressings applied she was awoken and taken the PACU in stable condition  POST OPERATIVE PLAN: wbat, continue abx and dvt px per primary team

## 2016-09-27 NOTE — Transfer of Care (Signed)
Immediate Anesthesia Transfer of Care Note  Patient: Shannon Dawson  Procedure(s) Performed: Procedure(s): IRRIGATION AND DEBRIDEMENT PRE-PATELLER BURSA (Right) HARDWARE REMOVAL KNEE (Right)  Patient Location: PACU  Anesthesia Type:General  Level of Consciousness: awake, alert  and oriented  Airway & Oxygen Therapy: Patient Spontanous Breathing and Patient connected to nasal cannula oxygen  Post-op Assessment: Report given to RN, Post -op Vital signs reviewed and stable and Patient moving all extremities X 4  Post vital signs: Reviewed and stable  Last Vitals:  Vitals:   09/27/16 1229 09/27/16 1515  BP: 137/79 139/83  Pulse: (!) 50 60  Resp: 20 13  Temp:  36.4 C    Last Pain:  Vitals:   09/27/16 1237  TempSrc:   PainSc: 5       Patients Stated Pain Goal: 5 (09/27/16 1205)  Complications: No apparent anesthesia complications

## 2016-09-27 NOTE — Anesthesia Postprocedure Evaluation (Signed)
Anesthesia Post Note  Patient: Shannon Dawson  Procedure(s) Performed: Procedure(s) (LRB): TRANSESOPHAGEAL ECHOCARDIOGRAM (TEE) (N/A)     Patient location during evaluation: Endoscopy Anesthesia Type: MAC Level of consciousness: awake and alert Pain management: pain level controlled Vital Signs Assessment: post-procedure vital signs reviewed and stable Respiratory status: spontaneous breathing, nonlabored ventilation, respiratory function stable and patient connected to nasal cannula oxygen Cardiovascular status: stable and blood pressure returned to baseline Anesthetic complications: no    Last Vitals:  Vitals:   09/27/16 0215 09/27/16 0537  BP: 139/85 136/80  Pulse: (!) 57 (!) 55  Resp: 18   Temp: 36.8 C 36.8 C    Last Pain:  Vitals:   09/27/16 0539  TempSrc:   PainSc: 9                  Jhoanna Heyde

## 2016-09-27 NOTE — Progress Notes (Signed)
MD on call paged that patient was crying and that the pain management was not effective. New Orders received patient then stated that the pain was coming like in waves MD paged for muscle relaxer was told MD would come and see patient. Informed that MD is coming to see her. Will continue to monitor. Ilean SkillVeronica Zakira Ressel LPN

## 2016-09-27 NOTE — Anesthesia Procedure Notes (Signed)
Procedure Name: LMA Insertion Date/Time: 09/27/2016 1:54 PM Performed by: Geraldo DockerSOLHEIM, Zhara Gieske SALOMON Pre-anesthesia Checklist: Patient identified, Patient being monitored, Timeout performed, Emergency Drugs available and Suction available Patient Re-evaluated:Patient Re-evaluated prior to induction Oxygen Delivery Method: Circle System Utilized Preoxygenation: Pre-oxygenation with 100% oxygen Induction Type: IV induction Ventilation: Mask ventilation without difficulty LMA: LMA inserted LMA Size: 4.0 Number of attempts: 1 Placement Confirmation: positive ETCO2 and breath sounds checked- equal and bilateral Tube secured with: Tape Dental Injury: Teeth and Oropharynx as per pre-operative assessment

## 2016-09-28 LAB — CBC
HCT: 31.7 % — ABNORMAL LOW (ref 36.0–46.0)
HEMOGLOBIN: 9.9 g/dL — AB (ref 12.0–15.0)
MCH: 28.4 pg (ref 26.0–34.0)
MCHC: 31.2 g/dL (ref 30.0–36.0)
MCV: 91.1 fL (ref 78.0–100.0)
Platelets: 508 10*3/uL — ABNORMAL HIGH (ref 150–400)
RBC: 3.48 MIL/uL — AB (ref 3.87–5.11)
RDW: 17.7 % — ABNORMAL HIGH (ref 11.5–15.5)
WBC: 11 10*3/uL — ABNORMAL HIGH (ref 4.0–10.5)

## 2016-09-28 LAB — BASIC METABOLIC PANEL
ANION GAP: 8 (ref 5–15)
BUN: 13 mg/dL (ref 6–20)
CALCIUM: 9 mg/dL (ref 8.9–10.3)
CHLORIDE: 101 mmol/L (ref 101–111)
CO2: 26 mmol/L (ref 22–32)
CREATININE: 0.69 mg/dL (ref 0.44–1.00)
GFR calc non Af Amer: >60 mL/min (ref >60)
GLUCOSE: 180 mg/dL — AB (ref 65–99)
Potassium: 4.2 mmol/L (ref 3.5–5.1)
Sodium: 135 mmol/L (ref 135–145)

## 2016-09-28 LAB — CULTURE, BLOOD (ROUTINE X 2)
CULTURE: NO GROWTH
Culture: NO GROWTH
SPECIAL REQUESTS: ADEQUATE
Special Requests: ADEQUATE

## 2016-09-28 NOTE — Evaluation (Signed)
Physical Therapy Evaluation Patient Details Name: Shannon SnareMary Goda MRN: [id number] DOB: 06-06-1975 Today's Date: 09/28/2016   History of Present Illness  41 y.o female who reported to the ED after falling down and embankment and hitting her R knee.  Pt reports R knee and R shoulder pain from fall.  Upon further evaluation: Fungemia, R knee cellulitis, R forearm cellulitis (from admitted recent IV drug use), L forearm abcess ( now s/p I/D), and hypokalemia now S/P I&D right Knee.  PMHx significant for IV drug use, anemia, anxiety, chronic low back pain, chronic R knee pain, depression, alcohol abuse and kidney stones.   Clinical Impression  Pt presents with above diagnosis and below deficits for therapy evaluation. Pt was recently discharged from acute therapy prior to recent surgery as she had become Independent with mobility. Pt now presents with increased pain at 8/10 at rest and 10/10 with mobility and is unable to ambulate greater than 20' before having increased pain and needing to return to bed. Pt is unsure of her DC location at this time, but will still require crutches for ambulation. Pt will benefit from continued acute PT services in order to address the below deficits prior to DC.     Follow Up Recommendations No PT follow up    Equipment Recommendations  Crutches    Recommendations for Other Services       Precautions / Restrictions Precautions Precautions: Fall Restrictions Weight Bearing Restrictions: Yes RLE Weight Bearing: Weight bearing as tolerated      Mobility  Bed Mobility Overal bed mobility: Modified Independent Bed Mobility: Supine to Sit;Sit to Supine     Supine to sit: Modified independent (Device/Increase time) Sit to supine: Modified independent (Device/Increase time)   General bed mobility comments: Mod I  Transfers Overall transfer level: Needs assistance Equipment used: Crutches Transfers: Sit to/from Stand Sit to Stand: Min guard          General transfer comment: Min guard for safety from EOB to crutches  Ambulation/Gait Ambulation/Gait assistance: Min guard Ambulation Distance (Feet): 20 Feet Assistive device: Crutches Gait Pattern/deviations: Step-to pattern;Decreased step length - left;Decreased stance time - right;Antalgic Gait velocity: decreased Gait velocity interpretation: Below normal speed for age/gender General Gait Details: moderate antalgic gait with increased c/o pain and crying throughout gait. very short distance due to pain  Stairs            Wheelchair Mobility    Modified Rankin (Stroke Patients Only)       Balance Overall balance assessment: Needs assistance Sitting-balance support: No upper extremity supported;Feet supported Sitting balance-Leahy Scale: Normal     Standing balance support: Bilateral upper extremity supported;Single extremity supported Standing balance-Leahy Scale: Fair Standing balance comment: able to stand with 1 hand on railing                             Pertinent Vitals/Pain Pain Assessment: 0-10 Pain Score: 8  Pain Location: right knee  Pain Descriptors / Indicators: Burning;Penetrating;Sharp Pain Intervention(s): Premedicated before session;Limited activity within patient's tolerance;Monitored during session;Patient requesting pain meds-RN notified;Repositioned;Ice applied    Home Living Family/patient expects to be discharged to:: Unsure   Available Help at Discharge: Other (Comment) (unsure)             Additional Comments: Pt reporting that she lives with a friend PTA and won't be able to return to that prior home. Pt stating she doesnt know where she will dc to at  this point. Pt reports that she has no DME or AE.    Prior Function Level of Independence: Independent               Hand Dominance   Dominant Hand: Right    Extremity/Trunk Assessment   Upper Extremity Assessment Upper Extremity Assessment: Overall WFL for  tasks assessed RUE Deficits / Details: Cellulitis of R forearm. Pt with WFL of shoulder, elbow, wrist, and hand    Lower Extremity Assessment Lower Extremity Assessment: RLE deficits/detail RLE Deficits / Details: Cellulitis of R knee. Pt reporting significant pain, R hip and knee strength grossly 3+/5, R ankle 4/5, R knee ROM lacking approximately 10 degrees of extension and 20 degrees of flexion RLE: Unable to fully assess due to pain LLE Deficits / Details: ROM WFL, strength grossly 4/5    Cervical / Trunk Assessment Cervical / Trunk Assessment: Normal  Communication   Communication: No difficulties  Cognition Arousal/Alertness: Awake/alert Behavior During Therapy: WFL for tasks assessed/performed Overall Cognitive Status: Within Functional Limits for tasks assessed                                        General Comments      Exercises     Assessment/Plan    PT Assessment Patient needs continued PT services  PT Problem List Decreased strength;Decreased range of motion;Decreased activity tolerance;Decreased mobility;Decreased knowledge of use of DME;Pain       PT Treatment Interventions DME instruction;Gait training;Stair training;Functional mobility training;Therapeutic activities;Therapeutic exercise;Patient/family education    PT Goals (Current goals can be found in the Care Plan section)  Acute Rehab PT Goals Patient Stated Goal: to have less pain PT Goal Formulation: With patient Time For Goal Achievement: 10/05/16 Potential to Achieve Goals: Good    Frequency Min 3X/week   Barriers to discharge Decreased caregiver support unsure of discharge location or support    Co-evaluation               AM-PAC PT "6 Clicks" Daily Activity  Outcome Measure Difficulty turning over in bed (including adjusting bedclothes, sheets and blankets)?: None Difficulty moving from lying on back to sitting on the side of the bed? : None Difficulty sitting  down on and standing up from a chair with arms (e.g., wheelchair, bedside commode, etc,.)?: Total Help needed moving to and from a bed to chair (including a wheelchair)?: A Lot Help needed walking in hospital room?: A Lot Help needed climbing 3-5 steps with a railing? : Total 6 Click Score: 14    End of Session Equipment Utilized During Treatment: Gait belt Activity Tolerance: Patient limited by pain Patient left: in bed;with call bell/phone within reach Nurse Communication: Mobility status;Patient requests pain meds PT Visit Diagnosis: Other abnormalities of gait and mobility (R26.89);Pain;Difficulty in walking, not elsewhere classified (R26.2) Pain - Right/Left: Right Pain - part of body: Knee    Time: 1610-9604 PT Time Calculation (min) (ACUTE ONLY): 24 min   Charges:   PT Evaluation $PT Eval Low Complexity: 1 Procedure PT Treatments $Therapeutic Activity: 8-22 mins   PT G Codes:        Colin Broach PT, DPT  [phone]   Roxy Manns 09/28/2016, 3:39 PM

## 2016-09-28 NOTE — Progress Notes (Signed)
Subjective: 4 Day Post-Op I&D Right Forearm 1 Day Post-Op Procedure(s) (LRB): IRRIGATION AND DEBRIDEMENT PRE-PATELLER BURSA (Right) HARDWARE REMOVAL KNEE (Right) Patient resting comfortably.  Objective: Vital signs in last 24 hours: Temp:  [97.6 F (36.4 C)-98.5 F (36.9 C)] 98.4 F (36.9 C) (07/14 0529) Pulse Rate:  [50-71] 51 (07/14 0529) Resp:  [12-20] 18 (07/14 0529) BP: (110-139)/(46-87) 128/76 (07/14 0529) SpO2:  [92 %-99 %] 95 % (07/14 0529)  Intake/Output from previous day: 07/13 0701 - 07/14 0700 In: 820 [P.O.:240; I.V.:580] Out: 2550 [Urine:2500; Blood:50] Intake/Output this shift: Total I/O In: 440 [P.O.:440] Out: -    Recent Labs  09/27/16 0735 09/28/16 0453  HGB 10.4* 9.9*    Recent Labs  09/27/16 0735 09/28/16 0453  WBC 8.3 11.0*  RBC 3.57* 3.48*  HCT 32.5* 31.7*  PLT 494* 508*    Recent Labs  09/27/16 0735 09/28/16 0453  NA 135 135  K 3.5 4.2  CL 103 101  CO2 26 26  BUN 5* 13  CREATININE 0.66 0.69  GLUCOSE 86 180*  CALCIUM 9.0 9.0   No results for input(s): LABPT, INR in the last 72 hours.  Incision: dressing C/D/I  Assessment/Plan: 4 Day Post-Op I&D Right Forearm 1 Day Post-Op Procedure(s) (LRB): IRRIGATION AND DEBRIDEMENT PRE-PATELLER BURSA (Right) HARDWARE REMOVAL KNEE (Right) Up with therapy  WBAT RLE/RUE Dsg change per order Suture removal per order forearm 7/23, knee 7/27 Abx per ID D/c per primary team Ortho will sign off f/u 1 week  Pelham Hennick 09/28/2016, 8:33 AM

## 2016-09-28 NOTE — Progress Notes (Signed)
Patient ID: Servando SnareMary Mcwatters, female   DOB: [birth year], 41 y.o.   MRN: [id number]          [facility]    Date of Admission:  09/20/2016    Total days of antibiotics 8        Day 8 antifungal therapy        Oritavancin dosed 09/25/2016         Ms. Haynes BastKessler has widely disseminated polymicrobial infection with bilateral arm abscesses right prepatellar bursitis, bacteremia and right prepatellar bursitis. She has been treated for group A strep, MRSA and candidemia. The operative Gram stain of her bursal fluid was reported to show gram-positive cocci and rare gram-negative rods. I did not expect gram-negative rods and had the Gram stain reread. That is a false positive reading. No gram-negative rods were actually seen. The oritavancin will remain therapeutic for 10-14 more days which should suffice for her group A strep and MRSA infection. She will continue voriconazole through 10/06/2016. I will sign off now.  Cliffton AstersJohn Jessi Pitstick, MD [facility] [phone] pager   [phone] 09/28/2016, 1:13 PM

## 2016-09-28 NOTE — Progress Notes (Signed)
   Subjective: Resting comfortably this AM. Acute pain overnight. No other questions or concerns. Discussed discharge today pending PT evaluation.   Objective: Vital signs in last 24 hours: Vitals:   09/27/16 1653 09/27/16 1829 09/27/16 2112 09/28/16 0529  BP: (!) 116/46 111/72 110/69 128/76  Pulse: 64 (!) 54 71 (!) 51  Resp: 16 16 18 18   Temp: 98.5 F (36.9 C) 97.7 F (36.5 C) 98 F (36.7 C) 98.4 F (36.9 C)  TempSrc:  Oral Oral Oral  SpO2: 92% 99% 94% 95%  Weight:      Height:       Physical Exam  Constitutional: She is oriented to person, place, and time. She appears well-developed and well-nourished.  HENT:  Head: Normocephalic and atraumatic.  Eyes: Pupils are equal, round, and reactive to light. Conjunctivae are normal.  Cardiovascular: Normal rate, regular rhythm, normal heart sounds and intact distal pulses.  Exam reveals no friction rub.   No murmur heard. Pulmonary/Chest: Effort normal and breath sounds normal. She has no wheezes. She has no rales.  Abdominal: Soft. Bowel sounds are normal. She exhibits no distension. There is no tenderness. There is no guarding.  Musculoskeletal:  Bandage on the right forearm and right knee are dry and clean. Erythema of the left dorso-lateral foot has resolved. Left forearm bandage has no erythema surrounding it.   Neurological: She is alert and oriented to person, place, and time.  Skin: Skin is warm and dry.  Psychiatric: She has a normal mood and affect. Her behavior is normal. Judgment and thought content normal.   Assessment/Plan:  Principal Problem:   Fungemia Active Problems:   Cellulitis   Cellulitis of right arm   Cellulitis of right knee   Abscess of left forearm   Hypokalemia   Fall   Acute pain of right shoulder   Bacteremia   Abscess of forearm, right   Prepatellar bursitis, right knee  1. Fungemia and Bacteremia. 2/2 Strep pyogenes and Candida krusei  - S/p right pre-patellar bursectomy with hardware  removal  - Blood cultures on 09/22/2016 and 07/09/2018have shown NGTD - ID consulted. Appreciate the recommendations; received IV oritavancin on 09/26/2016. Started PO voriconazole on 09/27/2016. Will discharge to get prescription, continue to take until 10/06/2016   2. Asymptomatic Bacteriuria. Enterobacter aerogenes - Currently not treating   3. IV Drug Abuse. Heroin  - HIV and Hepatitis B negative/non-reactive  - Hepatitis C quant not detectable, prior positive reaction on 08/29/2011 - Expresses desire to quit. Outpatient methadone. Will have her follow-up with [facility] or [facility]   4. Right shoulder pain. Improved  5. Right Prepatellar Bursitis. - Bedside ultrasound illustrated fluid with possible debris  - Aspiration on 09/26/2016, 4mL fluid removed sent for cultures. Gram stain illustrated Gram positive cocci in pairs.  - S/p right pre-patellar bursectomy with hardware removal - PT/OT evaluation (imminent discharge)  - Outpatient pain management: Percocet 5/325 q6hr PRN for 6 days and Ibuprofen 600 mg q6hr PRN   Dispo: Anticipated discharge in approximately 1-2 day(s).   Levora DredgeHelberg, Samar Dass, MD 09/28/2016, 7:40 AM My Pager: [phone]

## 2016-09-29 ENCOUNTER — Encounter: Payer: Self-pay | Admitting: Internal Medicine

## 2016-09-29 LAB — AEROBIC/ANAEROBIC CULTURE (SURGICAL/DEEP WOUND)

## 2016-09-29 LAB — AEROBIC/ANAEROBIC CULTURE W GRAM STAIN (SURGICAL/DEEP WOUND): Culture: NO GROWTH

## 2016-09-29 MED ORDER — VORICONAZOLE 200 MG PO TABS: 200.0000 mg | ORAL_TABLET | Freq: Two times a day (BID) | ORAL | 0 refills | Status: AC

## 2016-09-29 MED ORDER — IBUPROFEN 800 MG PO TABS: 800.0000 mg | ORAL_TABLET | Freq: Three times a day (TID) | ORAL | 0 refills | Status: AC | PRN

## 2016-09-29 MED ORDER — OXYCODONE-ACETAMINOPHEN 5-325 MG PO TABS: 1.0000 | ORAL_TABLET | Freq: Four times a day (QID) | ORAL | 0 refills | Status: AC | PRN

## 2016-09-29 NOTE — Evaluation (Signed)
Occupational Therapy Evaluation Patient Details Name: Shannon Dawson MRN: [id number] DOB: 02/28/1976 Today's Date: 09/29/2016    History of Present Illness 41 y.o female who reported to the ED after falling down and embankment and hitting her R knee.  Pt reports R knee and R shoulder pain from fall.  Upon further evaluation: Fungemia, R knee cellulitis, R forearm cellulitis (from admitted recent IV drug use), L forearm abcess ( now s/p I/D), and hypokalemia now S/P I&D right Knee.  PMHx significant for IV drug use, anemia, anxiety, chronic low back pain, chronic R knee pain, depression, alcohol abuse and kidney stones.    Clinical Impression   PTA, pt was independent with ADLs and IADLs. Provided education on LB dressing, sling management, and fall prevention; pt demonstrated good understanding. Pt performed ADLs and functional mobility at [employer] Level with crutches. Answered all pt's questions in preparation for dc later today. Recommend dc once medically stable per physician. All acute OT needs met. Thank you!    Follow Up Recommendations  No OT follow up;Supervision - Intermittent    Equipment Recommendations  None recommended by OT    Recommendations for Other Services PT consult     Precautions / Restrictions Precautions Precautions: None Restrictions Weight Bearing Restrictions: Yes RLE Weight Bearing: Weight bearing as tolerated      Mobility Bed Mobility Overal bed mobility: Modified Independent             General bed mobility comments: Increased time  Transfers Overall transfer level: Modified independent Equipment used: Crutches             General transfer comment: Increased time    Balance Overall balance assessment: Needs assistance Sitting-balance support: No upper extremity supported;Feet supported Sitting balance-Leahy Scale: Normal     Standing balance support: Single extremity supported;During functional activity Standing balance-Leahy  Scale: Fair Standing balance comment: Able to perform grooming at sink with single hand on sink counter                           ADL either performed or assessed with clinical judgement   ADL Overall ADL's : Needs assistance/impaired     Grooming: Oral care;Supervision/safety;Standing Grooming Details (indicate cue type and reason): Pt performed oral care at sink with supervision for safety             Lower Body Dressing: Min guard;Cueing for sequencing;Sit to/from stand Lower Body Dressing Details (indicate cue type and reason): Provided education on LB dressing techniques to decrease pain and optimize safety. Pt donned underwear with Min guard A             Functional mobility during ADLs: Min guard (crutches) General ADL Comments: Pt performing ADLs and functional mobility at supervision-Min guard level. Provided education on LB ADLs, sling management for comfort, and fall prevention.      Vision         Perception     Praxis      Pertinent Vitals/Pain Pain Assessment: Faces Pain Score: 7  Faces Pain Scale: Hurts little more Pain Location: right knee  Pain Descriptors / Indicators: Burning;Constant;Grimacing;Sharp;Sore Pain Intervention(s): Monitored during session;Repositioned     Hand Dominance Right   Extremity/Trunk Assessment Upper Extremity Assessment Upper Extremity Assessment: Overall WFL for tasks assessed RUE Deficits / Details: Cellulitis of R forearm. Pt with WFL of shoulder, elbow, wrist, and hand   Lower Extremity Assessment Lower Extremity Assessment: Defer to PT evaluation;RLE deficits/detail  RLE Deficits / Details: Cellulitis of R knee. Pt reporting significant pain, R hip and knee strength grossly 3+/5, R ankle 4/5, R knee ROM lacking approximately 10 degrees of extension and 20 degrees of flexion   Cervical / Trunk Assessment Cervical / Trunk Assessment: Normal   Communication Communication Communication: No  difficulties   Cognition Arousal/Alertness: Awake/alert Behavior During Therapy: WFL for tasks assessed/performed;Flat affect Overall Cognitive Status: Within Functional Limits for tasks assessed                                     General Comments  Contacted [employer] to deliver crutches - order in chart    Exercises     Shoulder Instructions      Home Living Family/patient expects to be discharged to:: Private residence Living Arrangements: Parent;Other (Comment) (dc to father's home) Available Help at Discharge: Family;Available PRN/intermittently Type of Home: House Home Access: Stairs to enter [employer] of Steps: 3         Bathroom Shower/Tub: Teacher, early years/pre: Standard                Prior Functioning/Environment Level of Independence: Independent        Comments: Independent PTA. Pt s/p I&D right Knee since last eval        OT Problem List: Decreased activity tolerance;Decreased safety awareness;Decreased knowledge of use of DME or AE;Pain      OT Treatment/Interventions:      OT Goals(Current goals can be found in the care plan section) Acute Rehab OT Goals Patient Stated Goal: to have less pain OT Goal Formulation: With patient Time For Goal Achievement: 10/13/16 Potential to Achieve Goals: Good  OT Frequency:     Barriers to D/C:            Co-evaluation              AM-PAC PT "6 Clicks" Daily Activity     Outcome Measure Help from another person eating meals?: None Help from another person taking care of personal grooming?: None Help from another person toileting, which includes using toliet, bedpan, or urinal?: A Little Help from another person bathing (including washing, rinsing, drying)?: A Little Help from another person to put on and taking off regular upper body clothing?: None Help from another person to put on and taking off regular lower body clothing?: A Little 6 Click  Score: 21   End of Session Equipment Utilized During Treatment: Other (comment) (Crutches) Nurse Communication: Mobility status  Activity Tolerance: Patient tolerated treatment well Patient left: in bed;with call bell/phone within reach (At EOB to eat lunch)  OT Visit Diagnosis: Other abnormalities of gait and mobility (R26.89);Pain Pain - Right/Left: Right Pain - part of body: Knee                Time: 8032-1224 OT Time Calculation (min): 22 min Charges:  OT General Charges $OT Visit: 1 Procedure OT Evaluation $OT Eval Low Complexity: 1 Procedure G-Codes:     Terryn Rosenkranz MSOT, OTR/L Acute Rehab Pager: [phone] Office: [place] 09/29/2016, 2:44 PM

## 2016-09-29 NOTE — Progress Notes (Signed)
Nsg Discharge Note  Admit Date:  09/20/2016 Discharge date: 09/29/2016   Servando SnareMary Corkern to be D/C'd Home per MD order.  AVS completed.  Copy for chart, and copy for patient signed, and dated. Patient/caregiver able to verbalize understanding.  Discharge Medication: Allergies as of 09/29/2016      Reactions   Tramadol Hives, Itching   Severe   Vicodin [hydrocodone-acetaminophen] Itching   Itching is severe.      Medication List    TAKE these medications   acetaminophen 500 MG tablet Commonly known as:  TYLENOL Take 1 tablet (500 mg total) by mouth every 6 (six) hours as needed (pain).   diphenhydrAMINE 25 mg capsule Commonly known as:  BENADRYL Take 1 capsule (25 mg total) by mouth every 6 (six) hours as needed for itching.   ibuprofen 400 MG tablet Commonly known as:  ADVIL,MOTRIN Take 1 tablet (400 mg total) by mouth every 6 (six) hours as needed for moderate pain. What changed:  medication strength  how much to take   ibuprofen 800 MG tablet Commonly known as:  ADVIL,MOTRIN Take 1 tablet (800 mg total) by mouth every 8 (eight) hours as needed. What changed:  medication strength  how much to take  when to take this   oxyCODONE-acetaminophen 5-325 MG tablet Commonly known as:  PERCOCET/ROXICET Take 1-2 tablets by mouth every 6 (six) hours as needed for severe pain. What changed:  Another medication with the same name was added. Make sure you understand how and when to take each.   oxyCODONE-acetaminophen 5-325 MG tablet Commonly known as:  ROXICET Take 1 tablet by mouth every 6 (six) hours as needed. What changed:  You were already taking a medication with the same name, and this prescription was added. Make sure you understand how and when to take each.   voriconazole 200 MG tablet Commonly known as:  VFEND Take 1 tablet (200 mg total) by mouth 2 (two) times daily.   voriconazole 200 MG tablet Commonly known as:  VFEND Take 1 tablet (200 mg total) by mouth  every 12 (twelve) hours.            Durable Medical Equipment        Start     Ordered   09/22/16 1528  For home use only DME Crutches  Once     09/22/16 1527      Discharge Assessment: Vitals:   09/28/16 2154 09/29/16 0537  BP: 110/70 125/75  Pulse: (!) 57 (!) 55  Resp: 18 18  Temp: 98.4 F (36.9 C) 97.6 F (36.4 C)   Skin clean, dry and intact without evidence of skin break down, no evidence of skin tears noted. IV catheter discontinued intact. Site without signs and symptoms of complications - no redness or edema noted at insertion site, patient denies c/o pain - only slight tenderness at site.  Dressing with slight pressure applied.  D/c Instructions-Education: Discharge instructions given to patient/family with verbalized understanding. D/c education completed with patient/family including follow up instructions, medication list, d/c activities limitations if indicated, with other d/c instructions as indicated by MD - patient able to verbalize understanding, all questions fully answered. Patient instructed to return to ED, call 911, or call MD for any changes in condition.  Patient escorted via WC, and D/C home via private auto.  Camillo FlamingVicki L Kasheem Toner, RN 09/29/2016 4:07 PM

## 2016-09-29 NOTE — Discharge Instructions (Signed)
Please pick-up and take your Voriconazole from the [facility]. Follow the instructions on the pill bottle.   Follow-up at [facility] or [facility] to begin Methadone therapy.   Please Follow-up with [facility]. You will need repeat labs in 5-6 weeks.   Please follow-up with Orthopedics.   Maintain clean dressings on Right forearm and knee.  Sutures should come out on or around 10/07/16 forearm and 10/11/16 knee.  WBAT Right Arm and Leg   Bacteremia Bacteremia is the presence of bacteria in the blood. When bacteria enter the bloodstream, they can cause a life-threatening reaction called sepsis, which is a medical emergency. Bacteremia can spread to other parts of the body, including the heart, joints, and brain. What are the causes? This condition is caused by bacteria that get into the blood. Bacteria can enter the blood:  From a skin infection or a cut on your skin.  During an episode of pneumonia.  From an infection in your stomach or intestine (gastrointestinal infection).  From an infection in your bladder or urinary system (urinary tract infection).  During a dental or medical procedure.  After you brush your teeth so hard that your gums bleed.  When a bacterial infection in another part of the body spreads to the blood.  Through a dirty needle.  What increases the risk? This condition is more likely to develop in:  Children.  Elderly adults.  People who have a long-lasting (chronic) disease or medical condition.  People who have an artificial joint or heart valve.  People who have heart valve disease.  People who have a tube, such as a catheter or IV tube, that has been inserted for a medical treatment.  People who have a weak body defense system (immune system).  People who use IV drugs.  What are the signs or symptoms? Symptoms of this condition include:  Fever.  Chills.  A racing heart.  Shortness of  breath.  Dizziness.  Weakness.  Confusion.  Nausea or vomiting.  Diarrhea.  In some cases, there are no symptoms. Bacteremia that has spread to the other parts of the body may cause symptoms in those areas. How is this diagnosed? This condition may be diagnosed with a physical exam and tests, such as:  A complete blood count (CBC). This test looks for signs of infection.  Blood cultures. These look for bacteria in your blood.  Tests of any tubes that you may have inserted into your body, such as an IV tube or urinary catheter. These tests look for a source of infection.  Urine tests including urine cultures. These look for bacteria in the urine that could be a source of infection.  Imaging tests, such as an X-ray, CT scan, MRI, or heart ultrasound. These look for a source of infection in other parts of the body, such as the lungs, heart valves, or joints.  How is this treated? This condition may be treated with:  Antibiotic medicines given through an IV infusion. Depending on the source of infection, antibiotics may be needed for several weeks. At first, an antibiotic may be given to kill most types of blood bacteria. If your test results show that a certain kind of bacteria is causing problems, the antibiotic may be changed to kill only the bacteria that are causing problems.  Antibiotics taken by mouth.  IV fluids to support the body as you fight the infection.  Removing any catheter or device that could be a source of infection.  Blood pressure and breathing support, if you have sepsis.  Surgery to control the source or spread of infection, such as: ? Removing an infected implanted device. ? Removing infected tissue or an abscess.  This condition is usually treated at a hospital. If you are treated at home, you may need to come back for medicines, blood tests, and evaluation. This is important. Follow these instructions at home:  Take over-the-counter and prescription  medicines only as told by your health care provider.  If you were prescribed an antibiotic, take it as told by your health care provider. Do not stop taking the antibiotic even if you start to feel better.  Rest until your condition is under control.  Drink enough fluid to keep your urine clear or pale yellow.  Do not smoke. If you need help quitting, ask your health care provider.  Keep all follow-up visits as told by your health care provider. This is important. How is this prevented?  Get the vaccinations that your health care provider recommends.  Clean and cover any scrapes or cuts.  Take good care of your skin. This includes regular bathing and moisturizing.  Wash your hands often.  Practice good oral hygiene. Brush your teeth two times a day and floss regularly. Get help right away if:  You have pain.  You have a fever.  You have trouble breathing.  Your skin becomes blotchy, pale, or clammy.  You develop confusion, dizziness, or weakness.  You develop diarrhea.  You develop any new symptoms after treatment. Summary  Bacteremia is the presence of bacteria in the blood. When bacteria enter the bloodstream, they can cause a life- threatening reaction called sepsis.  Children and elderly adults are at increased risk of bacteremia. Other risk factors include having a long-lasting (chronic) disease or a weak immune system, having an artificial joint or heart valve, having heart valve disease, having tubes that were inserted in the body for medical treatment, or using IV drugs.  Some symptoms of bacteremia include fever, chills, shortness of breath, confusion, nausea or vomiting, and diarrhea.  Tests may be done to diagnose a source of infection that led to bacteremia. These tests may include blood tests, urine tests, and imaging tests.  Bacteremia is usually treated with antibiotics, usually in a hospital. This information is not intended to replace advice given to  you by your health care provider. Make sure you discuss any questions you have with your health care provider. Document Released: 12/16/2005 Document Revised: 01/30/2016 Document Reviewed: 01/30/2016 Elsevier Interactive Patient Education  [employer]2018 [employer]

## 2016-09-29 NOTE — Progress Notes (Signed)
Physical Therapy Treatment Patient Details Name: Shannon Dawson MRN: [id number] DOB: Nov 29, 1975 Today's Date: 09/29/2016    History of Present Illness 41 y.o female who reported to the ED after falling down and embankment and hitting her R knee.  Pt reports R knee and R shoulder pain from fall.  Upon further evaluation: Fungemia, R knee cellulitis, R forearm cellulitis (from admitted recent IV drug use), L forearm abcess ( now s/p I/D), and hypokalemia now S/P I&D right Knee.  PMHx significant for IV drug use, anemia, anxiety, chronic low back pain, chronic R knee pain, depression, alcohol abuse and kidney stones.     PT Comments    Patient making good progress toward goals.  Patient functioning at Mod I level with mobility and gait with crutches.  Patient reports she has used crutches in the past and know how to do stairs.  Contacted [employer] to deliver crutches.  Patient to d/c today.    Follow Up Recommendations  No PT follow up     Equipment Recommendations  Crutches (To be delivered this am by ortho tech)    Recommendations for Other Services       Precautions / Restrictions Precautions Precautions: None Restrictions Weight Bearing Restrictions: Yes RLE Weight Bearing: Weight bearing as tolerated    Mobility  Bed Mobility Overal bed mobility: Modified Independent                Transfers Overall transfer level: Modified independent Equipment used: Crutches             General transfer comment: Patient able to stand and move crutches into proper place.    Ambulation/Gait Ambulation/Gait assistance: Modified independent (Device/Increase time) Ambulation Distance (Feet): 30 Feet Assistive device: Crutches Gait Pattern/deviations: Step-to pattern;Decreased step length - left;Decreased stance time - right;Antalgic Gait velocity: decreased Gait velocity interpretation: Below normal speed for age/gender General Gait Details: moderate antalgic gait with  increased c/o pain and crying throughout gait. very short distance due to pain   Stairs Stairs:  (Declined stair training. She has used crutches on stairs)          Wheelchair Mobility    Modified Rankin (Stroke Patients Only)       Balance             Standing balance-Leahy Scale: Fair                              Cognition Arousal/Alertness: Awake/alert Behavior During Therapy: WFL for tasks assessed/performed;Flat affect Overall Cognitive Status: Within Functional Limits for tasks assessed                                        Exercises      General Comments General comments (skin integrity, edema, etc.): Contacted [employer] to deliver crutches - order in chart      Pertinent Vitals/Pain Pain Assessment: 0-10 Pain Score: 7  Pain Location: right knee  Pain Descriptors / Indicators: Burning;Constant;Grimacing;Sharp;Sore Pain Intervention(s): Limited activity within patient's tolerance;Monitored during session;Repositioned    Home Living                      Prior Function            PT Goals (current goals can now be found in the care plan section) Acute Rehab PT Goals Patient  Stated Goal: to have less pain Progress towards PT goals: Progressing toward goals    Frequency    Min 3X/week      PT Plan Current plan remains appropriate    Co-evaluation              AM-PAC PT "6 Clicks" Daily Activity  Outcome Measure  Difficulty turning over in bed (including adjusting bedclothes, sheets and blankets)?: None Difficulty moving from lying on back to sitting on the side of the bed? : None Difficulty sitting down on and standing up from a chair with arms (e.g., wheelchair, bedside commode, etc,.)?: None Help needed moving to and from a bed to chair (including a wheelchair)?: None Help needed walking in hospital room?: None Help needed climbing 3-5 steps with a railing? : A Little 6 Click Score:  23    End of Session Equipment Utilized During Treatment: Gait belt Activity Tolerance: Patient limited by pain Patient left: in bed;with call bell/phone within reach Nurse Communication: Mobility status PT Visit Diagnosis: Other abnormalities of gait and mobility (R26.89);Pain;Difficulty in walking, not elsewhere classified (R26.2) Pain - Right/Left: Right Pain - part of body: Knee     Time: 1047-1101 PT Time Calculation (min) (ACUTE ONLY): 14 min  Charges:  $Gait Training: 8-22 mins                    G Codes:       Durenda HurtSusan H. Renaldo Fiddleravis, PT, [facility] HospitalM[facility] Acute Rehab Services Pager [phone]    Vena AustriaSusan H Brenae Lasecki 09/29/2016, 11:12 AM

## 2016-09-29 NOTE — Progress Notes (Addendum)
Patient verbalized receiving homeless resources, per notes patient received bus pass for DC, patient will get 1 day's worth of antifungal to take home, [facility]. Patient showed CM her MATCH letter from this week still valid till 10/04/16. Crutches at bedside.  Chip BoerVicki RN given 1 day supply of antifungal and additional bus pass (pt states she does not have one) for discharge today.

## 2016-09-29 NOTE — Progress Notes (Signed)
Orthopedic Tech Progress Note Patient Details:  [name] Jan 05, 1976 [id number]  Ortho Devices Type of Ortho Device: Crutches Ortho Device/Splint Location: applied sling/shoulder immobilizer to pt right arm.  pt tolerated application very well.  however pt stated she prefer to move arm to help with pain.  Right Arm.  Ortho Device/Splint Interventions: Application   Saul FordyceJennifer C Trellis Vanoverbeke 09/29/2016, 11:50 AM

## 2016-09-29 NOTE — Progress Notes (Signed)
   Subjective: Shannon DandyMary is doing well this AM. Still in acute pain overnight. Agreeable to going home. Understands that she will need to pick up her voriconazole from [facility] tomorrow. Discussed outpatient follow-up with orthopedics, repeat BC in 4-6 weeks, and going to [facility] or [facility] to initiate methadone therapy. Denies fever, chills, N/V, diarrhea, chest pain overnight.   Objective: Vital signs in last 24 hours: Vitals:   09/28/16 0529 09/28/16 1347 09/28/16 2154 09/29/16 0537  BP: 128/76 116/75 110/70 125/75  Pulse: (!) 51 60 (!) 57 (!) 55  Resp: 18 20 18 18   Temp: 98.4 F (36.9 C) 98.3 F (36.8 C) 98.4 F (36.9 C) 97.6 F (36.4 C)  TempSrc: Oral     SpO2: 95% 97% 97% 96%  Weight:      Height:       Physical Exam  Constitutional: She is oriented to person, place, and time. She appears well-developed and well-nourished.  HENT:  Head: Normocephalic and atraumatic.  Eyes: Pupils are equal, round, and reactive to light. Conjunctivae are normal.  Cardiovascular: Normal rate, regular rhythm, normal heart sounds and intact distal pulses.  Exam reveals no friction rub.   No murmur heard. Pulmonary/Chest: Effort normal and breath sounds normal. She has no wheezes. She has no rales.  Abdominal: Soft. Bowel sounds are normal. There is no tenderness.  Musculoskeletal: She exhibits no edema.  No pain in the right hand or right foot. Distal pulses intact. Gross motor function intact in the right hand and right foot.   Neurological: She is alert and oriented to person, place, and time.  Skin:  Right forearm and right knee bandaged, bandages are dry and clean. No erythema of the left dorso-lateral foot. Incision proximal to the left radial styloid from prior I&D healing well with no surround erythema or drainage.    Assessment/Plan:  Principal Problem:   Fungemia Active Problems:   Cellulitis of right arm   Cellulitis of right knee   Abscess of left forearm   Acute pain of right  shoulder   Bacteremia   Abscess of forearm, right   Prepatellar bursitis, right knee  1. Fungemia and Bacteremia. 2/2 Strep pyogenes and Candida krusei  - Blood cultures on 09/22/2016 and 07/09/2018have shown NGTD - ID consulted. Appreciate the recommendations; received IV oritavancin on 09/26/2016. Started PO voriconazole on 09/27/2016. Will discharge to get prescription, continue to take until 10/06/2016. Given doses for today (09/29/2016) and tomorrow (09/30/2016) AM dose and understands that she will need to pick up rest of prescription tomorrow at [facility].   2. Asymptomatic Bacteriuria. Enterobacter aerogenes - Currently not treating   3. IV Drug Abuse. Heroin  - HIV and Hepatitis B negative/non-reactive  - Hepatitis C quant not detectable, prior positive reaction on 08/29/2011 - Given addresses and necessary information to follow-up with [facility] or [facility] to begin methadone therapy   4. Right shoulder pain. Improved  5. Right Prepatellar Bursitis. - Bedside ultrasound illustrated fluid with possible debris  - Aspiration on 09/26/2016, 4mL fluid removed sent for cultures. Gram stain illustrated Gram positive cocci in pairs.  - S/p right pre-patellar bursectomy with hardware removal - Stent home with crutches - Outpatient pain management: Percocet 5/325 q6hr PRN for 5 days and Ibuprofen 800 mg q8hr PRN   Dispo: Anticipated discharge today   Levora DredgeHelberg, Clarinda Obi, MD 09/29/2016, 11:12 AM My Pager: [phone]

## 2016-09-30 ENCOUNTER — Encounter (HOSPITAL_COMMUNITY): Payer: Self-pay | Admitting: Orthopedic Surgery

## 2016-09-30 NOTE — Anesthesia Postprocedure Evaluation (Signed)
Anesthesia Post Note  Patient: Shannon SnareMary Macfadden  Procedure(s) Performed: Procedure(s) (LRB): IRRIGATION AND DEBRIDEMENT PRE-PATELLER BURSA (Right) HARDWARE REMOVAL KNEE (Right)     Patient location during evaluation: PACU Anesthesia Type: General Level of consciousness: awake and alert and patient cooperative Pain management: pain level controlled Vital Signs Assessment: post-procedure vital signs reviewed and stable Respiratory status: spontaneous breathing and respiratory function stable Cardiovascular status: stable Anesthetic complications: no    Last Vitals:  Vitals:   09/28/16 2154 09/29/16 0537  BP: 110/70 125/75  Pulse: (!) 57 (!) 55  Resp: 18 18  Temp: 36.9 C 36.4 C    Last Pain:  Vitals:   09/29/16 0748  TempSrc:   PainSc: Asleep                 Matina Rodier S

## 2016-10-01 LAB — BODY FLUID CULTURE: Culture: NO GROWTH

## 2016-10-02 LAB — AEROBIC/ANAEROBIC CULTURE W GRAM STAIN (SURGICAL/DEEP WOUND)

## 2016-10-02 LAB — AEROBIC/ANAEROBIC CULTURE (SURGICAL/DEEP WOUND): CULTURE: NO GROWTH

## 2016-10-15 ENCOUNTER — Encounter (HOSPITAL_COMMUNITY): Payer: Self-pay | Admitting: Emergency Medicine

## 2016-10-15 ENCOUNTER — Emergency Department (HOSPITAL_COMMUNITY)
Admission: EM | Admit: 2016-10-15 | Discharge: 2016-10-15 | Disposition: A | Discharge status: Home / Self Care | Payer: Self-pay | Attending: Emergency Medicine | Admitting: Emergency Medicine

## 2016-10-15 DIAGNOSIS — R300 Dysuria: Secondary | ICD-10-CM | POA: Insufficient documentation

## 2016-10-15 DIAGNOSIS — Z008 Encounter for other general examination: Secondary | ICD-10-CM | POA: Insufficient documentation

## 2016-10-15 DIAGNOSIS — F17210 Nicotine dependence, cigarettes, uncomplicated: Secondary | ICD-10-CM | POA: Insufficient documentation

## 2016-10-15 DIAGNOSIS — Z79899 Other long term (current) drug therapy: Secondary | ICD-10-CM | POA: Insufficient documentation

## 2016-10-15 DIAGNOSIS — D649 Anemia, unspecified: Secondary | ICD-10-CM | POA: Insufficient documentation

## 2016-10-15 LAB — COMPREHENSIVE METABOLIC PANEL
ALT: 13 U/L — ABNORMAL LOW (ref 14–54)
AST: 20 U/L (ref 15–41)
Albumin: 3.9 g/dL (ref 3.5–5.0)
Alkaline Phosphatase: 86 U/L (ref 38–126)
Anion gap: 9 (ref 5–15)
BUN: 15 mg/dL (ref 6–20)
CO2: 23 mmol/L (ref 22–32)
Calcium: 10 mg/dL (ref 8.9–10.3)
Chloride: 100 mmol/L — ABNORMAL LOW (ref 101–111)
Creatinine, Ser: 0.77 mg/dL (ref 0.44–1.00)
GFR calc Af Amer: >60 mL/min (ref >60)
GFR calc non Af Amer: >60 mL/min (ref >60)
Glucose, Bld: 127 mg/dL — ABNORMAL HIGH (ref 65–99)
Potassium: 3.8 mmol/L (ref 3.5–5.1)
Sodium: 132 mmol/L — ABNORMAL LOW (ref 135–145)
Total Bilirubin: 0.6 mg/dL (ref 0.3–1.2)
Total Protein: 8.4 g/dL — ABNORMAL HIGH (ref 6.5–8.1)

## 2016-10-15 LAB — URINALYSIS, ROUTINE W REFLEX MICROSCOPIC
Bilirubin Urine: NEGATIVE
Glucose, UA: NEGATIVE mg/dL
Hgb urine dipstick: NEGATIVE
Ketones, ur: NEGATIVE mg/dL
Leukocytes, UA: NEGATIVE
Nitrite: NEGATIVE
Protein, ur: NEGATIVE mg/dL
Specific Gravity, Urine: 1.001 — ABNORMAL LOW (ref 1.005–1.030)
pH: 7 (ref 5.0–8.0)

## 2016-10-15 LAB — CBC WITH DIFFERENTIAL/PLATELET
Basophils Absolute: 0 10*3/uL (ref 0.0–0.1)
Basophils Relative: 0 %
Eosinophils Absolute: 0 10*3/uL (ref 0.0–0.7)
Eosinophils Relative: 1 %
HCT: 38 % (ref 36.0–46.0)
Hemoglobin: 12.8 g/dL (ref 12.0–15.0)
Lymphocytes Relative: 48 %
Lymphs Abs: 3.2 10*3/uL (ref 0.7–4.0)
MCH: 29.9 pg (ref 26.0–34.0)
MCHC: 33.7 g/dL (ref 30.0–36.0)
MCV: 88.8 fL (ref 78.0–100.0)
Monocytes Absolute: 0.3 10*3/uL (ref 0.1–1.0)
Monocytes Relative: 5 %
Neutro Abs: 3.1 10*3/uL (ref 1.7–7.7)
Neutrophils Relative %: 46 %
Platelets: 366 10*3/uL (ref 150–400)
RBC: 4.28 MIL/uL (ref 3.87–5.11)
RDW: 17.3 % — ABNORMAL HIGH (ref 11.5–15.5)
WBC: 6.8 10*3/uL (ref 4.0–10.5)

## 2016-10-15 LAB — RAPID URINE DRUG SCREEN, HOSP PERFORMED
Amphetamines: NOT DETECTED
Barbiturates: NOT DETECTED
Benzodiazepines: NOT DETECTED
Cocaine: NOT DETECTED
Opiates: NOT DETECTED
Tetrahydrocannabinol: NOT DETECTED

## 2016-10-15 LAB — I-STAT BETA HCG BLOOD, ED (MC, WL, AP ONLY): I-stat hCG, quantitative: <5 m[IU]/mL (ref <5)

## 2016-10-15 LAB — I-STAT CG4 LACTIC ACID, ED: Lactic Acid, Venous: 1.75 mmol/L (ref 0.5–1.9)

## 2016-10-15 LAB — ETHANOL: Alcohol, Ethyl (B): <5 mg/dL (ref <5)

## 2016-10-15 NOTE — ED Provider Notes (Signed)
[facility]-EMERGENCY DEPT Provider Note   CSN: [id number] Arrival date & time: 10/15/16  1139     History   Chief Complaint Chief Complaint  Patient presents with  . Dysuria  . Medical Clearance    HPI Shannon Dawson is a 41 y.o. female.  HPI     41 year old female presents today for medical clearance from [facility].  Patient reports that she was hospitalized recently and was placed on opioids due to significant surgery.  [facility] will not allow her back in until she had no longer has opioids in her system.  Patient reports she does have slight burning with urination, denies any abdominal pain fever, or any other significant complaints.  Patient reports she feels at her baseline and is simply requesting medical clearance and drug screen. Patient denies any drug or alcohol use.    Past Medical History:  Diagnosis Date  . Anemia    hx  . Anxiety   . Chronic lower back pain   . Chronic pain of right knee    post knee injury 2011, pins and wires, rod  . Depression    hx  . GAD (generalized anxiety disorder)   . Headache(784.0)    otc med prn  . History of alcohol abuse    pt verbalized by pt no alcohol since 2011  . History of heroin abuse    per epic documentation and pt detoxed 2011--  verbalized by pt clean since 2011  . History of kidney stones   . Irregular menstrual cycle   . Nephrolithiasis    bilateral non-obstructive per renal u/s 05-27-2015  . OCD (obsessive compulsive disorder)   . Ovarian cyst    BILATERAL  . Renal cyst, right   . Right ureteral stone   . SVD (spontaneous vaginal delivery)    x 2    Patient Active Problem List   Diagnosis Date Noted  . Abscess of forearm, right   . Prepatellar bursitis, right knee   . Fungemia 09/21/2016  . Bacteremia 09/21/2016  . Cellulitis 09/20/2016  . Cellulitis of right arm   . Cellulitis of right knee   . Abscess of left forearm   . Fall   . Acute pain of right shoulder   . Ovarian cyst 11/21/2015  .  Post-operative state 11/21/2015  . Nephrolithiasis 05/19/2015  . OCD (obsessive compulsive disorder) 09/05/2011  . Bipolar disorder (HCC) 09/05/2011  . Generalized anxiety disorder 09/05/2011  . Opioid dependence (HCC) 08/29/2011  . Alcohol dependence (HCC) 08/29/2011    Past Surgical History:  Procedure Laterality Date  . CYSTO/  RIGHT URETEROSCOPIC LASER LITHOTRIPSY STONE EXTRACTION  03-14-2008  . CYSTOSCOPY WITH RETROGRADE PYELOGRAM, URETEROSCOPY AND STENT PLACEMENT Right 05/19/2015   Procedure: CYSTOSCOPY WITH RETROGRADE PYELOGRAM, URETEROSCOPY AND STENT PLACEMENT;  Surgeon: Malen GauzePatrick L McKenzie, MD;  Location: [facility];  Service: Urology;  Laterality: Right;  . CYSTOSCOPY WITH RETROGRADE PYELOGRAM, URETEROSCOPY AND STENT PLACEMENT Right 06/12/2015   Procedure: CYSTOSCOPY WITH RETROGRADE PYELOGRAM, URETEROSCOPY, STONE EXTRACTION AND STENT EXCHANGE;  Surgeon: Malen GauzePatrick L McKenzie, MD;  [facility];  Service: Urology;  Laterality: Right;  . HARDWARE REMOVAL Right 09/27/2016   Procedure: HARDWARE REMOVAL KNEE;  Surgeon: Sheral ApleyMurphy, Timothy D, MD;  [facility];  Service: Orthopedics;  Laterality: Right;  . HOLMIUM LASER APPLICATION Right 06/12/2015   Procedure: HOLMIUM LASER APPLICATION;  Surgeon: Malen GauzePatrick L McKenzie, MD;  [facility] [place];  Service: Urology;  Laterality: Right;  . I & D OPEN  RIGHT KNEE TIBIA/ FIBULA FX'S/  IM NAILING TIBIA/  CLOSURE TRAUMATIC WOUNDS  07-18-2009  . INCISION AND DRAINAGE ABSCESS Right 09/24/2016   Procedure: INCISION AND DRAINAGE ABSCESS RIGHT ARM;  Surgeon: Sheral ApleyMurphy, Timothy D, MD;  Location: [facility] OR;  Service: Orthopedics;  Laterality: Right;  . IRRIGATION AND DEBRIDEMENT KNEE Right 09/27/2016   Procedure: IRRIGATION AND DEBRIDEMENT PRE-PATELLER BURSA;  Surgeon: Sheral ApleyMurphy, Timothy D, MD;  [facility];  Service: Orthopedics;  Laterality: Right;  . LAPAROSCOPIC CHOLECYSTECTOMY  05-13-2009  . LAPAROTOMY Right 11/21/2015   Procedure:  LAPAROTOMY, RIght Salpingo Oophrectomy, Right Ovarian Cystectomy;  Surgeon: Willodean Rosenthalarolyn Harraway-Smith, MD;  Location: [facility];  Service: Gynecology;  Laterality: Right;  . MULTIPLE TOOTH EXTRACTIONS     cracked and chipped lower teeth, no upper teeth  . ORIF RIGHT PATELLA FX  2013  . TEE WITHOUT CARDIOVERSION N/A 09/24/2016   Procedure: TRANSESOPHAGEAL ECHOCARDIOGRAM (TEE);  Surgeon: Chrystie NoseHilty, Kenneth C, MD;  [facility];  Service: Cardiovascular;  Laterality: N/A;  . WISDOM TOOTH EXTRACTION      OB History    No data available       Home Medications    Prior to Admission medications   Medication Sig Start Date End Date Taking? Authorizing Provider  acetaminophen (TYLENOL) 500 MG tablet Take 1 tablet (500 mg total) by mouth every 6 (six) hours as needed (pain). 09/26/16   John Giovanniathore, Vasundhra, MD  diphenhydrAMINE (BENADRYL) 25 mg capsule Take 1 capsule (25 mg total) by mouth every 6 (six) hours as needed for itching. 09/26/16   John Giovanniathore, Vasundhra, MD  ibuprofen (ADVIL,MOTRIN) 400 MG tablet Take 1 tablet (400 mg total) by mouth every 6 (six) hours as needed for moderate pain. 09/26/16   John Giovanniathore, Vasundhra, MD  ibuprofen (ADVIL,MOTRIN) 800 MG tablet Take 1 tablet (800 mg total) by mouth every 8 (eight) hours as needed. 09/29/16   Levora DredgeHelberg, Justin, MD  oxyCODONE-acetaminophen (PERCOCET/ROXICET) 5-325 MG tablet Take 1-2 tablets by mouth every 6 (six) hours as needed for severe pain. 09/04/16   Benjiman CorePickering, Nathan, MD    Family History No family history on file.  Social History Social History  Substance Use Topics  . Smoking status: Current Every Day Smoker    Packs/day: 1.00    Years: 25.00    Types: Cigarettes  . Smokeless tobacco: Never Used  . Alcohol use No     Comment: hx alcohol abuse (per pt no alcohol since 2011)     Allergies   Tramadol and Vicodin [hydrocodone-acetaminophen]   Review of Systems Review of Systems  All other systems reviewed and are  negative.    Physical Exam Updated Vital Signs BP (!) 131/96   Pulse (!) 104   Temp 98.1 F (36.7 C) (Oral)   Resp 18   Ht 5\' 3"  (1.6 m)   Wt 68 kg (150 lb)   LMP 09/18/2016   SpO2 99%   BMI 26.57 kg/m   Physical Exam  Constitutional: She is oriented to person, place, and time. She appears well-developed and well-nourished.  HENT:  Head: Normocephalic and atraumatic.  Eyes: Pupils are equal, round, and reactive to light. Conjunctivae are normal. Right eye exhibits no discharge. Left eye exhibits no discharge. No scleral icterus.  Neck: Normal range of motion. No JVD present. No tracheal deviation present.  Pulmonary/Chest: Effort normal. No stridor.  Musculoskeletal:  Right upper extremity with scars no signs of infection  Right knee status post surgery with no signs of infection  Neurological: She is alert  and oriented to person, place, and time. Coordination normal.  Psychiatric: She has a normal mood and affect. Her behavior is normal. Judgment and thought content normal.  Nursing note and vitals reviewed.    ED Treatments / Results  Labs (all labs ordered are listed, but only abnormal results are displayed) Labs Reviewed  COMPREHENSIVE METABOLIC PANEL - Abnormal; Notable for the following:       Result Value   Sodium 132 (*)    Chloride 100 (*)    Glucose, Bld 127 (*)    Total Protein 8.4 (*)    ALT 13 (*)    All other components within normal limits  CBC WITH DIFFERENTIAL/PLATELET - Abnormal; Notable for the following:    RDW 17.3 (*)    All other components within normal limits  URINALYSIS, ROUTINE W REFLEX MICROSCOPIC - Abnormal; Notable for the following:    Color, Urine COLORLESS (*)    Specific Gravity, Urine 1.001 (*)    All other components within normal limits  ETHANOL  RAPID URINE DRUG SCREEN, HOSP PERFORMED  I-STAT CG4 LACTIC ACID, ED  I-STAT BETA HCG BLOOD, ED ([facility], [facility], [facility] ONLY)  I-STAT CG4 LACTIC ACID, ED    EKG  EKG Interpretation None        Radiology No results found.  Procedures Procedures (including critical care time)  Medications Ordered in ED Medications - No data to display   Initial Impression / Assessment and Plan / ED Course  I have reviewed the triage vital signs and the nursing notes.  Pertinent labs & imaging results that were available during my care of the patient were reviewed by me and considered in my medical decision making (see chart for details).      Final Clinical Impressions(s) / ED Diagnoses   Final diagnoses:  Encounter for medical clearance for patient hold    Patient here for medical clearance.  Well-appearing in no acute distress.  She is medically cleared and will be discharged back to rehab.  New Prescriptions New Prescriptions   No medications on file     Rosalio Loud 10/15/16 1615    Raeford Razor, MD 10/20/16 [phone]

## 2016-10-15 NOTE — Discharge Instructions (Signed)
Please read attached information. If you experience any new or worsening signs or symptoms please return to the emergency room for evaluation. Please follow-up with your primary care provider or specialist as discussed.  °

## 2016-10-15 NOTE — ED Triage Notes (Signed)
Pt. Is here for medical clearance to go to [facility].  They have requested.  She is also having Urinary symptoms. Urgency, frequency and dsyuria,  She has be hospitalized recently for Sepsis.  She does not want any Narcotics.  She is a IV drug user.Pt. Is alert and oriented X4, Skin is warm, pink and dry.

## 2016-10-17 LAB — CULTURE, FUNGUS WITHOUT SMEAR

## 2016-10-17 NOTE — ED Notes (Addendum)
Pt.s treatment center , [facility], and left a message on this RN's voicemail for a return call, today 10/17/2016.,  They needed information on Lab results.  I returned the call today, and [facility], reported that she obtained the information on 10/16/2016 from the Lab.

## 2016-12-16 DEATH — deceased

## 2017-02-22 IMAGING — CR DG ABDOMEN 1V
1 series · 1 of 1 positions shown · non-contrast
Comparison: None.

CLINICAL DATA: Right ureteral stent.

EXAM:
ABDOMEN - 1 VIEW

[t abdomen supine]
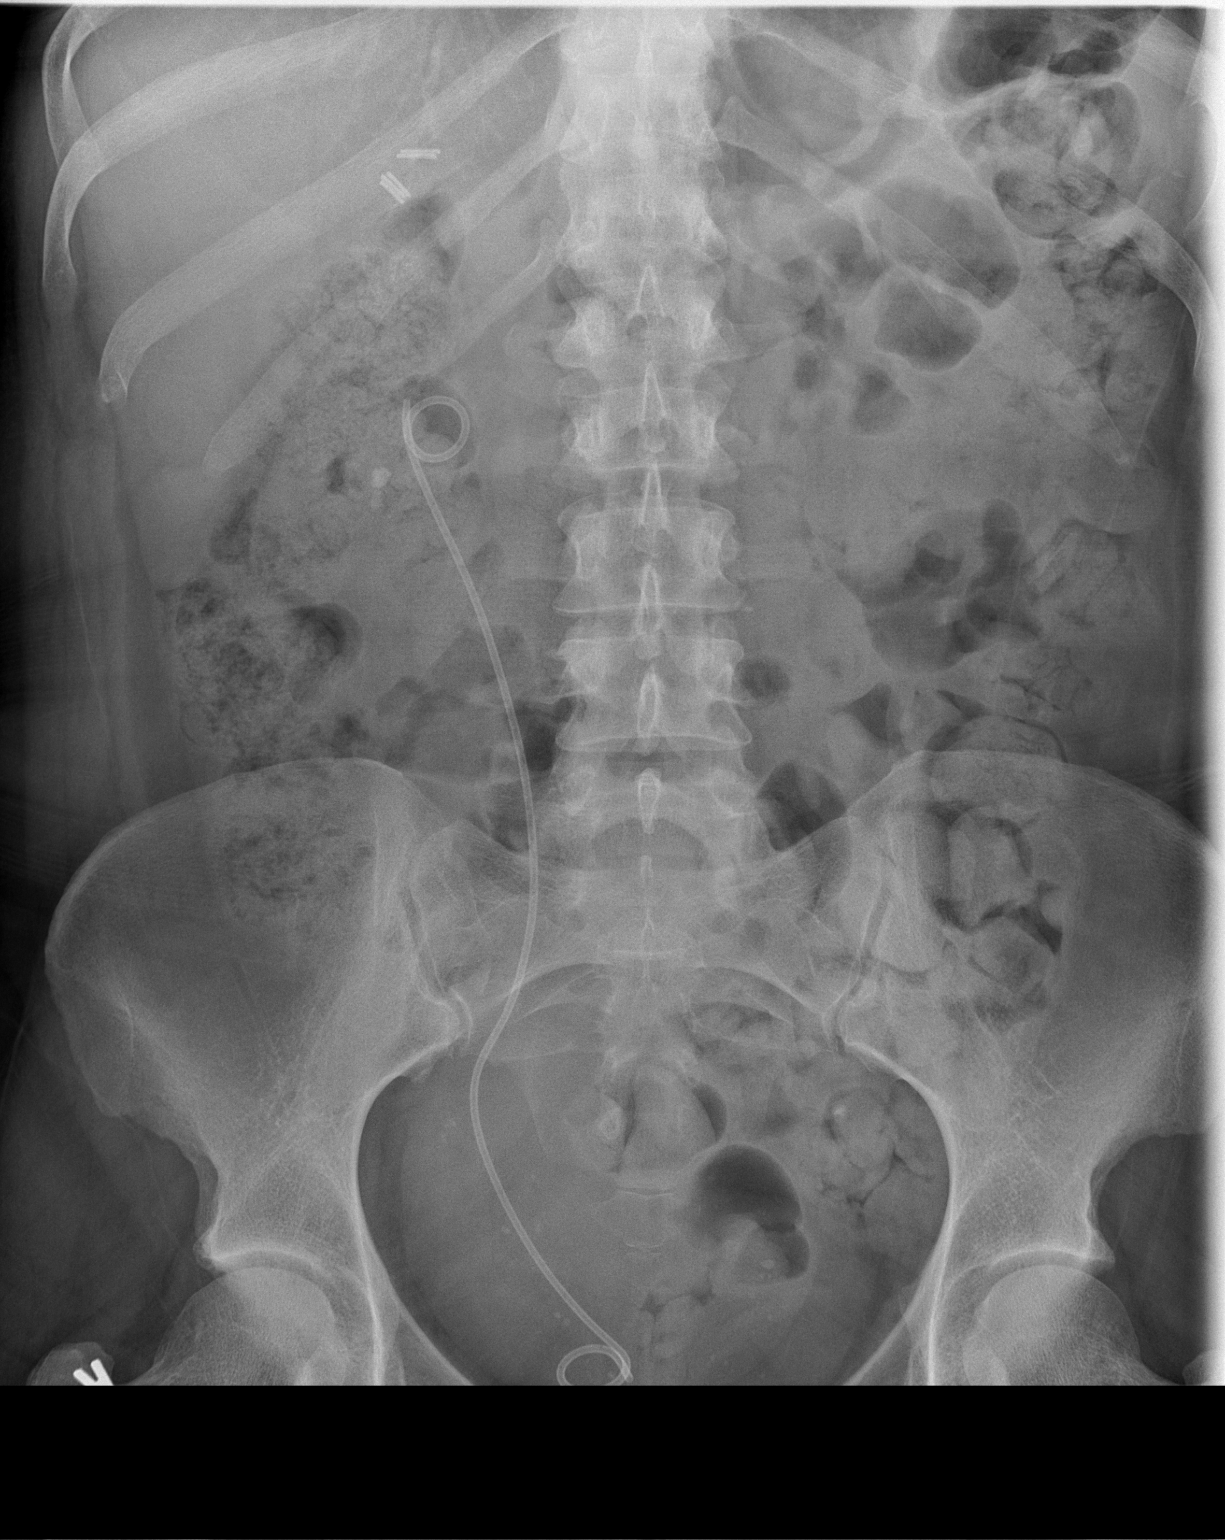

[1 of 1 positions shown; findings below may reference images not displayed]

FINDINGS: The bowel gas pattern is normal. New right ureteral stent is
identified in good position. Nonobstructing stones are identified in
the right kidney, largest measuring 5 mm. Extensive bowel content is
identified in the colon. Prior cholecystectomy clips are noted.
IMPRESSION: Right ureteral stent in good position. Right kidney stones as
described.

Constipation.

## 2018-06-14 IMAGING — DX DG CHEST 2V
2 series · 2 of 2 positions shown · non-contrast
Comparison: 07/19/2009

CLINICAL DATA: Right upper chest pain after fall last night.

EXAM:
CHEST  2 VIEW

[chest lat]
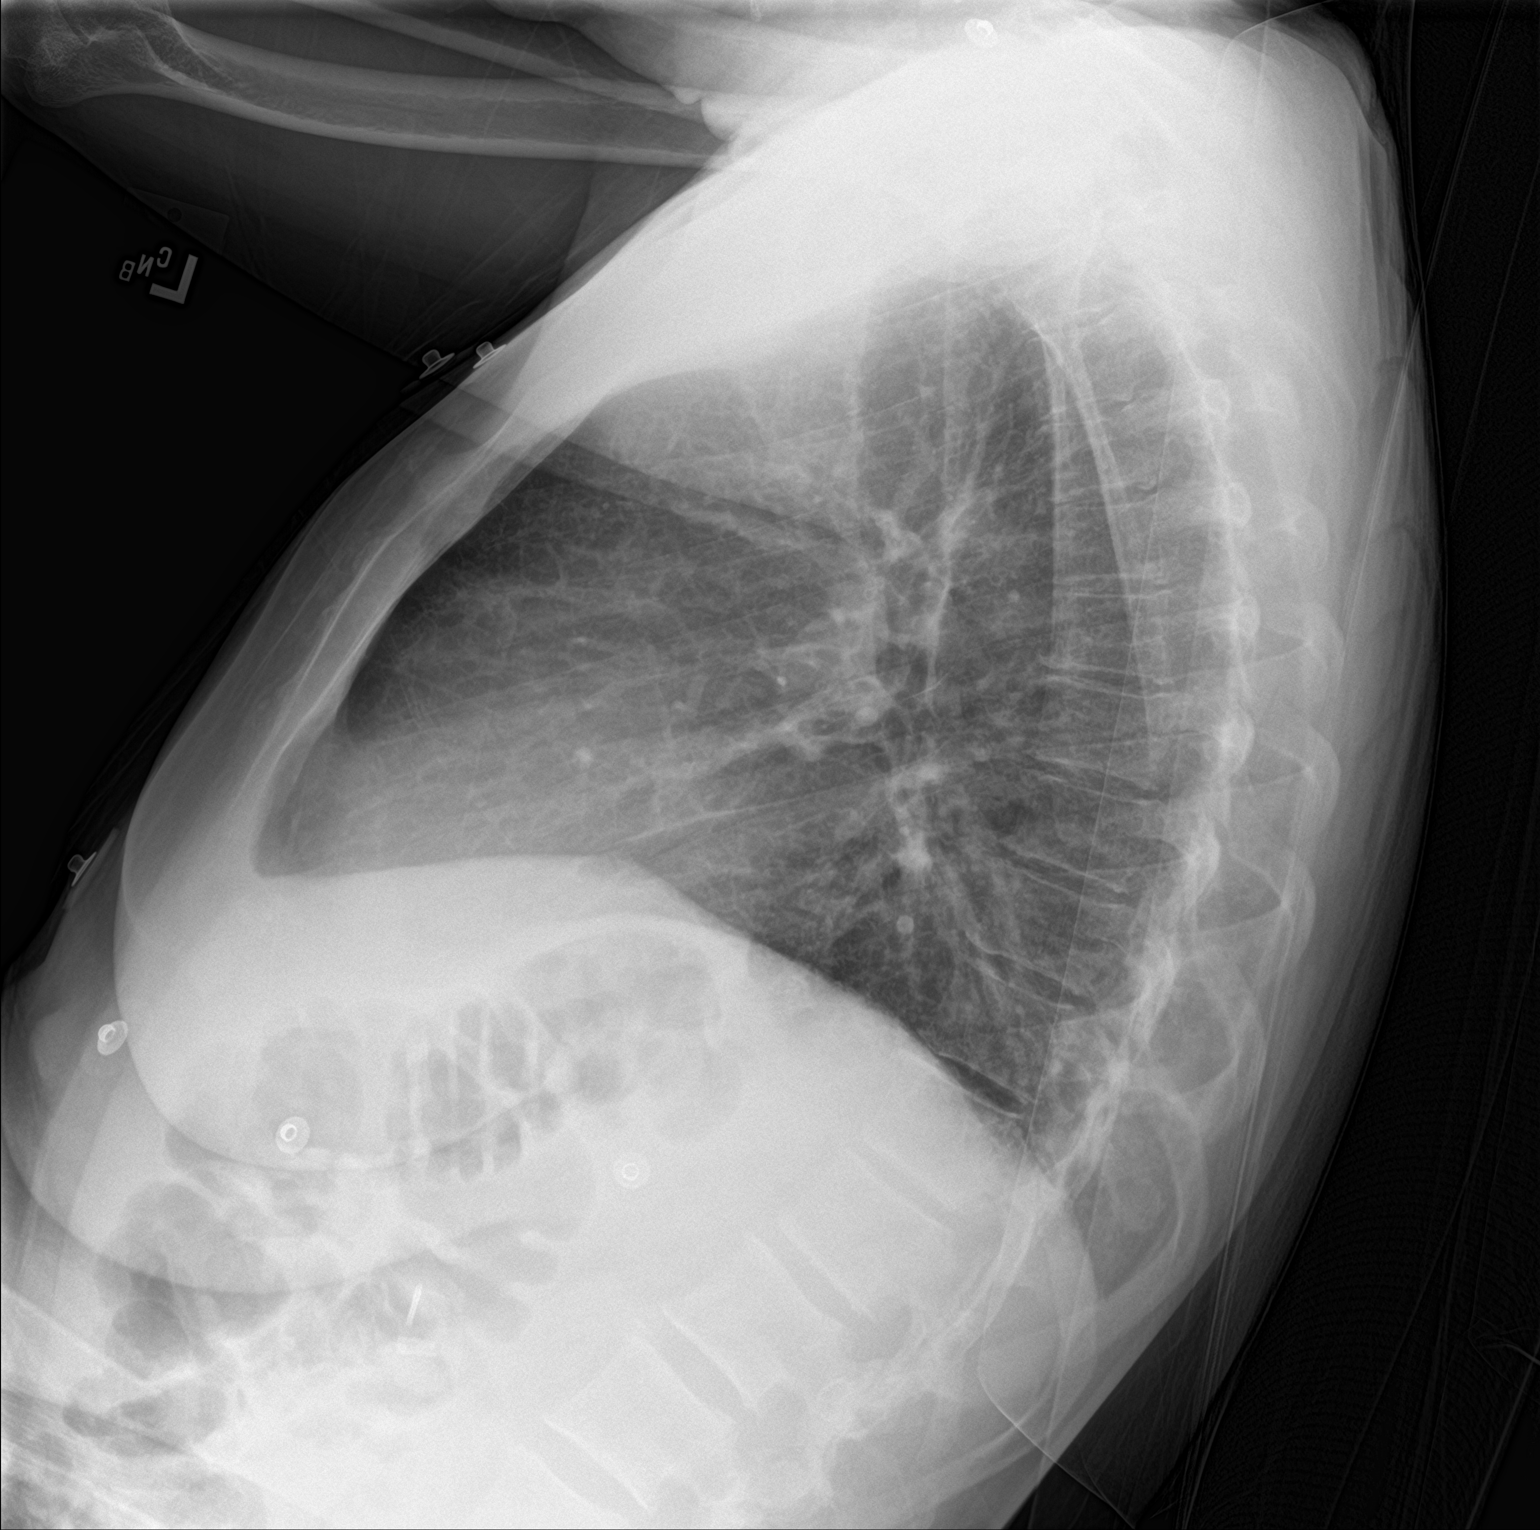

[chest ap]
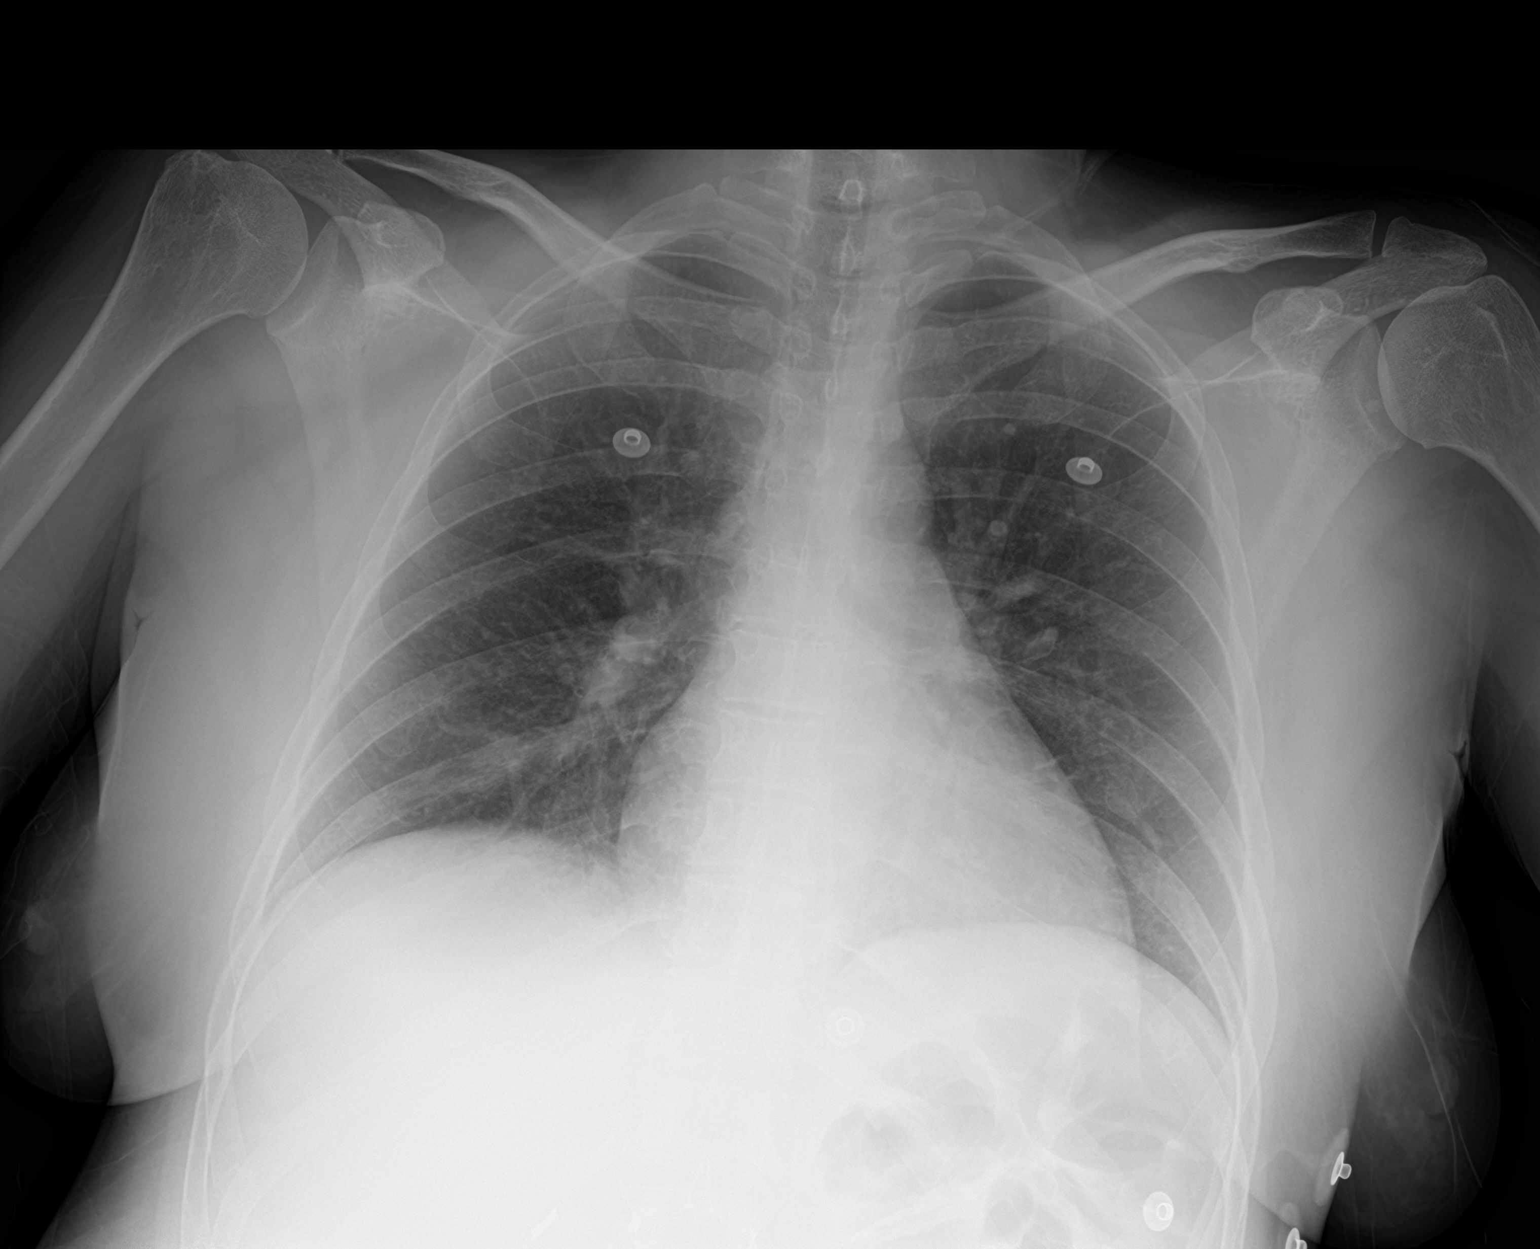

[2 of 2 positions shown; findings below may reference images not displayed]

FINDINGS: AP and lateral views of the chest show no focal airspace
consolidation. No pulmonary edema or pleural effusion. No
pneumothorax. The cardiopericardial silhouette is within normal
limits for size. The visualized bony structures of the thorax are
intact.
IMPRESSION: No active cardiopulmonary disease.

## 2018-06-28 IMAGING — US US EXTREM UP *R* LTD
1 series · 8 of 8 positions shown · non-contrast
Comparison: None.

CLINICAL DATA: Right forearm pain and swelling in an IV drug
abuser. Possible abscess.

EXAM:
ULTRASOUND RIGHT UPPER EXTREMITY LIMITED
TECHNIQUE: Ultrasound examination of the upper extremity soft tissues was
performed in the area of clinical concern.

[Series 1: us extrem up *right* ltd · 0.05mm/px · 8 of 8 slices shown]
[im 1/8]
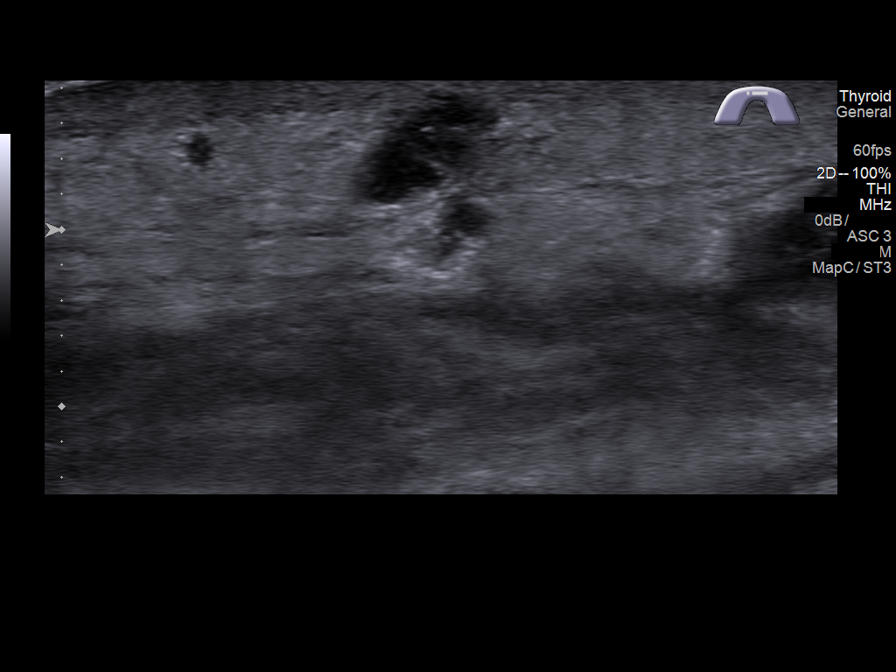
[im 2/8]
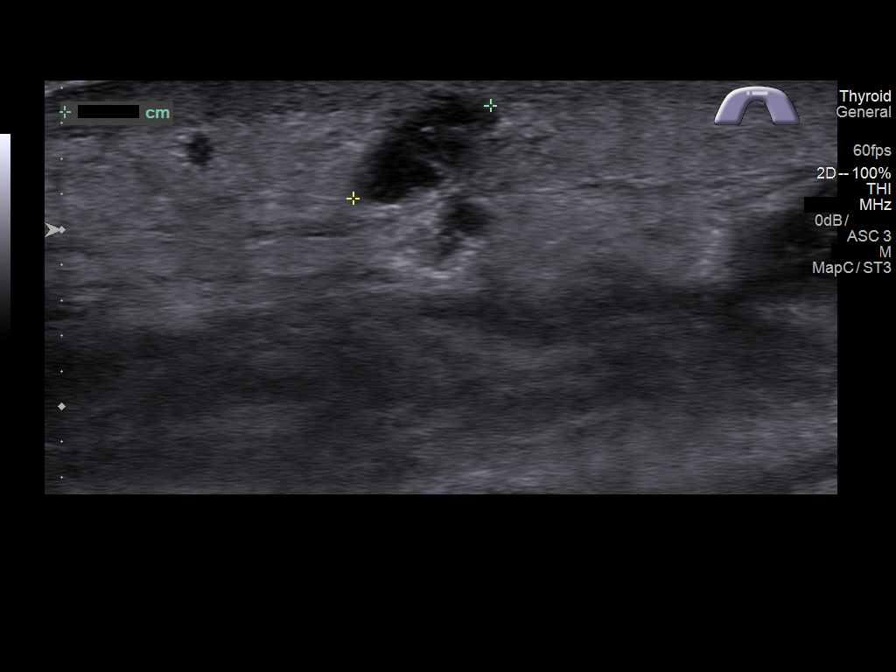
[im 3/8]
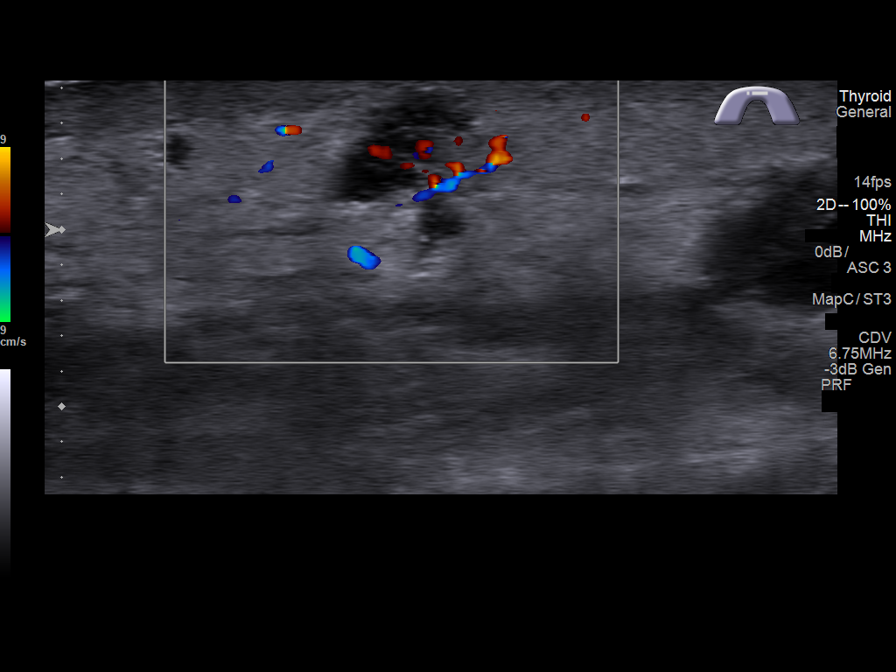
[im 4/8]
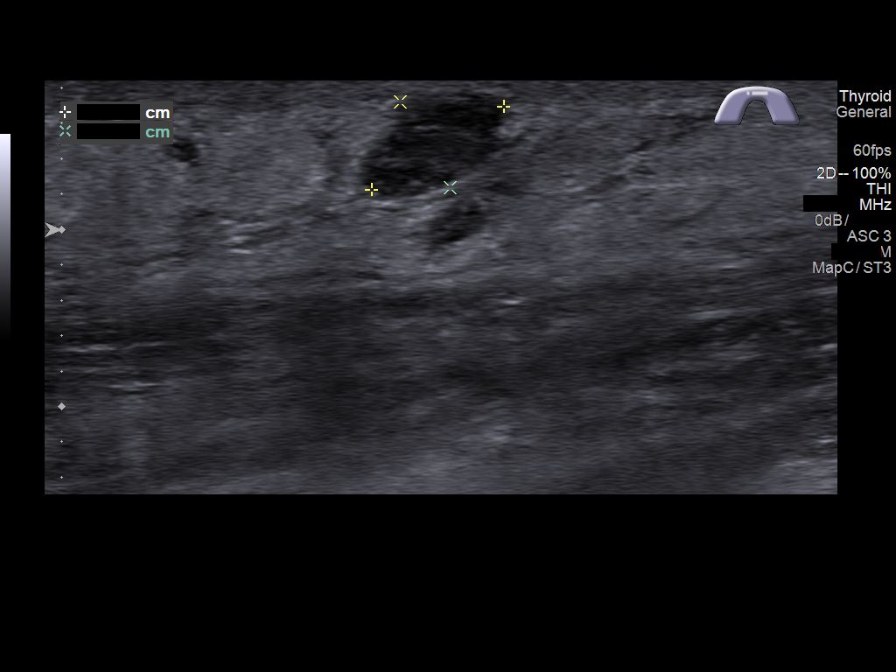
[im 5/8]
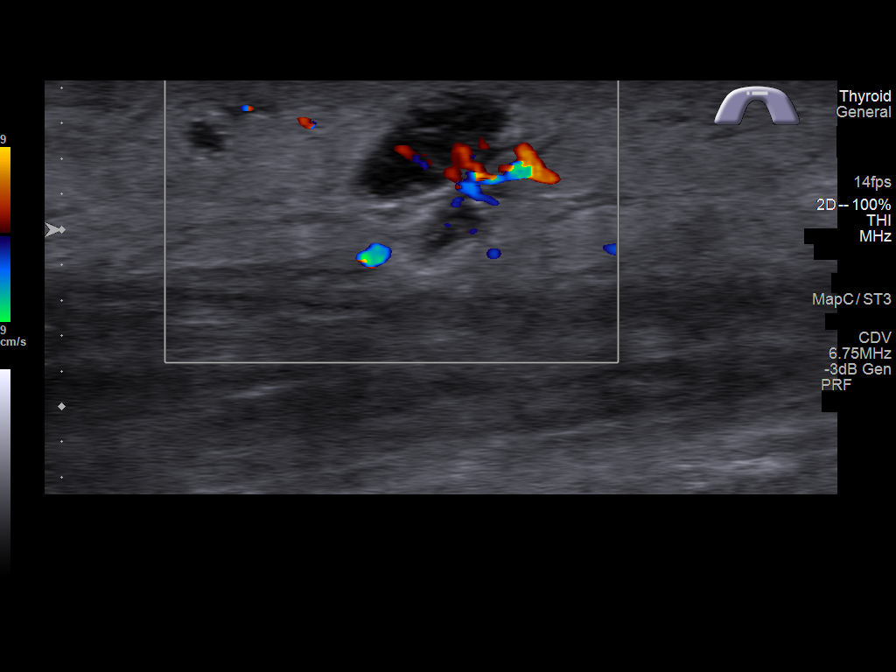
[im 6/8]
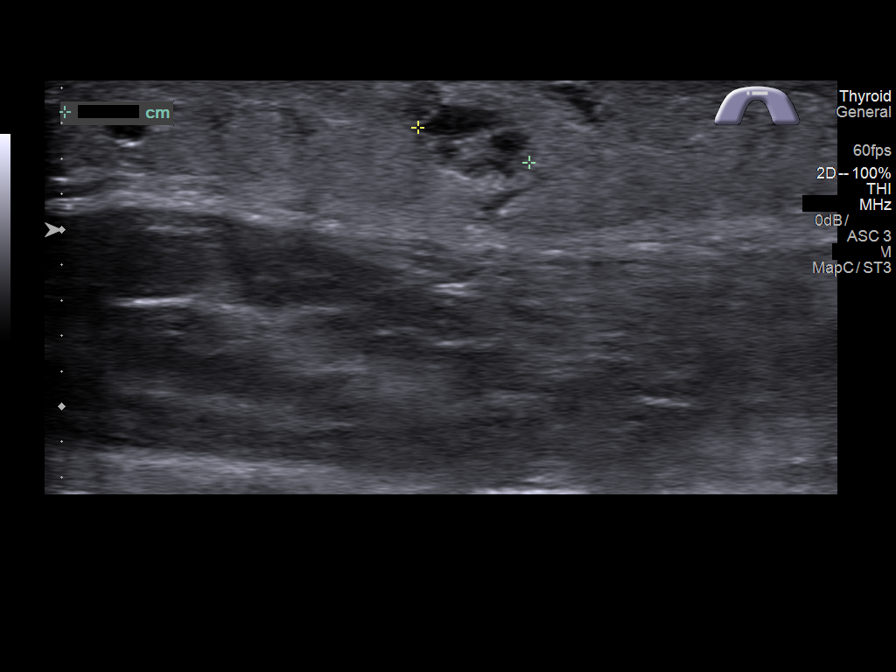
[im 7/8]
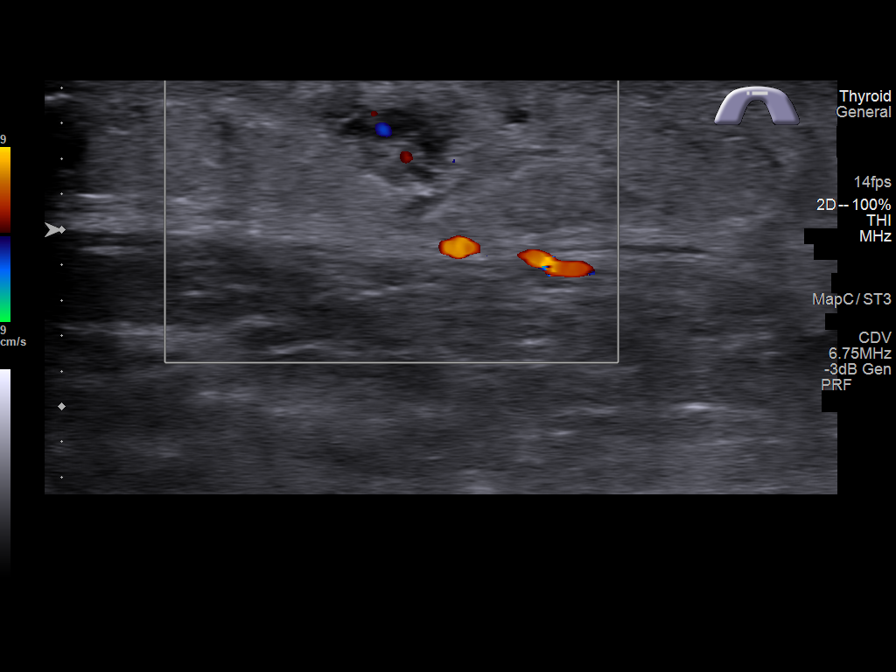
[im 8/8]
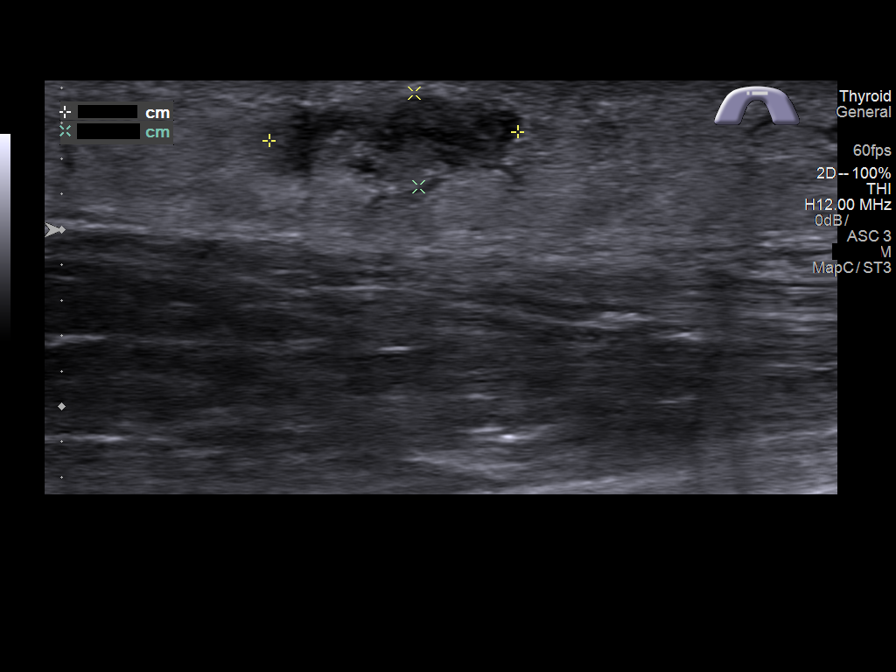

[8 of 8 positions shown; findings below may reference images not displayed]

FINDINGS: Scanning was directed to the region of concern along the anterior
aspect of the right forearm. An irregular subcutaneous lesion which
is predominantly hypoechoic measures 0.9 cm craniocaudal x 0.6 cm AP
x 0.7 cm transverse. There is some flow within the center of this
lesion on Doppler imaging. A second irregular lesion measuring 1.4 x
0.5 x 0.7 cm is also identified in the subcutaneous tissues which
also has some increased internal echoes and a small amount of flow.
No other abnormality is identified.
IMPRESSION: Two tiny collections in the subcutaneous tissues as described above
are most consistent with phlegmon or early abscess.
